# Patient Record
Sex: Male | Born: 1935
Health system: Southern US, Community
[De-identification: ages and names within clinical notes are randomized; demographics above are authoritative.]

## PROBLEM LIST (undated history)

## (undated) DIAGNOSIS — Z974 Presence of external hearing-aid: Secondary | ICD-10-CM

## (undated) DIAGNOSIS — I447 Left bundle-branch block, unspecified: Secondary | ICD-10-CM

## (undated) DIAGNOSIS — K219 Gastro-esophageal reflux disease without esophagitis: Secondary | ICD-10-CM

## (undated) DIAGNOSIS — R339 Retention of urine, unspecified: Secondary | ICD-10-CM

## (undated) DIAGNOSIS — I517 Cardiomegaly: Secondary | ICD-10-CM

## (undated) DIAGNOSIS — N200 Calculus of kidney: Secondary | ICD-10-CM

## (undated) DIAGNOSIS — E785 Hyperlipidemia, unspecified: Secondary | ICD-10-CM

## (undated) DIAGNOSIS — N400 Enlarged prostate without lower urinary tract symptoms: Secondary | ICD-10-CM

## (undated) DIAGNOSIS — F419 Anxiety disorder, unspecified: Secondary | ICD-10-CM

## (undated) DIAGNOSIS — M1990 Unspecified osteoarthritis, unspecified site: Secondary | ICD-10-CM

## (undated) DIAGNOSIS — F329 Major depressive disorder, single episode, unspecified: Secondary | ICD-10-CM

## (undated) DIAGNOSIS — E119 Type 2 diabetes mellitus without complications: Secondary | ICD-10-CM

## (undated) DIAGNOSIS — F988 Other specified behavioral and emotional disorders with onset usually occurring in childhood and adolescence: Secondary | ICD-10-CM

## (undated) DIAGNOSIS — N185 Chronic kidney disease, stage 5: Secondary | ICD-10-CM

## (undated) DIAGNOSIS — I519 Heart disease, unspecified: Secondary | ICD-10-CM

## (undated) DIAGNOSIS — R351 Nocturia: Secondary | ICD-10-CM

## (undated) DIAGNOSIS — F32A Depression, unspecified: Secondary | ICD-10-CM

## (undated) DIAGNOSIS — I10 Essential (primary) hypertension: Secondary | ICD-10-CM

## (undated) DIAGNOSIS — R600 Localized edema: Secondary | ICD-10-CM

## (undated) HISTORY — DX: Other specified behavioral and emotional disorders with onset usually occurring in childhood and adolescence: F98.8

## (undated) HISTORY — DX: Presence of external hearing-aid: Z97.4

## (undated) HISTORY — DX: Nocturia: R35.1

## (undated) HISTORY — PX: JOINT REPLACEMENT: SHX530

## (undated) HISTORY — DX: Benign prostatic hyperplasia without lower urinary tract symptoms: N40.0

## (undated) HISTORY — DX: Calculus of kidney: N20.0

---

## 1992-10-13 HISTORY — PX: OTHER SURGICAL HISTORY: SHX169

## 2016-11-07 DIAGNOSIS — R5383 Other fatigue: Secondary | ICD-10-CM | POA: Diagnosis not present

## 2016-11-07 DIAGNOSIS — I119 Hypertensive heart disease without heart failure: Secondary | ICD-10-CM | POA: Diagnosis not present

## 2016-11-07 DIAGNOSIS — E7800 Pure hypercholesterolemia, unspecified: Secondary | ICD-10-CM | POA: Diagnosis not present

## 2016-11-07 DIAGNOSIS — I6523 Occlusion and stenosis of bilateral carotid arteries: Secondary | ICD-10-CM | POA: Diagnosis not present

## 2016-11-07 DIAGNOSIS — I429 Cardiomyopathy, unspecified: Secondary | ICD-10-CM | POA: Diagnosis not present

## 2016-11-07 DIAGNOSIS — I350 Nonrheumatic aortic (valve) stenosis: Secondary | ICD-10-CM | POA: Diagnosis not present

## 2016-11-07 DIAGNOSIS — I10 Essential (primary) hypertension: Secondary | ICD-10-CM | POA: Diagnosis not present

## 2016-11-24 DIAGNOSIS — B351 Tinea unguium: Secondary | ICD-10-CM | POA: Diagnosis not present

## 2016-11-24 DIAGNOSIS — M79674 Pain in right toe(s): Secondary | ICD-10-CM | POA: Diagnosis not present

## 2016-11-24 DIAGNOSIS — M79675 Pain in left toe(s): Secondary | ICD-10-CM | POA: Diagnosis not present

## 2016-12-17 DIAGNOSIS — I119 Hypertensive heart disease without heart failure: Secondary | ICD-10-CM | POA: Diagnosis not present

## 2016-12-17 DIAGNOSIS — I6523 Occlusion and stenosis of bilateral carotid arteries: Secondary | ICD-10-CM | POA: Diagnosis not present

## 2016-12-17 DIAGNOSIS — R609 Edema, unspecified: Secondary | ICD-10-CM | POA: Diagnosis not present

## 2016-12-17 DIAGNOSIS — E7800 Pure hypercholesterolemia, unspecified: Secondary | ICD-10-CM | POA: Diagnosis not present

## 2016-12-17 DIAGNOSIS — I10 Essential (primary) hypertension: Secondary | ICD-10-CM | POA: Diagnosis not present

## 2016-12-17 DIAGNOSIS — E782 Mixed hyperlipidemia: Secondary | ICD-10-CM | POA: Diagnosis not present

## 2016-12-17 DIAGNOSIS — I350 Nonrheumatic aortic (valve) stenosis: Secondary | ICD-10-CM | POA: Diagnosis not present

## 2016-12-23 DIAGNOSIS — L570 Actinic keratosis: Secondary | ICD-10-CM | POA: Diagnosis not present

## 2016-12-23 DIAGNOSIS — D485 Neoplasm of uncertain behavior of skin: Secondary | ICD-10-CM | POA: Diagnosis not present

## 2017-01-05 DIAGNOSIS — L570 Actinic keratosis: Secondary | ICD-10-CM | POA: Diagnosis not present

## 2017-01-05 DIAGNOSIS — L299 Pruritus, unspecified: Secondary | ICD-10-CM | POA: Diagnosis not present

## 2017-01-05 DIAGNOSIS — L539 Erythematous condition, unspecified: Secondary | ICD-10-CM | POA: Diagnosis not present

## 2017-01-08 DIAGNOSIS — I119 Hypertensive heart disease without heart failure: Secondary | ICD-10-CM | POA: Diagnosis not present

## 2017-01-08 DIAGNOSIS — E119 Type 2 diabetes mellitus without complications: Secondary | ICD-10-CM | POA: Diagnosis not present

## 2017-01-08 DIAGNOSIS — Z6827 Body mass index (BMI) 27.0-27.9, adult: Secondary | ICD-10-CM | POA: Diagnosis not present

## 2017-01-08 DIAGNOSIS — I350 Nonrheumatic aortic (valve) stenosis: Secondary | ICD-10-CM | POA: Diagnosis not present

## 2017-01-08 DIAGNOSIS — I6523 Occlusion and stenosis of bilateral carotid arteries: Secondary | ICD-10-CM | POA: Diagnosis not present

## 2017-01-08 DIAGNOSIS — Z0001 Encounter for general adult medical examination with abnormal findings: Secondary | ICD-10-CM | POA: Diagnosis not present

## 2017-01-08 DIAGNOSIS — F331 Major depressive disorder, recurrent, moderate: Secondary | ICD-10-CM | POA: Diagnosis not present

## 2017-01-08 DIAGNOSIS — I10 Essential (primary) hypertension: Secondary | ICD-10-CM | POA: Diagnosis not present

## 2017-02-02 DIAGNOSIS — B078 Other viral warts: Secondary | ICD-10-CM | POA: Diagnosis not present

## 2017-02-02 DIAGNOSIS — M79671 Pain in right foot: Secondary | ICD-10-CM | POA: Diagnosis not present

## 2017-02-10 DIAGNOSIS — L570 Actinic keratosis: Secondary | ICD-10-CM | POA: Diagnosis not present

## 2017-04-13 DIAGNOSIS — M79675 Pain in left toe(s): Secondary | ICD-10-CM | POA: Diagnosis not present

## 2017-04-13 DIAGNOSIS — M79674 Pain in right toe(s): Secondary | ICD-10-CM | POA: Diagnosis not present

## 2017-04-13 DIAGNOSIS — B351 Tinea unguium: Secondary | ICD-10-CM | POA: Diagnosis not present

## 2017-04-13 DIAGNOSIS — I7389 Other specified peripheral vascular diseases: Secondary | ICD-10-CM | POA: Diagnosis not present

## 2017-04-22 DIAGNOSIS — I6523 Occlusion and stenosis of bilateral carotid arteries: Secondary | ICD-10-CM | POA: Diagnosis not present

## 2017-04-29 DIAGNOSIS — I6523 Occlusion and stenosis of bilateral carotid arteries: Secondary | ICD-10-CM | POA: Diagnosis not present

## 2017-04-29 DIAGNOSIS — I10 Essential (primary) hypertension: Secondary | ICD-10-CM | POA: Diagnosis not present

## 2017-04-29 DIAGNOSIS — E7800 Pure hypercholesterolemia, unspecified: Secondary | ICD-10-CM | POA: Diagnosis not present

## 2017-04-29 DIAGNOSIS — R0602 Shortness of breath: Secondary | ICD-10-CM | POA: Diagnosis not present

## 2017-04-29 DIAGNOSIS — I119 Hypertensive heart disease without heart failure: Secondary | ICD-10-CM | POA: Diagnosis not present

## 2017-04-29 DIAGNOSIS — I350 Nonrheumatic aortic (valve) stenosis: Secondary | ICD-10-CM | POA: Diagnosis not present

## 2017-04-29 DIAGNOSIS — Z6827 Body mass index (BMI) 27.0-27.9, adult: Secondary | ICD-10-CM | POA: Diagnosis not present

## 2017-04-29 DIAGNOSIS — R5383 Other fatigue: Secondary | ICD-10-CM | POA: Diagnosis not present

## 2017-04-30 DIAGNOSIS — I10 Essential (primary) hypertension: Secondary | ICD-10-CM | POA: Diagnosis not present

## 2017-04-30 DIAGNOSIS — R5383 Other fatigue: Secondary | ICD-10-CM | POA: Diagnosis not present

## 2017-04-30 DIAGNOSIS — E7800 Pure hypercholesterolemia, unspecified: Secondary | ICD-10-CM | POA: Diagnosis not present

## 2017-05-13 DIAGNOSIS — I10 Essential (primary) hypertension: Secondary | ICD-10-CM | POA: Diagnosis not present

## 2017-05-13 DIAGNOSIS — I350 Nonrheumatic aortic (valve) stenosis: Secondary | ICD-10-CM | POA: Diagnosis not present

## 2017-05-13 DIAGNOSIS — Z79899 Other long term (current) drug therapy: Secondary | ICD-10-CM | POA: Diagnosis not present

## 2017-05-13 DIAGNOSIS — I7389 Other specified peripheral vascular diseases: Secondary | ICD-10-CM | POA: Diagnosis not present

## 2017-05-13 DIAGNOSIS — I739 Peripheral vascular disease, unspecified: Secondary | ICD-10-CM | POA: Diagnosis not present

## 2017-05-13 DIAGNOSIS — I352 Nonrheumatic aortic (valve) stenosis with insufficiency: Secondary | ICD-10-CM | POA: Diagnosis not present

## 2017-05-13 DIAGNOSIS — R9439 Abnormal result of other cardiovascular function study: Secondary | ICD-10-CM | POA: Diagnosis not present

## 2017-06-03 DIAGNOSIS — I350 Nonrheumatic aortic (valve) stenosis: Secondary | ICD-10-CM | POA: Diagnosis not present

## 2017-06-08 DIAGNOSIS — Z6827 Body mass index (BMI) 27.0-27.9, adult: Secondary | ICD-10-CM | POA: Diagnosis not present

## 2017-06-08 DIAGNOSIS — I119 Hypertensive heart disease without heart failure: Secondary | ICD-10-CM | POA: Diagnosis not present

## 2017-06-08 DIAGNOSIS — R5383 Other fatigue: Secondary | ICD-10-CM | POA: Diagnosis not present

## 2017-06-08 DIAGNOSIS — I350 Nonrheumatic aortic (valve) stenosis: Secondary | ICD-10-CM | POA: Diagnosis not present

## 2017-06-08 DIAGNOSIS — I6523 Occlusion and stenosis of bilateral carotid arteries: Secondary | ICD-10-CM | POA: Diagnosis not present

## 2017-06-08 DIAGNOSIS — I340 Nonrheumatic mitral (valve) insufficiency: Secondary | ICD-10-CM | POA: Diagnosis not present

## 2017-06-17 DIAGNOSIS — Z87891 Personal history of nicotine dependence: Secondary | ICD-10-CM | POA: Diagnosis not present

## 2017-06-17 DIAGNOSIS — I350 Nonrheumatic aortic (valve) stenosis: Secondary | ICD-10-CM | POA: Diagnosis not present

## 2017-06-17 DIAGNOSIS — Z01810 Encounter for preprocedural cardiovascular examination: Secondary | ICD-10-CM | POA: Diagnosis not present

## 2017-06-17 DIAGNOSIS — Z01812 Encounter for preprocedural laboratory examination: Secondary | ICD-10-CM | POA: Diagnosis not present

## 2017-06-22 DIAGNOSIS — M79675 Pain in left toe(s): Secondary | ICD-10-CM | POA: Diagnosis not present

## 2017-06-22 DIAGNOSIS — B351 Tinea unguium: Secondary | ICD-10-CM | POA: Diagnosis not present

## 2017-06-22 DIAGNOSIS — M79674 Pain in right toe(s): Secondary | ICD-10-CM | POA: Diagnosis not present

## 2017-06-22 DIAGNOSIS — I7389 Other specified peripheral vascular diseases: Secondary | ICD-10-CM | POA: Diagnosis not present

## 2017-06-26 DIAGNOSIS — R739 Hyperglycemia, unspecified: Secondary | ICD-10-CM | POA: Diagnosis not present

## 2017-06-26 DIAGNOSIS — F418 Other specified anxiety disorders: Secondary | ICD-10-CM | POA: Diagnosis present

## 2017-06-26 DIAGNOSIS — F329 Major depressive disorder, single episode, unspecified: Secondary | ICD-10-CM | POA: Diagnosis not present

## 2017-06-26 DIAGNOSIS — N400 Enlarged prostate without lower urinary tract symptoms: Secondary | ICD-10-CM | POA: Diagnosis present

## 2017-06-26 DIAGNOSIS — I700 Atherosclerosis of aorta: Secondary | ICD-10-CM | POA: Diagnosis present

## 2017-06-26 DIAGNOSIS — F988 Other specified behavioral and emotional disorders with onset usually occurring in childhood and adolescence: Secondary | ICD-10-CM | POA: Diagnosis present

## 2017-06-26 DIAGNOSIS — Z87891 Personal history of nicotine dependence: Secondary | ICD-10-CM | POA: Diagnosis not present

## 2017-06-26 DIAGNOSIS — Z006 Encounter for examination for normal comparison and control in clinical research program: Secondary | ICD-10-CM | POA: Diagnosis not present

## 2017-06-26 DIAGNOSIS — I350 Nonrheumatic aortic (valve) stenosis: Secondary | ICD-10-CM | POA: Diagnosis present

## 2017-06-26 DIAGNOSIS — R079 Chest pain, unspecified: Secondary | ICD-10-CM | POA: Diagnosis not present

## 2017-06-26 DIAGNOSIS — R001 Bradycardia, unspecified: Secondary | ICD-10-CM | POA: Diagnosis not present

## 2017-06-26 DIAGNOSIS — E785 Hyperlipidemia, unspecified: Secondary | ICD-10-CM | POA: Diagnosis present

## 2017-06-26 DIAGNOSIS — I10 Essential (primary) hypertension: Secondary | ICD-10-CM | POA: Diagnosis present

## 2017-06-26 DIAGNOSIS — Z96641 Presence of right artificial hip joint: Secondary | ICD-10-CM | POA: Diagnosis present

## 2017-06-26 DIAGNOSIS — I509 Heart failure, unspecified: Secondary | ICD-10-CM | POA: Diagnosis not present

## 2017-06-26 DIAGNOSIS — J811 Chronic pulmonary edema: Secondary | ICD-10-CM | POA: Diagnosis not present

## 2017-06-26 DIAGNOSIS — I447 Left bundle-branch block, unspecified: Secondary | ICD-10-CM | POA: Diagnosis not present

## 2017-06-26 DIAGNOSIS — H9193 Unspecified hearing loss, bilateral: Secondary | ICD-10-CM | POA: Diagnosis present

## 2017-06-26 DIAGNOSIS — I110 Hypertensive heart disease with heart failure: Secondary | ICD-10-CM | POA: Diagnosis not present

## 2017-06-26 HISTORY — PX: AORTIC VALVE REPLACEMENT: SHX41

## 2017-07-07 DIAGNOSIS — Z952 Presence of prosthetic heart valve: Secondary | ICD-10-CM | POA: Diagnosis not present

## 2017-07-07 DIAGNOSIS — Z6826 Body mass index (BMI) 26.0-26.9, adult: Secondary | ICD-10-CM | POA: Diagnosis not present

## 2017-07-07 DIAGNOSIS — I119 Hypertensive heart disease without heart failure: Secondary | ICD-10-CM | POA: Diagnosis not present

## 2017-07-07 DIAGNOSIS — E7800 Pure hypercholesterolemia, unspecified: Secondary | ICD-10-CM | POA: Diagnosis not present

## 2017-07-07 DIAGNOSIS — I350 Nonrheumatic aortic (valve) stenosis: Secondary | ICD-10-CM | POA: Diagnosis not present

## 2017-07-10 DIAGNOSIS — I119 Hypertensive heart disease without heart failure: Secondary | ICD-10-CM | POA: Diagnosis not present

## 2017-07-10 DIAGNOSIS — I350 Nonrheumatic aortic (valve) stenosis: Secondary | ICD-10-CM | POA: Diagnosis not present

## 2017-07-10 DIAGNOSIS — Z6826 Body mass index (BMI) 26.0-26.9, adult: Secondary | ICD-10-CM | POA: Diagnosis not present

## 2017-07-10 DIAGNOSIS — R5383 Other fatigue: Secondary | ICD-10-CM | POA: Diagnosis not present

## 2017-07-10 DIAGNOSIS — Z952 Presence of prosthetic heart valve: Secondary | ICD-10-CM | POA: Diagnosis not present

## 2017-07-10 DIAGNOSIS — I340 Nonrheumatic mitral (valve) insufficiency: Secondary | ICD-10-CM | POA: Diagnosis not present

## 2017-07-10 DIAGNOSIS — E7800 Pure hypercholesterolemia, unspecified: Secondary | ICD-10-CM | POA: Diagnosis not present

## 2017-07-10 DIAGNOSIS — N189 Chronic kidney disease, unspecified: Secondary | ICD-10-CM | POA: Diagnosis not present

## 2017-07-27 DIAGNOSIS — Z125 Encounter for screening for malignant neoplasm of prostate: Secondary | ICD-10-CM | POA: Diagnosis not present

## 2017-07-29 DIAGNOSIS — R3912 Poor urinary stream: Secondary | ICD-10-CM | POA: Diagnosis not present

## 2017-07-29 DIAGNOSIS — K5900 Constipation, unspecified: Secondary | ICD-10-CM | POA: Diagnosis not present

## 2017-07-29 DIAGNOSIS — Z125 Encounter for screening for malignant neoplasm of prostate: Secondary | ICD-10-CM | POA: Diagnosis not present

## 2017-07-30 DIAGNOSIS — I340 Nonrheumatic mitral (valve) insufficiency: Secondary | ICD-10-CM | POA: Diagnosis not present

## 2017-07-30 DIAGNOSIS — I350 Nonrheumatic aortic (valve) stenosis: Secondary | ICD-10-CM | POA: Diagnosis not present

## 2017-07-30 DIAGNOSIS — R0602 Shortness of breath: Secondary | ICD-10-CM | POA: Diagnosis not present

## 2017-07-30 DIAGNOSIS — R079 Chest pain, unspecified: Secondary | ICD-10-CM | POA: Diagnosis not present

## 2017-08-12 DIAGNOSIS — R3912 Poor urinary stream: Secondary | ICD-10-CM | POA: Diagnosis not present

## 2017-08-12 DIAGNOSIS — Z125 Encounter for screening for malignant neoplasm of prostate: Secondary | ICD-10-CM | POA: Diagnosis not present

## 2017-08-12 DIAGNOSIS — N401 Enlarged prostate with lower urinary tract symptoms: Secondary | ICD-10-CM | POA: Diagnosis not present

## 2017-08-17 DIAGNOSIS — I350 Nonrheumatic aortic (valve) stenosis: Secondary | ICD-10-CM | POA: Diagnosis not present

## 2017-08-17 DIAGNOSIS — Z6826 Body mass index (BMI) 26.0-26.9, adult: Secondary | ICD-10-CM | POA: Diagnosis not present

## 2017-08-17 DIAGNOSIS — Z952 Presence of prosthetic heart valve: Secondary | ICD-10-CM | POA: Diagnosis not present

## 2017-08-17 DIAGNOSIS — I119 Hypertensive heart disease without heart failure: Secondary | ICD-10-CM | POA: Diagnosis not present

## 2017-08-17 DIAGNOSIS — E7800 Pure hypercholesterolemia, unspecified: Secondary | ICD-10-CM | POA: Diagnosis not present

## 2017-08-17 DIAGNOSIS — I340 Nonrheumatic mitral (valve) insufficiency: Secondary | ICD-10-CM | POA: Diagnosis not present

## 2017-08-17 DIAGNOSIS — R0602 Shortness of breath: Secondary | ICD-10-CM | POA: Diagnosis not present

## 2017-08-17 DIAGNOSIS — I6523 Occlusion and stenosis of bilateral carotid arteries: Secondary | ICD-10-CM | POA: Diagnosis not present

## 2017-08-17 DIAGNOSIS — N189 Chronic kidney disease, unspecified: Secondary | ICD-10-CM | POA: Diagnosis not present

## 2017-09-14 DIAGNOSIS — B351 Tinea unguium: Secondary | ICD-10-CM | POA: Diagnosis not present

## 2017-09-14 DIAGNOSIS — M79674 Pain in right toe(s): Secondary | ICD-10-CM | POA: Diagnosis not present

## 2017-09-14 DIAGNOSIS — M79675 Pain in left toe(s): Secondary | ICD-10-CM | POA: Diagnosis not present

## 2017-10-15 DIAGNOSIS — E782 Mixed hyperlipidemia: Secondary | ICD-10-CM | POA: Diagnosis not present

## 2017-10-15 DIAGNOSIS — Z6826 Body mass index (BMI) 26.0-26.9, adult: Secondary | ICD-10-CM | POA: Diagnosis not present

## 2017-10-15 DIAGNOSIS — I10 Essential (primary) hypertension: Secondary | ICD-10-CM | POA: Diagnosis not present

## 2017-10-15 DIAGNOSIS — R296 Repeated falls: Secondary | ICD-10-CM | POA: Diagnosis not present

## 2017-10-15 DIAGNOSIS — E119 Type 2 diabetes mellitus without complications: Secondary | ICD-10-CM | POA: Diagnosis not present

## 2017-11-13 DIAGNOSIS — E119 Type 2 diabetes mellitus without complications: Secondary | ICD-10-CM | POA: Diagnosis not present

## 2017-11-13 DIAGNOSIS — R2689 Other abnormalities of gait and mobility: Secondary | ICD-10-CM | POA: Diagnosis not present

## 2017-11-16 DIAGNOSIS — E119 Type 2 diabetes mellitus without complications: Secondary | ICD-10-CM | POA: Diagnosis not present

## 2017-11-16 DIAGNOSIS — R2689 Other abnormalities of gait and mobility: Secondary | ICD-10-CM | POA: Diagnosis not present

## 2017-11-18 DIAGNOSIS — R2689 Other abnormalities of gait and mobility: Secondary | ICD-10-CM | POA: Diagnosis not present

## 2017-11-18 DIAGNOSIS — E119 Type 2 diabetes mellitus without complications: Secondary | ICD-10-CM | POA: Diagnosis not present

## 2017-11-20 DIAGNOSIS — I10 Essential (primary) hypertension: Secondary | ICD-10-CM | POA: Diagnosis not present

## 2017-11-20 DIAGNOSIS — Z6826 Body mass index (BMI) 26.0-26.9, adult: Secondary | ICD-10-CM | POA: Diagnosis not present

## 2017-11-20 DIAGNOSIS — E119 Type 2 diabetes mellitus without complications: Secondary | ICD-10-CM | POA: Diagnosis not present

## 2017-11-20 DIAGNOSIS — F3340 Major depressive disorder, recurrent, in remission, unspecified: Secondary | ICD-10-CM | POA: Diagnosis not present

## 2017-11-20 DIAGNOSIS — E782 Mixed hyperlipidemia: Secondary | ICD-10-CM | POA: Diagnosis not present

## 2017-11-20 DIAGNOSIS — Z952 Presence of prosthetic heart valve: Secondary | ICD-10-CM | POA: Diagnosis not present

## 2017-11-23 DIAGNOSIS — M79675 Pain in left toe(s): Secondary | ICD-10-CM | POA: Diagnosis not present

## 2017-11-23 DIAGNOSIS — E119 Type 2 diabetes mellitus without complications: Secondary | ICD-10-CM | POA: Diagnosis not present

## 2017-11-23 DIAGNOSIS — Z713 Dietary counseling and surveillance: Secondary | ICD-10-CM | POA: Diagnosis not present

## 2017-11-23 DIAGNOSIS — B351 Tinea unguium: Secondary | ICD-10-CM | POA: Diagnosis not present

## 2017-11-23 DIAGNOSIS — R2689 Other abnormalities of gait and mobility: Secondary | ICD-10-CM | POA: Diagnosis not present

## 2017-11-23 DIAGNOSIS — M79674 Pain in right toe(s): Secondary | ICD-10-CM | POA: Diagnosis not present

## 2017-11-23 DIAGNOSIS — D485 Neoplasm of uncertain behavior of skin: Secondary | ICD-10-CM | POA: Diagnosis not present

## 2017-11-23 DIAGNOSIS — I7389 Other specified peripheral vascular diseases: Secondary | ICD-10-CM | POA: Diagnosis not present

## 2017-11-25 DIAGNOSIS — R2689 Other abnormalities of gait and mobility: Secondary | ICD-10-CM | POA: Diagnosis not present

## 2017-11-25 DIAGNOSIS — E119 Type 2 diabetes mellitus without complications: Secondary | ICD-10-CM | POA: Diagnosis not present

## 2017-12-02 DIAGNOSIS — R2689 Other abnormalities of gait and mobility: Secondary | ICD-10-CM | POA: Diagnosis not present

## 2017-12-02 DIAGNOSIS — E119 Type 2 diabetes mellitus without complications: Secondary | ICD-10-CM | POA: Diagnosis not present

## 2017-12-02 DIAGNOSIS — L570 Actinic keratosis: Secondary | ICD-10-CM | POA: Diagnosis not present

## 2017-12-21 DIAGNOSIS — Z48817 Encounter for surgical aftercare following surgery on the skin and subcutaneous tissue: Secondary | ICD-10-CM | POA: Diagnosis not present

## 2018-01-15 DIAGNOSIS — N401 Enlarged prostate with lower urinary tract symptoms: Secondary | ICD-10-CM | POA: Diagnosis not present

## 2018-01-15 DIAGNOSIS — Z952 Presence of prosthetic heart valve: Secondary | ICD-10-CM | POA: Diagnosis not present

## 2018-01-15 DIAGNOSIS — Z974 Presence of external hearing-aid: Secondary | ICD-10-CM | POA: Diagnosis not present

## 2018-01-15 DIAGNOSIS — E785 Hyperlipidemia, unspecified: Secondary | ICD-10-CM | POA: Diagnosis not present

## 2018-01-15 DIAGNOSIS — R81 Glycosuria: Secondary | ICD-10-CM | POA: Diagnosis not present

## 2018-01-15 DIAGNOSIS — R296 Repeated falls: Secondary | ICD-10-CM | POA: Diagnosis not present

## 2018-01-15 DIAGNOSIS — H6123 Impacted cerumen, bilateral: Secondary | ICD-10-CM | POA: Diagnosis not present

## 2018-01-15 DIAGNOSIS — F988 Other specified behavioral and emotional disorders with onset usually occurring in childhood and adolescence: Secondary | ICD-10-CM | POA: Diagnosis not present

## 2018-01-21 DIAGNOSIS — Z952 Presence of prosthetic heart valve: Secondary | ICD-10-CM | POA: Diagnosis not present

## 2018-01-21 DIAGNOSIS — N401 Enlarged prostate with lower urinary tract symptoms: Secondary | ICD-10-CM | POA: Diagnosis not present

## 2018-01-21 DIAGNOSIS — R260 Ataxic gait: Secondary | ICD-10-CM | POA: Diagnosis not present

## 2018-01-21 DIAGNOSIS — E119 Type 2 diabetes mellitus without complications: Secondary | ICD-10-CM | POA: Diagnosis not present

## 2018-01-21 DIAGNOSIS — Z7984 Long term (current) use of oral hypoglycemic drugs: Secondary | ICD-10-CM | POA: Diagnosis not present

## 2018-01-21 DIAGNOSIS — R296 Repeated falls: Secondary | ICD-10-CM | POA: Diagnosis not present

## 2018-01-21 DIAGNOSIS — F988 Other specified behavioral and emotional disorders with onset usually occurring in childhood and adolescence: Secondary | ICD-10-CM | POA: Diagnosis not present

## 2018-01-21 DIAGNOSIS — R351 Nocturia: Secondary | ICD-10-CM | POA: Diagnosis not present

## 2018-01-25 DIAGNOSIS — E119 Type 2 diabetes mellitus without complications: Secondary | ICD-10-CM | POA: Diagnosis not present

## 2018-01-25 DIAGNOSIS — N401 Enlarged prostate with lower urinary tract symptoms: Secondary | ICD-10-CM | POA: Diagnosis not present

## 2018-01-25 DIAGNOSIS — F988 Other specified behavioral and emotional disorders with onset usually occurring in childhood and adolescence: Secondary | ICD-10-CM | POA: Diagnosis not present

## 2018-01-25 DIAGNOSIS — R260 Ataxic gait: Secondary | ICD-10-CM | POA: Diagnosis not present

## 2018-01-25 DIAGNOSIS — R296 Repeated falls: Secondary | ICD-10-CM | POA: Diagnosis not present

## 2018-01-25 DIAGNOSIS — R351 Nocturia: Secondary | ICD-10-CM | POA: Diagnosis not present

## 2018-01-27 DIAGNOSIS — E119 Type 2 diabetes mellitus without complications: Secondary | ICD-10-CM | POA: Diagnosis not present

## 2018-01-27 DIAGNOSIS — R296 Repeated falls: Secondary | ICD-10-CM | POA: Diagnosis not present

## 2018-01-27 DIAGNOSIS — F988 Other specified behavioral and emotional disorders with onset usually occurring in childhood and adolescence: Secondary | ICD-10-CM | POA: Diagnosis not present

## 2018-01-27 DIAGNOSIS — R260 Ataxic gait: Secondary | ICD-10-CM | POA: Diagnosis not present

## 2018-01-27 DIAGNOSIS — R351 Nocturia: Secondary | ICD-10-CM | POA: Diagnosis not present

## 2018-01-27 DIAGNOSIS — N401 Enlarged prostate with lower urinary tract symptoms: Secondary | ICD-10-CM | POA: Diagnosis not present

## 2018-01-29 DIAGNOSIS — R319 Hematuria, unspecified: Secondary | ICD-10-CM | POA: Insufficient documentation

## 2018-01-29 DIAGNOSIS — Z952 Presence of prosthetic heart valve: Secondary | ICD-10-CM

## 2018-01-29 DIAGNOSIS — W19XXXA Unspecified fall, initial encounter: Secondary | ICD-10-CM | POA: Insufficient documentation

## 2018-01-29 DIAGNOSIS — Z09 Encounter for follow-up examination after completed treatment for conditions other than malignant neoplasm: Secondary | ICD-10-CM | POA: Insufficient documentation

## 2018-02-01 DIAGNOSIS — R351 Nocturia: Secondary | ICD-10-CM | POA: Diagnosis not present

## 2018-02-01 DIAGNOSIS — F988 Other specified behavioral and emotional disorders with onset usually occurring in childhood and adolescence: Secondary | ICD-10-CM | POA: Diagnosis not present

## 2018-02-01 DIAGNOSIS — E119 Type 2 diabetes mellitus without complications: Secondary | ICD-10-CM | POA: Diagnosis not present

## 2018-02-01 DIAGNOSIS — R260 Ataxic gait: Secondary | ICD-10-CM | POA: Diagnosis not present

## 2018-02-01 DIAGNOSIS — N401 Enlarged prostate with lower urinary tract symptoms: Secondary | ICD-10-CM | POA: Diagnosis not present

## 2018-02-01 DIAGNOSIS — R296 Repeated falls: Secondary | ICD-10-CM | POA: Diagnosis not present

## 2018-02-03 DIAGNOSIS — R351 Nocturia: Secondary | ICD-10-CM | POA: Diagnosis not present

## 2018-02-03 DIAGNOSIS — R296 Repeated falls: Secondary | ICD-10-CM | POA: Diagnosis not present

## 2018-02-03 DIAGNOSIS — R260 Ataxic gait: Secondary | ICD-10-CM | POA: Diagnosis not present

## 2018-02-03 DIAGNOSIS — E119 Type 2 diabetes mellitus without complications: Secondary | ICD-10-CM | POA: Diagnosis not present

## 2018-02-03 DIAGNOSIS — F988 Other specified behavioral and emotional disorders with onset usually occurring in childhood and adolescence: Secondary | ICD-10-CM | POA: Diagnosis not present

## 2018-02-03 DIAGNOSIS — N401 Enlarged prostate with lower urinary tract symptoms: Secondary | ICD-10-CM | POA: Diagnosis not present

## 2018-02-04 ENCOUNTER — Ambulatory Visit (INDEPENDENT_AMBULATORY_CARE_PROVIDER_SITE_OTHER): Payer: Medicare Other | Admitting: Cardiology

## 2018-02-04 ENCOUNTER — Encounter: Payer: Self-pay | Admitting: Cardiology

## 2018-02-04 VITALS — BP 132/70 | HR 61 | Ht 67.0 in | Wt 175.0 lb

## 2018-02-04 DIAGNOSIS — Z953 Presence of xenogenic heart valve: Secondary | ICD-10-CM

## 2018-02-04 DIAGNOSIS — R600 Localized edema: Secondary | ICD-10-CM

## 2018-02-04 NOTE — Progress Notes (Signed)
Cardiology Office Note:    Date:  02/04/2018   ID:  [name], DOB 10/20/1925, MRN [id number]  PCP:  [facility]  Cardiologist:  Jenean Lindau, MD   Referring MD: Starlyn Skeans, PA-C    ASSESSMENT:    1. S/p TAVR (transcatheter aortic valve replacement), bioprosthetic   2. Pedal edema    PLAN:    In order of problems listed above:  1. Overall patient is doing fine post TAVR.  He has moved here from the wants to get established.  He mentions to me that he was doctors wanted a follow-up echocardiogram to assess the aortic management we will obtain this.  He also has bilateral pedal edema left greater than the right and therefore we will do a DVT study though he says this is been going on for the past 2 or 3 years.  His blood pressure is stable and I discussed diabetes mellitus.  Fall precautions were advised.  6 months.   Medication Adjustments/Labs and Tests Ordered: Current medicines are reviewed at length with the patient today.  Concerns regarding medicines are outlined above.  Orders Placed This Encounter  Procedures  . ECHOCARDIOGRAM COMPLETE   No orders of the defined types were placed in this encounter.    History of Present Illness:    Trampus Mcquerry is a 84 y.o. male who is being seen today for the evaluation of post TAVR at the request of [place], [place], [place].  The patient wishes to be established and is brought in by his stepson.  Patient denies any problems at this time and leads a sedentary lifestyle.  He is just moved here from [place].  He has issues with his balance and has had multiple falls.  No chest pain orthopnea or PND.  At the time of my evaluation, the patient is alert awake oriented and in no distress.  Past Medical History:  Diagnosis Date  . ADD (attention deficit disorder)   . BPH (benign prostatic hyperplasia)   . Does use hearing aid   . Kidney stones   . Nocturia     Past Surgical History:  Procedure  Laterality Date  . AORTIC VALVE REPLACEMENT  06/26/2017  . right hip replacement  1994    Current Medications: Current Meds  Medication Sig  . ALPRAZolam (XANAX) 0.25 MG tablet Take 0.25 mg by mouth 2 (two) times daily as needed.  Marland Kitchen aspirin EC 81 MG tablet Take 81 mg by mouth daily.  Marland Kitchen atenolol (TENORMIN) 50 MG tablet Take 50 mg by mouth daily.  . Cyanocobalamin (VITAMIN B-12) 5000 MCG TBDP DISSOLVE ONE TABLET in MOUTH EVERY DAY  . dutasteride (AVODART) 0.5 MG capsule Take 0.5 mg by mouth daily.  . metFORMIN (GLUCOPHAGE) 500 MG tablet Take 1 tablet by mouth 2 (two) times daily with a meal.  . methylphenidate (RITALIN) 20 MG tablet Take 20 mg by mouth 3 (three) times daily.  Marland Kitchen PARoxetine (PAXIL) 40 MG tablet Take 40 mg by mouth every morning.  . simvastatin (ZOCOR) 20 MG tablet Take 20 mg by mouth daily.  Marland Kitchen terazosin (HYTRIN) 10 MG capsule Take 10 mg by mouth at bedtime.     Allergies:   Patient has no known allergies.   Social History   Socioeconomic History  . Marital status: Married    Spouse name: Not on file  . Number of children: Not on file  . Years of education: Not on file  . Highest education level: Not  on file  Occupational History  . Not on file  Social Needs  . Financial resource strain: Not on file  . Food insecurity:    Worry: Not on file    Inability: Not on file  . Transportation needs:    Medical: Not on file    Non-medical: Not on file  Tobacco Use  . Smoking status: Never Smoker  . Smokeless tobacco: Never Used  Substance and Sexual Activity  . Alcohol use: Never    Frequency: Never  . Drug use: Not on file  . Sexual activity: Not on file  Lifestyle  . Physical activity:    Days per week: Not on file    Minutes per session: Not on file  . Stress: Not on file  Relationships  . Social connections:    Talks on phone: Not on file    Gets together: Not on file    Attends religious service: Not on file    Active member of club or organization:  Not on file    Attends meetings of clubs or organizations: Not on file    Relationship status: Not on file  Other Topics Concern  . Not on file  Social History Narrative  . Not on file     Family History: The patient's family history includes Prostate cancer in his father.  ROS:   Please see the history of present illness.    All other systems reviewed and are negative.  EKGs/Labs/Other Studies Reviewed:    The following studies were reviewed today: EKG was unremarkable and he was limp to be in sinus rhythm   Recent Labs: No results found for requested labs within last 8760 hours.  Recent Lipid Panel No results found for: CHOL, TRIG, HDL, CHOLHDL, VLDL, LDLCALC, LDLDIRECT  Physical Exam:    VS:  BP 132/70 (BP Location: Left Arm, Patient Position: Sitting, Cuff Size: Normal)   Pulse 61   Ht 5\' 7"  (1.702 m)   Wt 175 lb (79.4 kg)   SpO2 99%   BMI 27.41 kg/m     Wt Readings from Last 3 Encounters:  02/04/18 175 lb (79.4 kg)     GEN: Patient is in no acute distress HEENT: Normal NECK: No JVD; No carotid bruits LYMPHATICS: No lymphadenopathy CARDIAC: S1 S2 regular, 2/6 systolic murmur at the apex. RESPIRATORY:  Clear to auscultation without rales, wheezing or rhonchi  ABDOMEN: Soft, non-tender, non-distended MUSCULOSKELETAL: Bilateral pedal edema, left greater than the right; No deformity  SKIN: Warm and dry NEUROLOGIC:  Alert and oriented x 3 PSYCHIATRIC:  Normal affect    Signed, Jenean Lindau, MD  02/04/2018 10:56 AM    [place]

## 2018-02-04 NOTE — Patient Instructions (Signed)
Medication Instructions:  Your physician recommends that you continue on your current medications as directed. Please refer to the Current Medication list given to you today.  Labwork: None  Testing/Procedures: Your physician has requested that you have an echocardiogram. Echocardiography is a painless test that uses sound waves to create images of your heart. It provides your doctor with information about the size and shape of your heart and how well your heart's chambers and valves are working. This procedure takes approximately one hour. There are no restrictions for this procedure.  Your physician has requested that you have a lower extremity venous duplex. This test is an ultrasound of the veins in the legs or arms. It looks at venous blood flow that carries blood from the heart to the legs or arms. Allow one hour for a Lower Venous exam. Allow thirty minutes for an Upper Venous exam. There are no restrictions or special instructions.   Follow-Up: Your physician recommends that you schedule a follow-up appointment in: 6 months  Any Other Special Instructions Will Be Listed Below (If Applicable).     If you need a refill on your cardiac medications before your next appointment, please call your pharmacy.   [place], RN, BSN   Vascular Ultrasound An ultrasound, also called sonography or ultrasonography, uses harmless sound waves to take pictures of the inside of your body. The pictures are taken with a device called a transducer that is held up against your body. The continually changing pictures can be recorded on videotape or film. A vascular ultrasound is a painless test to see if you have blood flow problems or clots in your blood vessels. It may be done to look at blood vessels almost anywhere in the body. There are several types of ultrasounds that can be done to look at the blood vessels. They include:  Continuous wave Doppler ultrasound. This type of  ultrasound uses the change in pitch of sound waves to provide information about blood flow through a blood vessel. During the test, a health care provider listens to the sounds produced by the transducer.  Duplex ultrasound. This type of ultrasound uses standard ultrasound methods to produce a picture of a blood vessel and surrounding organs. In addition, a computer provides information about the speed and direction of blood flow through the blood vessel. With this type of ultrasound it is possible to see the structures inside the body and to evaluate blood flow within those structures at the same time.  Color Doppler ultrasound. This type of ultrasound uses standard ultrasound methods to produce a picture of a blood vessel. In addition, a computer converts the Doppler sounds into colors that are overlaid on the picture of the blood vessel. These colors represent the speed and direction of blood flow through the vessel.  Power Doppler ultrasound. This type of ultrasound is up to five times more sensitive than color Doppler ultrasound. Power Doppler ultrasound can also get pictures that are difficult or impossible to get using standard color Doppler ultrasound. Power Doppler ultrasound is most commonly used to evaluate blood flow through vessels within organs, such as the liver or kidneys.  Transcranial Doppler ultrasound. This type of ultrasound looks at blood flow in blood vessels throughout the brain. It can reveal the presence of narrow arteries, clots blocking the vessels, or malformed blood vessels.  What are the risks? There are no known risks or complications of having an ultrasound. What happens before the procedure?  If the  ultrasound scan involves your upper abdomen, you may be directed not to eat, smoke, or chew gum the morning of your exam. Follow your health care provider's instructions.  During the test, a gel will be applied to your skin. Wear clothing that is easily washable in case  the gel gets on your clothes. What happens during the procedure?  A gel will be applied to your skin. It may feel cool.  The transducer will be placed on the area to be examined.  Pictures will be taken. They will be displayed on one or more monitors that look like small television screens. What happens after the procedure?  You can safely drive home and return to regular activities immediately after your exam.  Keep follow-up visits as directed by your health care provider.  Ask when your test results will be ready. It is your responsibility to get your test results. This information is not intended to replace advice given to you by your health care provider. Make sure you discuss any questions you have with your health care provider. Document Released: 10/10/2004 Document Revised: 03/06/2016 Document Reviewed: 12/22/2013 Elsevier Interactive Patient Education  2018 [employer]. Echocardiogram An echocardiogram, or echocardiography, uses sound waves (ultrasound) to produce an image of your heart. The echocardiogram is simple, painless, obtained within a short period of time, and offers valuable information to your health care provider. The images from an echocardiogram can provide information such as:  Evidence of coronary artery disease (CAD).  Heart size.  Heart muscle function.  Heart valve function.  Aneurysm detection.  Evidence of a past heart attack.  Fluid buildup around the heart.  Heart muscle thickening.  Assess heart valve function.  Tell a health care provider about:  Any allergies you have.  All medicines you are taking, including vitamins, herbs, eye drops, creams, and over-the-counter medicines.  Any problems you or family members have had with anesthetic medicines.  Any blood disorders you have.  Any surgeries you have had.  Any medical conditions you have.  Whether you are pregnant or may be pregnant. What happens before the procedure? No  special preparation is needed. Eat and drink normally. What happens during the procedure?  In order to produce an image of your heart, gel will be applied to your chest and a wand-like tool (transducer) will be moved over your chest. The gel will help transmit the sound waves from the transducer. The sound waves will harmlessly bounce off your heart to allow the heart images to be captured in real-time motion. These images will then be recorded.  You may need an IV to receive a medicine that improves the quality of the pictures. What happens after the procedure? You may return to your normal schedule including diet, activities, and medicines, unless your health care provider tells you otherwise. This information is not intended to replace advice given to you by your health care provider. Make sure you discuss any questions you have with your health care provider. Document Released: 09/26/2000 Document Revised: 05/17/2016 Document Reviewed: 06/06/2013 Elsevier Interactive Patient Education  2017 [employer].

## 2018-02-04 NOTE — Addendum Note (Signed)
Addended by: Jerl Santos R on: 02/04/2018 01:27 PM   Modules accepted: Orders

## 2018-02-08 DIAGNOSIS — R260 Ataxic gait: Secondary | ICD-10-CM | POA: Diagnosis not present

## 2018-02-08 DIAGNOSIS — N401 Enlarged prostate with lower urinary tract symptoms: Secondary | ICD-10-CM | POA: Diagnosis not present

## 2018-02-08 DIAGNOSIS — E119 Type 2 diabetes mellitus without complications: Secondary | ICD-10-CM | POA: Diagnosis not present

## 2018-02-08 DIAGNOSIS — F988 Other specified behavioral and emotional disorders with onset usually occurring in childhood and adolescence: Secondary | ICD-10-CM | POA: Diagnosis not present

## 2018-02-08 DIAGNOSIS — R296 Repeated falls: Secondary | ICD-10-CM | POA: Diagnosis not present

## 2018-02-08 DIAGNOSIS — R351 Nocturia: Secondary | ICD-10-CM | POA: Diagnosis not present

## 2018-02-10 ENCOUNTER — Other Ambulatory Visit (HOSPITAL_BASED_OUTPATIENT_CLINIC_OR_DEPARTMENT_OTHER): Payer: Medicare Other

## 2018-02-10 DIAGNOSIS — F988 Other specified behavioral and emotional disorders with onset usually occurring in childhood and adolescence: Secondary | ICD-10-CM | POA: Diagnosis not present

## 2018-02-10 DIAGNOSIS — R600 Localized edema: Secondary | ICD-10-CM

## 2018-02-10 DIAGNOSIS — R296 Repeated falls: Secondary | ICD-10-CM | POA: Diagnosis not present

## 2018-02-10 DIAGNOSIS — E119 Type 2 diabetes mellitus without complications: Secondary | ICD-10-CM | POA: Diagnosis not present

## 2018-02-10 DIAGNOSIS — R260 Ataxic gait: Secondary | ICD-10-CM | POA: Diagnosis not present

## 2018-02-10 DIAGNOSIS — R351 Nocturia: Secondary | ICD-10-CM | POA: Diagnosis not present

## 2018-02-10 DIAGNOSIS — N401 Enlarged prostate with lower urinary tract symptoms: Secondary | ICD-10-CM | POA: Diagnosis not present

## 2018-02-10 HISTORY — DX: Localized edema: R60.0

## 2018-02-15 ENCOUNTER — Ambulatory Visit (HOSPITAL_BASED_OUTPATIENT_CLINIC_OR_DEPARTMENT_OTHER)
Admission: RE | Admit: 2018-02-15 | Discharge: 2018-02-15 | Disposition: A | Discharge status: Home / Self Care | Payer: Medicare Other | Source: Ambulatory Visit | Attending: Cardiology | Admitting: Cardiology

## 2018-02-15 DIAGNOSIS — I517 Cardiomegaly: Secondary | ICD-10-CM

## 2018-02-15 DIAGNOSIS — I059 Rheumatic mitral valve disease, unspecified: Secondary | ICD-10-CM | POA: Diagnosis not present

## 2018-02-15 DIAGNOSIS — R600 Localized edema: Secondary | ICD-10-CM | POA: Insufficient documentation

## 2018-02-15 DIAGNOSIS — I5189 Other ill-defined heart diseases: Secondary | ICD-10-CM

## 2018-02-15 DIAGNOSIS — Z952 Presence of prosthetic heart valve: Secondary | ICD-10-CM | POA: Diagnosis not present

## 2018-02-15 HISTORY — DX: Other ill-defined heart diseases: I51.89

## 2018-02-15 HISTORY — DX: Cardiomegaly: I51.7

## 2018-02-15 NOTE — Progress Notes (Signed)
  Echocardiogram 2D Echocardiogram has been performed.  Joelene Millin 02/15/2018, 1:57 PM

## 2018-02-16 ENCOUNTER — Telehealth: Payer: Self-pay | Admitting: Cardiology

## 2018-02-16 DIAGNOSIS — N401 Enlarged prostate with lower urinary tract symptoms: Secondary | ICD-10-CM | POA: Diagnosis not present

## 2018-02-16 DIAGNOSIS — F988 Other specified behavioral and emotional disorders with onset usually occurring in childhood and adolescence: Secondary | ICD-10-CM | POA: Diagnosis not present

## 2018-02-16 DIAGNOSIS — E119 Type 2 diabetes mellitus without complications: Secondary | ICD-10-CM | POA: Diagnosis not present

## 2018-02-16 DIAGNOSIS — R296 Repeated falls: Secondary | ICD-10-CM | POA: Diagnosis not present

## 2018-02-16 DIAGNOSIS — R351 Nocturia: Secondary | ICD-10-CM | POA: Diagnosis not present

## 2018-02-16 DIAGNOSIS — R260 Ataxic gait: Secondary | ICD-10-CM | POA: Diagnosis not present

## 2018-02-16 NOTE — Telephone Encounter (Signed)
Wants ultrasound results from yesterday

## 2018-02-17 DIAGNOSIS — F988 Other specified behavioral and emotional disorders with onset usually occurring in childhood and adolescence: Secondary | ICD-10-CM | POA: Diagnosis not present

## 2018-02-17 DIAGNOSIS — R351 Nocturia: Secondary | ICD-10-CM | POA: Diagnosis not present

## 2018-02-17 DIAGNOSIS — R260 Ataxic gait: Secondary | ICD-10-CM | POA: Diagnosis not present

## 2018-02-17 DIAGNOSIS — R296 Repeated falls: Secondary | ICD-10-CM | POA: Diagnosis not present

## 2018-02-17 DIAGNOSIS — E119 Type 2 diabetes mellitus without complications: Secondary | ICD-10-CM | POA: Diagnosis not present

## 2018-02-17 DIAGNOSIS — N401 Enlarged prostate with lower urinary tract symptoms: Secondary | ICD-10-CM | POA: Diagnosis not present

## 2018-02-17 NOTE — Telephone Encounter (Signed)
Left voicemail informing patient of the normal echo and lower extremity ultrasound.

## 2018-02-19 ENCOUNTER — Telehealth: Payer: Self-pay

## 2018-02-19 NOTE — Telephone Encounter (Signed)
Wife called regarding recent cardiac testing results. Informed the wife that the patient has good EF and did not show signs of DVT. Wife was encouraged to call the PCP regarding the swelling to the patient's one leg. Results routed to PCP.

## 2018-02-22 DIAGNOSIS — E119 Type 2 diabetes mellitus without complications: Secondary | ICD-10-CM | POA: Diagnosis not present

## 2018-02-22 DIAGNOSIS — R260 Ataxic gait: Secondary | ICD-10-CM | POA: Diagnosis not present

## 2018-02-22 DIAGNOSIS — N401 Enlarged prostate with lower urinary tract symptoms: Secondary | ICD-10-CM | POA: Diagnosis not present

## 2018-02-22 DIAGNOSIS — R351 Nocturia: Secondary | ICD-10-CM | POA: Diagnosis not present

## 2018-02-22 DIAGNOSIS — R296 Repeated falls: Secondary | ICD-10-CM | POA: Diagnosis not present

## 2018-02-22 DIAGNOSIS — F988 Other specified behavioral and emotional disorders with onset usually occurring in childhood and adolescence: Secondary | ICD-10-CM | POA: Diagnosis not present

## 2018-02-24 DIAGNOSIS — R260 Ataxic gait: Secondary | ICD-10-CM | POA: Diagnosis not present

## 2018-02-24 DIAGNOSIS — R351 Nocturia: Secondary | ICD-10-CM | POA: Diagnosis not present

## 2018-02-24 DIAGNOSIS — F988 Other specified behavioral and emotional disorders with onset usually occurring in childhood and adolescence: Secondary | ICD-10-CM | POA: Diagnosis not present

## 2018-02-24 DIAGNOSIS — E119 Type 2 diabetes mellitus without complications: Secondary | ICD-10-CM | POA: Diagnosis not present

## 2018-02-24 DIAGNOSIS — N401 Enlarged prostate with lower urinary tract symptoms: Secondary | ICD-10-CM | POA: Diagnosis not present

## 2018-02-24 DIAGNOSIS — R296 Repeated falls: Secondary | ICD-10-CM | POA: Diagnosis not present

## 2018-03-01 DIAGNOSIS — R296 Repeated falls: Secondary | ICD-10-CM | POA: Diagnosis not present

## 2018-03-01 DIAGNOSIS — E119 Type 2 diabetes mellitus without complications: Secondary | ICD-10-CM | POA: Diagnosis not present

## 2018-03-01 DIAGNOSIS — F988 Other specified behavioral and emotional disorders with onset usually occurring in childhood and adolescence: Secondary | ICD-10-CM | POA: Diagnosis not present

## 2018-03-01 DIAGNOSIS — R260 Ataxic gait: Secondary | ICD-10-CM | POA: Diagnosis not present

## 2018-03-01 DIAGNOSIS — R351 Nocturia: Secondary | ICD-10-CM | POA: Diagnosis not present

## 2018-03-01 DIAGNOSIS — N401 Enlarged prostate with lower urinary tract symptoms: Secondary | ICD-10-CM | POA: Diagnosis not present

## 2018-03-02 ENCOUNTER — Other Ambulatory Visit (HOSPITAL_BASED_OUTPATIENT_CLINIC_OR_DEPARTMENT_OTHER): Payer: Medicare Other

## 2018-03-03 DIAGNOSIS — N401 Enlarged prostate with lower urinary tract symptoms: Secondary | ICD-10-CM | POA: Diagnosis not present

## 2018-03-03 DIAGNOSIS — R351 Nocturia: Secondary | ICD-10-CM | POA: Diagnosis not present

## 2018-03-03 DIAGNOSIS — E119 Type 2 diabetes mellitus without complications: Secondary | ICD-10-CM | POA: Diagnosis not present

## 2018-03-03 DIAGNOSIS — R260 Ataxic gait: Secondary | ICD-10-CM | POA: Diagnosis not present

## 2018-03-03 DIAGNOSIS — F988 Other specified behavioral and emotional disorders with onset usually occurring in childhood and adolescence: Secondary | ICD-10-CM | POA: Diagnosis not present

## 2018-03-03 DIAGNOSIS — R296 Repeated falls: Secondary | ICD-10-CM | POA: Diagnosis not present

## 2018-03-09 DIAGNOSIS — R260 Ataxic gait: Secondary | ICD-10-CM | POA: Diagnosis not present

## 2018-03-09 DIAGNOSIS — R296 Repeated falls: Secondary | ICD-10-CM | POA: Diagnosis not present

## 2018-03-09 DIAGNOSIS — N401 Enlarged prostate with lower urinary tract symptoms: Secondary | ICD-10-CM | POA: Diagnosis not present

## 2018-03-09 DIAGNOSIS — E119 Type 2 diabetes mellitus without complications: Secondary | ICD-10-CM | POA: Diagnosis not present

## 2018-03-09 DIAGNOSIS — F988 Other specified behavioral and emotional disorders with onset usually occurring in childhood and adolescence: Secondary | ICD-10-CM | POA: Diagnosis not present

## 2018-03-09 DIAGNOSIS — R351 Nocturia: Secondary | ICD-10-CM | POA: Diagnosis not present

## 2018-07-04 ENCOUNTER — Encounter (HOSPITAL_BASED_OUTPATIENT_CLINIC_OR_DEPARTMENT_OTHER): Payer: Self-pay | Admitting: *Deleted

## 2018-07-04 ENCOUNTER — Emergency Department (HOSPITAL_BASED_OUTPATIENT_CLINIC_OR_DEPARTMENT_OTHER)
Admission: EM | Admit: 2018-07-04 | Discharge: 2018-07-04 | Disposition: A | Discharge status: Home / Self Care | Payer: Medicare Other | Attending: Emergency Medicine | Admitting: Emergency Medicine

## 2018-07-04 ENCOUNTER — Emergency Department (HOSPITAL_BASED_OUTPATIENT_CLINIC_OR_DEPARTMENT_OTHER): Payer: Medicare Other

## 2018-07-04 ENCOUNTER — Other Ambulatory Visit: Payer: Self-pay

## 2018-07-04 DIAGNOSIS — Z79899 Other long term (current) drug therapy: Secondary | ICD-10-CM | POA: Diagnosis not present

## 2018-07-04 DIAGNOSIS — M79605 Pain in left leg: Secondary | ICD-10-CM | POA: Diagnosis not present

## 2018-07-04 DIAGNOSIS — Z7982 Long term (current) use of aspirin: Secondary | ICD-10-CM | POA: Diagnosis not present

## 2018-07-04 DIAGNOSIS — Z87891 Personal history of nicotine dependence: Secondary | ICD-10-CM | POA: Insufficient documentation

## 2018-07-04 DIAGNOSIS — M25551 Pain in right hip: Secondary | ICD-10-CM | POA: Diagnosis not present

## 2018-07-04 MED ORDER — DICLOFENAC SODIUM 1 % TD GEL: 4.0000 g | Freq: Four times a day (QID) | TRANSDERMAL | 1 refills | Status: DC

## 2018-07-04 NOTE — Discharge Instructions (Signed)
Use the gel 4 times a day as needed for pain.  Use 2 extra strength tylenol every 4-6 hours.  Heating pad may also help.

## 2018-07-04 NOTE — ED Triage Notes (Signed)
Pt c/o shooting pain in left thigh since Wednesday. Denies falls and/or injury. Taking tylenol and ibuprofen without relief

## 2018-07-04 NOTE — ED Notes (Signed)
Patient transported to X-ray 

## 2018-07-04 NOTE — ED Provider Notes (Signed)
[facility] EMERGENCY DEPARTMENT Provider Note   CSN: [id number] Arrival date & time: 07/04/18  1326     History   Chief Complaint Chief Complaint  Patient presents with  . Leg Pain    HPI Angel Barton is a 84 y.o. male.  The history is provided by the patient.  Leg Pain   This is a new problem. Episode onset: 4-5 days ago. The problem occurs constantly. The problem has been gradually worsening. The pain is present in the left upper leg and left hip. The quality of the pain is described as aching. The pain is at a severity of 7/10. The pain is moderate. Pertinent negatives include full range of motion and no stiffness. Associated symptoms comments: No abd pain, back pain, fever, leg swelling, SOB.  No urinary issues.  Pain is mostly only present when bearing weight.. The symptoms are aggravated by standing and activity. He has tried OTC pain medications for the symptoms. The treatment provided no relief. There has been no history of extremity trauma. no recent falls or different activity.      Past Medical History:  Diagnosis Date  . ADD (attention deficit disorder)   . BPH (benign prostatic hyperplasia)   . Does use hearing aid   . Kidney stones   . Nocturia     Patient Active Problem List   Diagnosis Date Noted  . Pedal edema 02/04/2018  . S/p TAVR (transcatheter aortic valve replacement), bioprosthetic 02/04/2018  . Encounter for follow-up for aortic valve replacement 01/29/2018  . Fall 01/29/2018  . Hematuria 01/29/2018    Past Surgical History:  Procedure Laterality Date  . AORTIC VALVE REPLACEMENT  06/26/2017  . right hip replacement  1994        Home Medications    Prior to Admission medications   Medication Sig Start Date End Date Taking? Authorizing Provider  ALPRAZolam (XANAX) 0.25 MG tablet Take 0.25 mg by mouth 2 (two) times daily as needed.   Yes [provider]  aspirin EC 81 MG tablet Take 81 mg by mouth daily.   Yes [provider]  atenolol (TENORMIN) 50 MG tablet Take 50 mg by mouth daily.   Yes [provider]  Cyanocobalamin (VITAMIN B-12) 5000 MCG TBDP DISSOLVE ONE TABLET in MOUTH EVERY DAY 01/19/18  Yes [provider]  dutasteride (AVODART) 0.5 MG capsule Take 0.5 mg by mouth daily.   Yes [provider]  metFORMIN (GLUCOPHAGE) 500 MG tablet Take 1 tablet by mouth 2 (two) times daily with a meal.   Yes [provider]  methylphenidate (RITALIN) 20 MG tablet Take 10 mg by mouth 3 (three) times daily.    Yes [provider]  PARoxetine (PAXIL) 40 MG tablet Take 40 mg by mouth every morning.   Yes [provider]  simvastatin (ZOCOR) 20 MG tablet Take 20 mg by mouth daily.   Yes [provider]  terazosin (HYTRIN) 10 MG capsule Take 10 mg by mouth at bedtime.   Yes [provider]    Family History Family History  Problem Relation Age of Onset  . Prostate cancer Father     Social History Social History   Tobacco Use  . Smoking status: Former Research scientist (life sciences)  . Smokeless tobacco: Never Used  Substance Use Topics  . Alcohol use: Never    Frequency: Never  . Drug use: Never     Allergies   Patient has no known allergies.   Review of Systems  Review of Systems  Musculoskeletal: Negative for stiffness.  All other systems reviewed and are negative.    Physical Exam Updated Vital Signs BP (!) 145/65 (BP Location: Right Arm)   Pulse 65   Temp 98.8 F (37.1 C) (Oral)   Resp 18   Ht 5\' 9"  (1.753 m)   Wt 79.4 kg   SpO2 97%   BMI 25.84 kg/m   Physical Exam  Constitutional: He is oriented to person, place, and time. He appears well-developed and well-nourished. No distress.  HENT:  Head: Normocephalic and atraumatic.  Eyes: Pupils are equal, round, and reactive to light.  Cardiovascular: Normal rate and intact distal pulses.  Pulmonary/Chest: Effort normal.  Musculoskeletal: He exhibits no tenderness.       Left hip:  He exhibits normal range of motion, normal strength, no tenderness, no bony tenderness and no deformity.       Left knee: Normal.       Legs: Left foot good color without skin breakdown.  No rashes present on the leg.  Pain reproduced when pt stands to bear weight.  He then points to the mid lateral thigh as where he has pain.  Neurological: He is alert and oriented to person, place, and time.  5/5 strength in LLE.  Sensation intact.  Nursing note and vitals reviewed.    ED Treatments / Results  Labs (all labs ordered are listed, but only abnormal results are displayed) Labs Reviewed - No data to display  EKG None  Radiology Dg Hip Unilat W Or Wo Pelvis 2-3 Views Left  Result Date: 07/04/2018 CLINICAL DATA:  Left hip pain x3 days, no known injury EXAM: DG HIP (WITH OR WITHOUT PELVIS) 2-3V LEFT COMPARISON:  None. FINDINGS: No fracture or dislocation is seen. Right hip arthroplasty, in satisfactory position. Left hip joint space is preserved. Visualized bony pelvis appears intact. Degenerative changes the lower lumbar spine. IMPRESSION: No acute osseus abnormality is seen. Status post right hip arthroplasty. Electronically Signed   By: Julian Hy M.D.   On: 07/04/2018 15:11    Procedures Procedures (including critical care time)  Medications Ordered in ED Medications - No data to display   Initial Impression / Assessment and Plan / ED Course  I have reviewed the triage vital signs and the nursing notes.  Pertinent labs & imaging results that were available during my care of the patient were reviewed by me and considered in my medical decision making (see chart for details).     Elderly male presenting today with worsening left leg pain that started on Wednesday.  The worst with weight bearing.  No trauma or color change.  Good palpable pulse and sensation.  No evidence of zoster, effusion or cellulitis.  No abd or chest c/o concerning for AAA, dissection and no findings to  suggest DVT.  Suspect possible hip pathology.  Possible buristis or arthritis related or possible radiculopathy.  No neuro findings.  Pt comfortable appearing on exam and no midline back tenderness concerning for compression fx.  Hip image pending.  3:32 PM Xray with arthritis but no other acute process.  Pt is having pain with weight bearing here.  Will d/c home with tylenol and voltaren gel.  Cautioned him about using ibuprofen.  Discussed ortho f/u, face to face home health also ordered.   Final Clinical Impressions(s) / ED Diagnoses   Final diagnoses:  Left leg pain    ED Discharge Orders         Ordered  diclofenac sodium (VOLTAREN) 1 % GEL  4 times daily     07/04/18 [place]     07/04/18 1529    Face-to-face encounter (required for Medicare/Medicaid patients)    Comments:  I Blanchie Dessert certify that this patient is under my care and that I, or a nurse practitioner or physician's assistant working with me, had a face-to-face encounter that meets the physician face-to-face encounter requirements with this patient on 07/04/2018. The encounter with the patient was in whole, or in part for the following medical condition(s) which is the primary reason for home health care (List medical condition): Pt with recent development of left leg pain making it impossible for him to bear weight and walk.  Please contact hs Dr. [facility] physician for further quesitons.   07/04/18 Robin [place], MD 07/04/18 1533

## 2018-07-05 NOTE — Care Management Note (Signed)
Case Management Note  Patient Details  Name: Kyle Luppino MRN: [id number] Date of Birth: 09-13-1926  CM consulted for [facility] needs.  CM contacted pt via phone and spoke with daughter-in-law, pt and family were in the background.  Pt has been with [facility] in the past and would like to use them again.  CM contacted Tiffany with [facility] who accepted pt for services.  Updated Dr. Maryan Rued via messages.  No further CM needs noted at this time.  Expected Discharge Date:   07/04/2018               Expected Discharge Plan:  [place]  Discharge planning Services  CM Consult  Post Acute Care Choice:  Home Health Choice offered to:  Patient, Adult Children, Spouse  HH Arranged:  PT, Nurse's Aide [place] Agency:  [facility]  Status of Service:  Completed, signed off  Rae Mar, RN 07/05/2018, 9:11 AM

## 2018-07-06 DIAGNOSIS — M5136 Other intervertebral disc degeneration, lumbar region: Secondary | ICD-10-CM | POA: Diagnosis not present

## 2018-07-06 DIAGNOSIS — M25552 Pain in left hip: Secondary | ICD-10-CM | POA: Diagnosis not present

## 2018-07-06 DIAGNOSIS — F909 Attention-deficit hyperactivity disorder, unspecified type: Secondary | ICD-10-CM | POA: Diagnosis not present

## 2018-07-06 DIAGNOSIS — N401 Enlarged prostate with lower urinary tract symptoms: Secondary | ICD-10-CM | POA: Diagnosis not present

## 2018-07-06 DIAGNOSIS — M79605 Pain in left leg: Secondary | ICD-10-CM | POA: Diagnosis not present

## 2018-07-06 DIAGNOSIS — E119 Type 2 diabetes mellitus without complications: Secondary | ICD-10-CM | POA: Diagnosis not present

## 2018-07-07 DIAGNOSIS — F909 Attention-deficit hyperactivity disorder, unspecified type: Secondary | ICD-10-CM | POA: Diagnosis not present

## 2018-07-07 DIAGNOSIS — N401 Enlarged prostate with lower urinary tract symptoms: Secondary | ICD-10-CM | POA: Diagnosis not present

## 2018-07-07 DIAGNOSIS — E119 Type 2 diabetes mellitus without complications: Secondary | ICD-10-CM | POA: Diagnosis not present

## 2018-07-07 DIAGNOSIS — M25552 Pain in left hip: Secondary | ICD-10-CM | POA: Diagnosis not present

## 2018-07-07 DIAGNOSIS — M5136 Other intervertebral disc degeneration, lumbar region: Secondary | ICD-10-CM | POA: Diagnosis not present

## 2018-07-07 DIAGNOSIS — M79605 Pain in left leg: Secondary | ICD-10-CM | POA: Diagnosis not present

## 2018-07-12 ENCOUNTER — Ambulatory Visit (INDEPENDENT_AMBULATORY_CARE_PROVIDER_SITE_OTHER): Payer: Medicare Other | Admitting: Family Medicine

## 2018-07-12 ENCOUNTER — Encounter: Payer: Self-pay | Admitting: Family Medicine

## 2018-07-12 VITALS — BP 122/52 | HR 50 | Ht 68.0 in | Wt 170.0 lb

## 2018-07-12 DIAGNOSIS — M1612 Unilateral primary osteoarthritis, left hip: Secondary | ICD-10-CM

## 2018-07-12 NOTE — Patient Instructions (Signed)
Your pain is due to left hip arthritis. These are the different medications you can take for this: Tylenol 500mg  1-2 tabs three times a day for pain. Aspercreme, or biofreeze topically up to four times a day may also help with pain. Some supplements that may help for arthritis: Boswellia extract, curcumin, pycnogenol Voltaren gel up to 4 times a day. Cortisone injections are an option if you're struggling. Add physical therapy for this as well. Try to do the home exercises they show you on days he doesn't come out to do the physical therapy. It's important that you continue to stay active. Shoe inserts with good arch support may be helpful. Heat or ice 15 minutes at a time 3-4 times a day as needed to help with pain. Follow up with me in 5-6 weeks or as needed if you're doing well.

## 2018-07-12 NOTE — Progress Notes (Signed)
PCP: Delorise Shiner, MD  Subjective:   HPI: Patient is a 84 y.o. male here for left hip and leg pain for about 10 days.  Patient reports sharp pain in the left hip and thigh which is worse with weightbearing.  He denies any specific injury.  He was seen in the emergency department on 07/04/2018 for this where pelvis, left hip x-rays were obtained.  He also had home physical therapy order for him.  They have seen him one time so far.  Currently at this time his pain is 0/10 while seated.  He expresses worse pain with weightbearing or extending his hip.  As a result he is walking with a walker and sleeping in a reclining chair.  He denies any back pain or knee pain.  He does note feeling decreased range of motion in that left hip.  He denies any numbness or tingling in the lower extremity.  Denies any bruising or swelling.  Past Medical History:  Diagnosis Date  . ADD (attention deficit disorder)   . BPH (benign prostatic hyperplasia)   . Does use hearing aid   . Kidney stones   . Nocturia     Current Outpatient Medications on File Prior to Visit  Medication Sig Dispense Refill  . ALPRAZolam (XANAX) 0.25 MG tablet Take 0.25 mg by mouth 2 (two) times daily as needed.    Marland Kitchen aspirin EC 81 MG tablet Take 81 mg by mouth daily.    Marland Kitchen atenolol (TENORMIN) 50 MG tablet Take 50 mg by mouth daily.    . Cyanocobalamin (VITAMIN B-12) 5000 MCG TBDP DISSOLVE ONE TABLET in MOUTH EVERY DAY  3  . diclofenac sodium (VOLTAREN) 1 % GEL Apply 4 g topically 4 (four) times daily. 100 g 1  . dutasteride (AVODART) 0.5 MG capsule Take 0.5 mg by mouth daily.    . metFORMIN (GLUCOPHAGE) 500 MG tablet Take 1 tablet by mouth 2 (two) times daily with a meal.    . methylphenidate (RITALIN) 20 MG tablet Take 10 mg by mouth 3 (three) times daily.     Marland Kitchen PARoxetine (PAXIL) 40 MG tablet Take 40 mg by mouth every morning.    . simvastatin (ZOCOR) 20 MG tablet Take 20 mg by mouth daily.    Marland Kitchen terazosin (HYTRIN) 10 MG capsule Take 10  mg by mouth at bedtime.     No current facility-administered medications on file prior to visit.     Past Surgical History:  Procedure Laterality Date  . AORTIC VALVE REPLACEMENT  06/26/2017  . right hip replacement  1994    No Known Allergies  Social History   Socioeconomic History  . Marital status: Married    Spouse name: Not on file  . Number of children: Not on file  . Years of education: Not on file  . Highest education level: Not on file  Occupational History  . Not on file  Social Needs  . Financial resource strain: Not on file  . Food insecurity:    Worry: Not on file    Inability: Not on file  . Transportation needs:    Medical: Not on file    Non-medical: Not on file  Tobacco Use  . Smoking status: Former Research scientist (life sciences)  . Smokeless tobacco: Never Used  Substance and Sexual Activity  . Alcohol use: Never    Frequency: Never  . Drug use: Never  . Sexual activity: Not on file  Lifestyle  . Physical activity:    Days per week:  Not on file    Minutes per session: Not on file  . Stress: Not on file  Relationships  . Social connections:    Talks on phone: Not on file    Gets together: Not on file    Attends religious service: Not on file    Active member of club or organization: Not on file    Attends meetings of clubs or organizations: Not on file    Relationship status: Not on file  . Intimate partner violence:    Fear of current or ex partner: Not on file    Emotionally abused: Not on file    Physically abused: Not on file    Forced sexual activity: Not on file  Other Topics Concern  . Not on file  Social History Narrative  . Not on file    Family History  Problem Relation Age of Onset  . Prostate cancer Father     BP (!) 122/52   Pulse (!) 50   Ht 5\' 8"  (1.727 m)   Wt 170 lb (77.1 kg)   BMI 25.85 kg/m   Review of Systems: See HPI above.     Objective:  Physical Exam:  Gen: awake, alert, NAD, comfortable in exam room Pulm: breathing  unlabored  Left hip:  - Inspection: No gross deformity, no swelling, erythema, or ecchymosis - Palpation: No TTP, specifically none over greater trochanter - ROM: Significant reduction in range of motion in internal and external rotation.  Decreased hip flexion.  No significant pain with passive range of motion - Strength: Normal strength. - Neuro/vasc: NV intact distally -Negative logroll  Lumbar spine: - Palpation: No gross deformity.  No TTP over the spinous processes, paraspinal muscles.  Strength 5/5 lower extremities.  Neuro: No focal neurologic deficits   Assessment & Plan:  1.  Left hip pain-consistent with osteoarthritis given decreased motion of left hip, independently reviewed radiographs showing mild arthritis of hip with a cam deformity noted suggesting FAI as well.  Patient has no physical exam findings concerning for low back issue or radiculopathy.   He has history of right hip arthroplasty.  No evidence of fracture on x-ray - Continue home physical therapy - add specifically for this hip - Continue using a walker for ambulation - Tylenol as needed for pain - Follow-up in 4 to 6 weeks if needed.  If pain still bothersome consider intra-articular steroid injection at that time.

## 2018-07-14 DIAGNOSIS — E119 Type 2 diabetes mellitus without complications: Secondary | ICD-10-CM | POA: Diagnosis not present

## 2018-07-14 DIAGNOSIS — M5136 Other intervertebral disc degeneration, lumbar region: Secondary | ICD-10-CM | POA: Diagnosis not present

## 2018-07-14 DIAGNOSIS — M79605 Pain in left leg: Secondary | ICD-10-CM | POA: Diagnosis not present

## 2018-07-14 DIAGNOSIS — N401 Enlarged prostate with lower urinary tract symptoms: Secondary | ICD-10-CM | POA: Diagnosis not present

## 2018-07-14 DIAGNOSIS — M25552 Pain in left hip: Secondary | ICD-10-CM | POA: Diagnosis not present

## 2018-07-14 DIAGNOSIS — F909 Attention-deficit hyperactivity disorder, unspecified type: Secondary | ICD-10-CM | POA: Diagnosis not present

## 2018-07-20 DIAGNOSIS — F909 Attention-deficit hyperactivity disorder, unspecified type: Secondary | ICD-10-CM | POA: Diagnosis not present

## 2018-07-20 DIAGNOSIS — E119 Type 2 diabetes mellitus without complications: Secondary | ICD-10-CM | POA: Diagnosis not present

## 2018-07-20 DIAGNOSIS — N401 Enlarged prostate with lower urinary tract symptoms: Secondary | ICD-10-CM | POA: Diagnosis not present

## 2018-07-20 DIAGNOSIS — M25552 Pain in left hip: Secondary | ICD-10-CM | POA: Diagnosis not present

## 2018-07-20 DIAGNOSIS — M79605 Pain in left leg: Secondary | ICD-10-CM | POA: Diagnosis not present

## 2018-07-20 DIAGNOSIS — M5136 Other intervertebral disc degeneration, lumbar region: Secondary | ICD-10-CM | POA: Diagnosis not present

## 2018-07-22 DIAGNOSIS — F909 Attention-deficit hyperactivity disorder, unspecified type: Secondary | ICD-10-CM | POA: Diagnosis not present

## 2018-07-22 DIAGNOSIS — M79605 Pain in left leg: Secondary | ICD-10-CM | POA: Diagnosis not present

## 2018-07-22 DIAGNOSIS — M5136 Other intervertebral disc degeneration, lumbar region: Secondary | ICD-10-CM | POA: Diagnosis not present

## 2018-07-22 DIAGNOSIS — E119 Type 2 diabetes mellitus without complications: Secondary | ICD-10-CM | POA: Diagnosis not present

## 2018-07-22 DIAGNOSIS — M25552 Pain in left hip: Secondary | ICD-10-CM | POA: Diagnosis not present

## 2018-07-22 DIAGNOSIS — N401 Enlarged prostate with lower urinary tract symptoms: Secondary | ICD-10-CM | POA: Diagnosis not present

## 2018-07-27 DIAGNOSIS — F909 Attention-deficit hyperactivity disorder, unspecified type: Secondary | ICD-10-CM | POA: Diagnosis not present

## 2018-07-27 DIAGNOSIS — M79605 Pain in left leg: Secondary | ICD-10-CM | POA: Diagnosis not present

## 2018-07-27 DIAGNOSIS — M25552 Pain in left hip: Secondary | ICD-10-CM | POA: Diagnosis not present

## 2018-07-27 DIAGNOSIS — M5136 Other intervertebral disc degeneration, lumbar region: Secondary | ICD-10-CM | POA: Diagnosis not present

## 2018-07-27 DIAGNOSIS — E119 Type 2 diabetes mellitus without complications: Secondary | ICD-10-CM | POA: Diagnosis not present

## 2018-07-27 DIAGNOSIS — N401 Enlarged prostate with lower urinary tract symptoms: Secondary | ICD-10-CM | POA: Diagnosis not present

## 2018-07-29 DIAGNOSIS — M5136 Other intervertebral disc degeneration, lumbar region: Secondary | ICD-10-CM | POA: Diagnosis not present

## 2018-07-29 DIAGNOSIS — N401 Enlarged prostate with lower urinary tract symptoms: Secondary | ICD-10-CM | POA: Diagnosis not present

## 2018-07-29 DIAGNOSIS — F909 Attention-deficit hyperactivity disorder, unspecified type: Secondary | ICD-10-CM | POA: Diagnosis not present

## 2018-07-29 DIAGNOSIS — E119 Type 2 diabetes mellitus without complications: Secondary | ICD-10-CM | POA: Diagnosis not present

## 2018-07-29 DIAGNOSIS — M25552 Pain in left hip: Secondary | ICD-10-CM | POA: Diagnosis not present

## 2018-07-29 DIAGNOSIS — M79605 Pain in left leg: Secondary | ICD-10-CM | POA: Diagnosis not present

## 2018-08-02 DIAGNOSIS — M25552 Pain in left hip: Secondary | ICD-10-CM | POA: Diagnosis not present

## 2018-08-02 DIAGNOSIS — E119 Type 2 diabetes mellitus without complications: Secondary | ICD-10-CM | POA: Diagnosis not present

## 2018-08-02 DIAGNOSIS — M79605 Pain in left leg: Secondary | ICD-10-CM | POA: Diagnosis not present

## 2018-08-02 DIAGNOSIS — F909 Attention-deficit hyperactivity disorder, unspecified type: Secondary | ICD-10-CM | POA: Diagnosis not present

## 2018-08-02 DIAGNOSIS — N401 Enlarged prostate with lower urinary tract symptoms: Secondary | ICD-10-CM | POA: Diagnosis not present

## 2018-08-02 DIAGNOSIS — M5136 Other intervertebral disc degeneration, lumbar region: Secondary | ICD-10-CM | POA: Diagnosis not present

## 2018-08-03 DIAGNOSIS — E119 Type 2 diabetes mellitus without complications: Secondary | ICD-10-CM | POA: Diagnosis not present

## 2018-08-03 DIAGNOSIS — N401 Enlarged prostate with lower urinary tract symptoms: Secondary | ICD-10-CM | POA: Diagnosis not present

## 2018-08-03 DIAGNOSIS — M79605 Pain in left leg: Secondary | ICD-10-CM | POA: Diagnosis not present

## 2018-08-03 DIAGNOSIS — M5136 Other intervertebral disc degeneration, lumbar region: Secondary | ICD-10-CM | POA: Diagnosis not present

## 2018-08-03 DIAGNOSIS — F909 Attention-deficit hyperactivity disorder, unspecified type: Secondary | ICD-10-CM | POA: Diagnosis not present

## 2018-08-03 DIAGNOSIS — M25552 Pain in left hip: Secondary | ICD-10-CM | POA: Diagnosis not present

## 2018-08-05 DIAGNOSIS — E119 Type 2 diabetes mellitus without complications: Secondary | ICD-10-CM | POA: Diagnosis not present

## 2018-08-05 DIAGNOSIS — M79605 Pain in left leg: Secondary | ICD-10-CM | POA: Diagnosis not present

## 2018-08-05 DIAGNOSIS — M5136 Other intervertebral disc degeneration, lumbar region: Secondary | ICD-10-CM | POA: Diagnosis not present

## 2018-08-05 DIAGNOSIS — N401 Enlarged prostate with lower urinary tract symptoms: Secondary | ICD-10-CM | POA: Diagnosis not present

## 2018-08-05 DIAGNOSIS — F909 Attention-deficit hyperactivity disorder, unspecified type: Secondary | ICD-10-CM | POA: Diagnosis not present

## 2018-08-05 DIAGNOSIS — M25552 Pain in left hip: Secondary | ICD-10-CM | POA: Diagnosis not present

## 2018-08-09 DIAGNOSIS — M25552 Pain in left hip: Secondary | ICD-10-CM | POA: Diagnosis not present

## 2018-08-09 DIAGNOSIS — N401 Enlarged prostate with lower urinary tract symptoms: Secondary | ICD-10-CM | POA: Diagnosis not present

## 2018-08-09 DIAGNOSIS — F909 Attention-deficit hyperactivity disorder, unspecified type: Secondary | ICD-10-CM | POA: Diagnosis not present

## 2018-08-09 DIAGNOSIS — M79605 Pain in left leg: Secondary | ICD-10-CM | POA: Diagnosis not present

## 2018-08-09 DIAGNOSIS — M5136 Other intervertebral disc degeneration, lumbar region: Secondary | ICD-10-CM | POA: Diagnosis not present

## 2018-08-09 DIAGNOSIS — E119 Type 2 diabetes mellitus without complications: Secondary | ICD-10-CM | POA: Diagnosis not present

## 2018-08-13 ENCOUNTER — Inpatient Hospital Stay (HOSPITAL_COMMUNITY): Payer: Medicare Other

## 2018-08-13 ENCOUNTER — Encounter (HOSPITAL_BASED_OUTPATIENT_CLINIC_OR_DEPARTMENT_OTHER): Payer: Self-pay | Admitting: Emergency Medicine

## 2018-08-13 ENCOUNTER — Other Ambulatory Visit: Payer: Self-pay

## 2018-08-13 ENCOUNTER — Inpatient Hospital Stay (HOSPITAL_BASED_OUTPATIENT_CLINIC_OR_DEPARTMENT_OTHER)
Admission: EM | Admit: 2018-08-13 | Discharge: 2018-08-15 | DRG: 683 | Disposition: A | Discharge status: Home Health | Payer: Medicare Other | Attending: Internal Medicine | Admitting: Internal Medicine

## 2018-08-13 DIAGNOSIS — R319 Hematuria, unspecified: Secondary | ICD-10-CM | POA: Diagnosis not present

## 2018-08-13 DIAGNOSIS — Z87442 Personal history of urinary calculi: Secondary | ICD-10-CM | POA: Diagnosis not present

## 2018-08-13 DIAGNOSIS — Z79899 Other long term (current) drug therapy: Secondary | ICD-10-CM | POA: Diagnosis not present

## 2018-08-13 DIAGNOSIS — N179 Acute kidney failure, unspecified: Principal | ICD-10-CM | POA: Diagnosis present

## 2018-08-13 DIAGNOSIS — Z8042 Family history of malignant neoplasm of prostate: Secondary | ICD-10-CM | POA: Diagnosis not present

## 2018-08-13 DIAGNOSIS — Z7984 Long term (current) use of oral hypoglycemic drugs: Secondary | ICD-10-CM

## 2018-08-13 DIAGNOSIS — F419 Anxiety disorder, unspecified: Secondary | ICD-10-CM | POA: Diagnosis present

## 2018-08-13 DIAGNOSIS — N185 Chronic kidney disease, stage 5: Secondary | ICD-10-CM | POA: Diagnosis present

## 2018-08-13 DIAGNOSIS — N401 Enlarged prostate with lower urinary tract symptoms: Secondary | ICD-10-CM | POA: Diagnosis present

## 2018-08-13 DIAGNOSIS — E785 Hyperlipidemia, unspecified: Secondary | ICD-10-CM | POA: Diagnosis present

## 2018-08-13 DIAGNOSIS — Z96641 Presence of right artificial hip joint: Secondary | ICD-10-CM | POA: Diagnosis present

## 2018-08-13 DIAGNOSIS — E1122 Type 2 diabetes mellitus with diabetic chronic kidney disease: Secondary | ICD-10-CM | POA: Diagnosis present

## 2018-08-13 DIAGNOSIS — R5383 Other fatigue: Secondary | ICD-10-CM | POA: Diagnosis not present

## 2018-08-13 DIAGNOSIS — N136 Pyonephrosis: Secondary | ICD-10-CM | POA: Diagnosis present

## 2018-08-13 DIAGNOSIS — R269 Unspecified abnormalities of gait and mobility: Secondary | ICD-10-CM | POA: Diagnosis present

## 2018-08-13 DIAGNOSIS — L8990 Pressure ulcer of unspecified site, unspecified stage: Secondary | ICD-10-CM | POA: Diagnosis present

## 2018-08-13 DIAGNOSIS — R338 Other retention of urine: Secondary | ICD-10-CM | POA: Diagnosis present

## 2018-08-13 DIAGNOSIS — N390 Urinary tract infection, site not specified: Secondary | ICD-10-CM | POA: Diagnosis not present

## 2018-08-13 DIAGNOSIS — R339 Retention of urine, unspecified: Secondary | ICD-10-CM | POA: Diagnosis not present

## 2018-08-13 DIAGNOSIS — I120 Hypertensive chronic kidney disease with stage 5 chronic kidney disease or end stage renal disease: Secondary | ICD-10-CM | POA: Diagnosis present

## 2018-08-13 DIAGNOSIS — Z87891 Personal history of nicotine dependence: Secondary | ICD-10-CM

## 2018-08-13 DIAGNOSIS — Z7982 Long term (current) use of aspirin: Secondary | ICD-10-CM

## 2018-08-13 DIAGNOSIS — Z952 Presence of prosthetic heart valve: Secondary | ICD-10-CM | POA: Diagnosis not present

## 2018-08-13 DIAGNOSIS — F329 Major depressive disorder, single episode, unspecified: Secondary | ICD-10-CM | POA: Diagnosis present

## 2018-08-13 DIAGNOSIS — I447 Left bundle-branch block, unspecified: Secondary | ICD-10-CM

## 2018-08-13 HISTORY — DX: Left bundle-branch block, unspecified: I44.7

## 2018-08-13 LAB — GLUCOSE, CAPILLARY
Glucose-Capillary: 190 mg/dL — ABNORMAL HIGH (ref 70–99)
Glucose-Capillary: 215 mg/dL — ABNORMAL HIGH (ref 70–99)
Glucose-Capillary: 173 mg/dL — ABNORMAL HIGH (ref 70–99)

## 2018-08-13 LAB — COMPREHENSIVE METABOLIC PANEL
ALT: 12 U/L (ref 0–44)
AST: 16 U/L (ref 15–41)
Albumin: 3.2 g/dL — ABNORMAL LOW (ref 3.5–5.0)
Alkaline Phosphatase: 61 U/L (ref 38–126)
Anion gap: 14 (ref 5–15)
BUN: 116 mg/dL — ABNORMAL HIGH (ref 8–23)
CO2: 23 mmol/L (ref 22–32)
Calcium: 8.2 mg/dL — ABNORMAL LOW (ref 8.9–10.3)
Chloride: 96 mmol/L — ABNORMAL LOW (ref 98–111)
Creatinine, Ser: 10.29 mg/dL — ABNORMAL HIGH (ref 0.61–1.24)
GFR calc Af Amer: 4 mL/min — ABNORMAL LOW (ref >60)
GFR calc non Af Amer: 4 mL/min — ABNORMAL LOW (ref >60)
Glucose, Bld: 173 mg/dL — ABNORMAL HIGH (ref 70–99)
Potassium: 5.8 mmol/L — ABNORMAL HIGH (ref 3.5–5.1)
Sodium: 133 mmol/L — ABNORMAL LOW (ref 135–145)
Total Bilirubin: 0.6 mg/dL (ref 0.3–1.2)
Total Protein: 6.6 g/dL (ref 6.5–8.1)

## 2018-08-13 LAB — CBC WITH DIFFERENTIAL/PLATELET
Abs Immature Granulocytes: 0.16 10*3/uL — ABNORMAL HIGH (ref 0.00–0.07)
Basophils Absolute: 0 10*3/uL (ref 0.0–0.1)
Basophils Relative: 0 %
Eosinophils Absolute: 0 10*3/uL (ref 0.0–0.5)
Eosinophils Relative: 0 %
HCT: 32.3 % — ABNORMAL LOW (ref 39.0–52.0)
Hemoglobin: 10.3 g/dL — ABNORMAL LOW (ref 13.0–17.0)
Immature Granulocytes: 1 %
Lymphocytes Relative: 4 %
Lymphs Abs: 0.6 10*3/uL — ABNORMAL LOW (ref 0.7–4.0)
MCH: 29.6 pg (ref 26.0–34.0)
MCHC: 31.9 g/dL (ref 30.0–36.0)
MCV: 92.8 fL (ref 80.0–100.0)
Monocytes Absolute: 0.8 10*3/uL (ref 0.1–1.0)
Monocytes Relative: 6 %
Neutro Abs: 11.4 10*3/uL — ABNORMAL HIGH (ref 1.7–7.7)
Neutrophils Relative %: 89 %
Platelets: 255 10*3/uL (ref 150–400)
RBC: 3.48 MIL/uL — ABNORMAL LOW (ref 4.22–5.81)
RDW: 12.5 % (ref 11.5–15.5)
WBC: 13 10*3/uL — ABNORMAL HIGH (ref 4.0–10.5)
nRBC: 0 % (ref 0.0–0.2)

## 2018-08-13 LAB — TROPONIN I: Troponin I: <0.03 ng/mL (ref <0.03)

## 2018-08-13 LAB — URINALYSIS, MICROSCOPIC (REFLEX): WBC, UA: >50 WBC/hpf (ref 0–5)

## 2018-08-13 LAB — URINALYSIS, ROUTINE W REFLEX MICROSCOPIC
Bilirubin Urine: NEGATIVE
Glucose, UA: NEGATIVE mg/dL
Ketones, ur: NEGATIVE mg/dL
Nitrite: NEGATIVE
Protein, ur: 30 mg/dL — AB
Specific Gravity, Urine: 1.01 (ref 1.005–1.030)
pH: 6 (ref 5.0–8.0)

## 2018-08-13 LAB — BASIC METABOLIC PANEL
Anion gap: 14 (ref 5–15)
BUN: 101 mg/dL — ABNORMAL HIGH (ref 8–23)
CO2: 24 mmol/L (ref 22–32)
Calcium: 8.1 mg/dL — ABNORMAL LOW (ref 8.9–10.3)
Chloride: 99 mmol/L (ref 98–111)
Creatinine, Ser: 8.11 mg/dL — ABNORMAL HIGH (ref 0.61–1.24)
GFR calc Af Amer: 6 mL/min — ABNORMAL LOW (ref >60)
GFR calc non Af Amer: 5 mL/min — ABNORMAL LOW (ref >60)
Glucose, Bld: 134 mg/dL — ABNORMAL HIGH (ref 70–99)
Potassium: 4.8 mmol/L (ref 3.5–5.1)
Sodium: 137 mmol/L (ref 135–145)

## 2018-08-13 LAB — HEMOGLOBIN A1C
Hgb A1c MFr Bld: 6.5 % — ABNORMAL HIGH (ref 4.8–5.6)
Mean Plasma Glucose: 139.85 mg/dL

## 2018-08-13 LAB — I-STAT CG4 LACTIC ACID, ED: Lactic Acid, Venous: 1.2 mmol/L (ref 0.5–1.9)

## 2018-08-13 MED ORDER — ALPRAZOLAM 0.25 MG PO TABS: 0.2500 mg | ORAL_TABLET | Freq: Two times a day (BID) | ORAL | Status: DC | PRN

## 2018-08-13 MED ORDER — SODIUM CHLORIDE 0.9 % IV SOLN
1.0000 g | Freq: Once | INTRAVENOUS | Status: AC
  Administered 2018-08-13: 1 g via INTRAVENOUS

## 2018-08-13 MED ORDER — TERAZOSIN HCL 5 MG PO CAPS
10.0000 mg | ORAL_CAPSULE | Freq: Every day | ORAL | Status: DC
  Administered 2018-08-13 – 2018-08-14 (×2): 10 mg via ORAL
  Filled 2018-08-13 (×2): qty 2

## 2018-08-13 MED ORDER — HEPARIN SODIUM (PORCINE) 5000 UNIT/ML IJ SOLN
5000.0000 [IU] | Freq: Three times a day (TID) | INTRAMUSCULAR | Status: DC
  Administered 2018-08-13 – 2018-08-15 (×5): 5000 [IU] via SUBCUTANEOUS
  Filled 2018-08-13 (×5): qty 1

## 2018-08-13 MED ORDER — INSULIN ASPART 100 UNIT/ML ~~LOC~~ SOLN
0.0000 [IU] | Freq: Every day | SUBCUTANEOUS | Status: DC
  Administered 2018-08-13: 0 [IU] via SUBCUTANEOUS

## 2018-08-13 MED ORDER — SODIUM CHLORIDE 0.9 % IV SOLN
1.0000 g | INTRAVENOUS | Status: DC
  Administered 2018-08-13 – 2018-08-14 (×2): 1 g via INTRAVENOUS
  Filled 2018-08-13 (×3): qty 10

## 2018-08-13 MED ORDER — SIMVASTATIN 20 MG PO TABS
20.0000 mg | ORAL_TABLET | Freq: Every day | ORAL | Status: DC
  Administered 2018-08-13 – 2018-08-14 (×2): 20 mg via ORAL
  Filled 2018-08-13 (×2): qty 1

## 2018-08-13 MED ORDER — SODIUM CHLORIDE 0.9 % IV SOLN
INTRAVENOUS | Status: DC | PRN
  Administered 2018-08-13: 500 mL via INTRAVENOUS

## 2018-08-13 MED ORDER — DUTASTERIDE 0.5 MG PO CAPS
0.5000 mg | ORAL_CAPSULE | Freq: Every day | ORAL | Status: DC
  Administered 2018-08-14 – 2018-08-15 (×2): 0.5 mg via ORAL
  Filled 2018-08-13 (×2): qty 1

## 2018-08-13 MED ORDER — ASPIRIN EC 81 MG PO TBEC
81.0000 mg | DELAYED_RELEASE_TABLET | Freq: Every day | ORAL | Status: DC
  Administered 2018-08-14 – 2018-08-15 (×2): 81 mg via ORAL
  Filled 2018-08-13 (×2): qty 1

## 2018-08-13 MED ORDER — ATENOLOL 50 MG PO TABS
50.0000 mg | ORAL_TABLET | Freq: Every day | ORAL | Status: DC
  Administered 2018-08-14 – 2018-08-15 (×2): 50 mg via ORAL
  Filled 2018-08-13 (×2): qty 1

## 2018-08-13 MED ORDER — CEFTRIAXONE SODIUM 1 G IJ SOLR
INTRAMUSCULAR | Status: AC
  Filled 2018-08-13: qty 10

## 2018-08-13 MED ORDER — SODIUM CHLORIDE 0.9 % IV SOLN
INTRAVENOUS | Status: DC
  Administered 2018-08-13 – 2018-08-15 (×4): via INTRAVENOUS

## 2018-08-13 MED ORDER — PAROXETINE HCL 20 MG PO TABS
40.0000 mg | ORAL_TABLET | ORAL | Status: DC
  Administered 2018-08-14 – 2018-08-15 (×2): 40 mg via ORAL
  Filled 2018-08-13 (×2): qty 2

## 2018-08-13 MED ORDER — INSULIN ASPART 100 UNIT/ML ~~LOC~~ SOLN
0.0000 [IU] | Freq: Three times a day (TID) | SUBCUTANEOUS | Status: DC
  Administered 2018-08-14 – 2018-08-15 (×4): 1 [IU] via SUBCUTANEOUS

## 2018-08-13 NOTE — H&P (Signed)
History and Physical  Angel Barton PFX:[id number] DOB: 06/27/1926 DOA: 08/13/2018  Referring physician: Dr Maryan Rued  PCP: Delorise Shiner, MD  Outpatient Specialists: Urology Patient coming from: Home  Chief Complaint: Urinary retention  HPI: Angel Barton is a 84 y.o. male with medical history significant for aortic valve replacement TAVR, nephrolithiasis, BPH, CKD 5 who presented to [facility] ED with complaints of urinary retention.  States he has had trouble with his severely enlarged prostate.  In the last 2 days has been having trouble passing urine.  Also reports dysuria.  Denies fever, chills, flank pain, nausea or abdominal pain.  On presentation to the ED at [facility], bladder scan revealed urinary retention.  Post insertion of Foley cath yielded more than a liter of purulent urine.    ED Course: Lab studies remarkable for creatinine greater than 10, WBC of 13.0 and potassium 5.8.  UA positive for pyuria with WBC greater than 50.  TRH asked to admit.   Review of Systems: Review of systems as noted in the HPI. All other systems reviewed and are negative.   Past Medical History:  Diagnosis Date  . ADD (attention deficit disorder)   . BPH (benign prostatic hyperplasia)   . Does use hearing aid   . Kidney stones   . Nocturia    Past Surgical History:  Procedure Laterality Date  . AORTIC VALVE REPLACEMENT  06/26/2017  . JOINT REPLACEMENT    . right hip replacement  1994    Social History:  reports that he has quit smoking. He has never used smokeless tobacco. He reports that he does not drink alcohol or use drugs.   No Known Allergies  Family History  Problem Relation Age of Onset  . Prostate cancer Father       Prior to Admission medications   Medication Sig Start Date End Date Taking? Authorizing Provider  ALPRAZolam (XANAX) 0.25 MG tablet Take 0.25 mg by mouth 2 (two) times daily as needed.    [provider]  aspirin EC 81 MG  tablet Take 81 mg by mouth daily.    [provider]  atenolol (TENORMIN) 50 MG tablet Take 50 mg by mouth daily.    [provider]  Cyanocobalamin (VITAMIN B-12) 5000 MCG TBDP DISSOLVE ONE TABLET in MOUTH EVERY DAY 01/19/18   [provider]  diclofenac sodium (VOLTAREN) 1 % GEL Apply 4 g topically 4 (four) times daily. 07/04/18   Blanchie Dessert, MD  dutasteride (AVODART) 0.5 MG capsule Take 0.5 mg by mouth daily.    [provider]  metFORMIN (GLUCOPHAGE) 500 MG tablet Take 1 tablet by mouth 2 (two) times daily with a meal.    [provider]  methylphenidate (RITALIN) 20 MG tablet Take 10 mg by mouth 3 (three) times daily.     [provider]  PARoxetine (PAXIL) 40 MG tablet Take 40 mg by mouth every morning.    [provider]  simvastatin (ZOCOR) 20 MG tablet Take 20 mg by mouth daily.    [provider]  terazosin (HYTRIN) 10 MG capsule Take 10 mg by mouth at bedtime.    [provider]    Physical Exam: BP (!) 169/66   Pulse 72   Temp 97.8 F (36.6 C) (Oral)   Resp 14   Ht 5\' 8"  (1.727 m)   Wt 77.1 kg   SpO2 96%   BMI 25.85 kg/m   . General: 84 y.o. year-old male  well developed well nourished in no acute distress.  Alert and interactive. . Cardiovascular: Regular rate and rhythm with no rubs or gallops.  No thyromegaly or JVD noted.  No lower extremity edema. 2/4 pulses in all 4 extremities. Marland Kitchen Respiratory: Clear to auscultation with no wheezes or rales. Good inspiratory effort. . Abdomen: Soft nontender nondistended with normal bowel sounds x4 quadrants. . Muskuloskeletal: No cyanosis or clubbing.  2+ pitting edema noted bilaterally. . Neuro: CN II-XII intact, strength, sensation, reflexes . Skin: No ulcerative lesions noted or rashes . Psychiatry: Judgement and insight appear normal. Mood is appropriate for condition and setting          Labs on Admission:  Basic Metabolic Panel: Recent Labs   Lab 08/13/18 1218 08/13/18 1500  NA 133* 137  K 5.8* 4.8  CL 96* 99  CO2 23 24  GLUCOSE 173* 134*  BUN 116* 101*  CREATININE 10.29* 8.11*  CALCIUM 8.2* 8.1*   Liver Function Tests: Recent Labs  Lab 08/13/18 1218  AST 16  ALT 12  ALKPHOS 61  BILITOT 0.6  PROT 6.6  ALBUMIN 3.2*   No results for input(s): LIPASE, AMYLASE in the last 168 hours. No results for input(s): AMMONIA in the last 168 hours. CBC: Recent Labs  Lab 08/13/18 1218  WBC 13.0*  NEUTROABS 11.4*  HGB 10.3*  HCT 32.3*  MCV 92.8  PLT 255   Cardiac Enzymes: Recent Labs  Lab 08/13/18 1218  TROPONINI <0.03    BNP (last 3 results) No results for input(s): BNP in the last 8760 hours.  ProBNP (last 3 results) No results for input(s): PROBNP in the last 8760 hours.  CBG: No results for input(s): GLUCAP in the last 168 hours.  Radiological Exams on Admission: No results found.  EKG: I independently viewed the EKG done and my findings are as followed: Sinus rhythm with rate of 63 with no specific ST-T changes.  Assessment/Plan Present on Admission: . ARF (acute renal failure) (HCC)  Active Problems:   ARF (acute renal failure) (HCC)  Acute kidney injury with no prior history to compare Suspect combination of prerenal and postobstructive secondary to dehydration and urinary retention Creatinine on presentation to the ED today at [facility] creatinine was 10.29 Repeated creatinine post IV fluid hydration was 8.11 Post Foley cath insertion in the ED Monitor urine output Obtain renal ultrasound Start renal diet  Complicated UTI Urine culture pending Now has Foley catheter in place due to urinary retention Start IV Rocephin empirically Monitor urine output Obtain renal ultrasound  Severe BPH complicated by urinary retention Foley catheter inserted Will need to follow-up with urology outpatient Resume home BPH medications dutasteride and terazosin  Chronic  depression/anxiety Resume Paxil  Type 2 diabetes Hold metformin Obtain A1c and start insulin sliding scale Avoid hypoglycemia  Hypertension Blood pressure is stable Resume atenolol  Hyperlipidemia Resume Zocor  Ambulatory dysfunction At baseline uses a walker Fall precautions PT to assess Out of bed to chair as tolerated  Post aortic valve replacement No acute issues    DVT prophylaxis: Subcu heparin 3 times daily  Code Status: Full code  Family Communication: Daughter-in-law and wife at bedside.  All questions answered to his satisfaction.  Disposition Plan: Admit to telemetry unit  Consults called: None.  Please consult nephrology in the morning.  Admission status: Inpatient    Kayleen Memos MD [facility] Pager [phone]  If 7PM-7AM, please contact night-coverage [email or web address] Password [email or web address]  08/13/2018, 5:17 PM

## 2018-08-13 NOTE — ED Provider Notes (Addendum)
[facility] EMERGENCY DEPARTMENT Provider Note   CSN: [id number] Arrival date & time: 08/13/18  1156     History   Chief Complaint Chief Complaint  Patient presents with  . Fatigue    HPI Angel Barton is a 84 y.o. male.  Patient is a 84 year old male with a history of aortic valve replacement, kidney stones, BPH who is presenting today with his family member for 3 days of worsening fatigue, sleeping all the time, mild confusion especially when he wakes up and decreased appetite.  Patient denies any abdominal pain, nausea, vomiting, chest pain or shortness of breath.  He has not had cough or chills.  Family denies any recent medication changes 2 weeks prior to patient's symptoms starting.  He has been having normal stools but states his urine seems a little abnormal for the last 3 or 4 days.  He also states he does not feel like he is completely emptying his bladder.  No known sick contacts.  Patient was seen at the [facility] yesterday for his psychiatry appointment but no lab work was done.  The history is provided by the patient and a caregiver.    Past Medical History:  Diagnosis Date  . ADD (attention deficit disorder)   . BPH (benign prostatic hyperplasia)   . Does use hearing aid   . Kidney stones   . Nocturia     Patient Active Problem List   Diagnosis Date Noted  . Pedal edema 02/04/2018  . S/p TAVR (transcatheter aortic valve replacement), bioprosthetic 02/04/2018  . Encounter for follow-up for aortic valve replacement 01/29/2018  . Fall 01/29/2018  . Hematuria 01/29/2018    Past Surgical History:  Procedure Laterality Date  . AORTIC VALVE REPLACEMENT  06/26/2017  . right hip replacement  1994        Home Medications    Prior to Admission medications   Medication Sig Start Date End Date Taking? Authorizing Provider  ALPRAZolam (XANAX) 0.25 MG tablet Take 0.25 mg by mouth 2 (two) times daily as needed.    [provider]  aspirin EC 81 MG  tablet Take 81 mg by mouth daily.    [provider]  atenolol (TENORMIN) 50 MG tablet Take 50 mg by mouth daily.    [provider]  Cyanocobalamin (VITAMIN B-12) 5000 MCG TBDP DISSOLVE ONE TABLET in MOUTH EVERY DAY 01/19/18   [provider]  diclofenac sodium (VOLTAREN) 1 % GEL Apply 4 g topically 4 (four) times daily. 07/04/18   Blanchie Dessert, MD  dutasteride (AVODART) 0.5 MG capsule Take 0.5 mg by mouth daily.    [provider]  metFORMIN (GLUCOPHAGE) 500 MG tablet Take 1 tablet by mouth 2 (two) times daily with a meal.    [provider]  methylphenidate (RITALIN) 20 MG tablet Take 10 mg by mouth 3 (three) times daily.     [provider]  PARoxetine (PAXIL) 40 MG tablet Take 40 mg by mouth every morning.    [provider]  simvastatin (ZOCOR) 20 MG tablet Take 20 mg by mouth daily.    [provider]  terazosin (HYTRIN) 10 MG capsule Take 10 mg by mouth at bedtime.    [provider]    Family History Family History  Problem Relation Age of Onset  . Prostate cancer Father     Social History Social History   Tobacco Use  . Smoking status: Former Research scientist (life sciences)  . Smokeless tobacco: Never Used  Substance Use Topics  .  Alcohol use: Never    Frequency: Never  . Drug use: Never     Allergies   Patient has no known allergies.   Review of Systems Review of Systems  Cardiovascular: Positive for leg swelling.       Chronic swelling in bilateral lower extremities that is unchanged  All other systems reviewed and are negative.    Physical Exam Updated Vital Signs BP (!) 150/58 (BP Location: Right Arm)   Pulse (!) 58   Temp 97.8 F (36.6 C) (Oral)   Resp 16   Ht 5\' 8"  (1.727 m)   Wt 77.1 kg   SpO2 100%   BMI 25.85 kg/m   Physical Exam  Constitutional: He is oriented to person, place, and time. He appears well-developed and well-nourished. No distress.  HENT:  Head: Normocephalic and  atraumatic.  Mouth/Throat: Oropharynx is clear and moist.  Dry mucous membranes  Eyes: Pupils are equal, round, and reactive to light. Conjunctivae and EOM are normal.  Neck: Normal range of motion. Neck supple.  Cardiovascular: Normal rate, regular rhythm and intact distal pulses.  No murmur heard. Pulmonary/Chest: Effort normal and breath sounds normal. No respiratory distress. He has no wheezes. He has no rales.  Abdominal: Soft. He exhibits no distension. There is no tenderness. There is no rebound and no guarding.  Firm palpable mass from the pubic symphysis up to the umbilicus.  Minimal tenderness with palpation  Musculoskeletal: Normal range of motion. He exhibits edema. He exhibits no tenderness.  2+ pitting edema in bilateral lower extremities up to the midshin  Neurological: He is alert and oriented to person, place, and time.  Skin: Skin is warm and dry. Capillary refill takes 2 to 3 seconds. No rash noted. No erythema.  Psychiatric: He has a normal mood and affect. His behavior is normal.  Nursing note and vitals reviewed.    ED Treatments / Results  Labs (all labs ordered are listed, but only abnormal results are displayed) Labs Reviewed  CBC WITH DIFFERENTIAL/PLATELET - Abnormal; Notable for the following components:      Result Value   WBC 13.0 (*)    RBC 3.48 (*)    Hemoglobin 10.3 (*)    HCT 32.3 (*)    Neutro Abs 11.4 (*)    Lymphs Abs 0.6 (*)    Abs Immature Granulocytes 0.16 (*)    All other components within normal limits  COMPREHENSIVE METABOLIC PANEL - Abnormal; Notable for the following components:   Sodium 133 (*)    Potassium 5.8 (*)    Chloride 96 (*)    Glucose, Bld 173 (*)    BUN 116 (*)    Creatinine, Ser 10.29 (*)    Calcium 8.2 (*)    Albumin 3.2 (*)    GFR calc non Af Amer 4 (*)    GFR calc Af Amer 4 (*)    All other components within normal limits  URINALYSIS, ROUTINE W REFLEX MICROSCOPIC - Abnormal; Notable for the following components:     APPearance CLOUDY (*)    Hgb urine dipstick LARGE (*)    Protein, ur 30 (*)    Leukocytes, UA LARGE (*)    All other components within normal limits  URINALYSIS, MICROSCOPIC (REFLEX) - Abnormal; Notable for the following components:   Bacteria, UA FEW (*)    All other components within normal limits  TROPONIN I  I-STAT CG4 LACTIC ACID, ED    EKG EKG Interpretation  Date/Time:  Friday August 13 2018 12:29:03 EDT Ventricular Rate:  61 PR Interval:    QRS Duration: 150 QT Interval:  479 QTC Calculation: 483 R Axis:   -14 Text Interpretation:  Sinus rhythm Atrial premature complex Short PR interval Left bundle branch block Baseline wander in lead(s) V3 No significant change since last tracing 02/04/18 Reconfirmed by Blanchie Dessert [phone]) on 08/13/2018 12:46:45 PM   Radiology No results found.  Procedures Procedures (including critical care time)  Medications Ordered in ED Medications - No data to display   Initial Impression / Assessment and Plan / ED Course  I have reviewed the triage vital signs and the nursing notes.  Pertinent labs & imaging results that were available during my care of the patient were reviewed by me and considered in my medical decision making (see chart for details).    Patient is a 84 year old male presenting today with general fatigue, mild confusion when waking up and decreased appetite for the last 3 days.  No other complaints except for some nonspecific vague urinary complaints for the last 3 to 4 days.  On exam patient is in no acute distress with reassuring vital signs.  He does have a palpable mass that goes to his umbilicus on exam concerning for possible bladder distention from incomplete emptying.  Also concern for possible infection versus electrolyte abnormality versus renal issue causing his symptoms.  CBC, CMP, lactate, UA, troponin, EKG pending.  Bladder scan pending.  1:50 PM Patient had urinary retention and had greater than  1500 mL's of urine out after Foley was placed.  Also real at urine with evidence of infection on UA today.  Urine was cultured and patient was started on Rocephin.  Patient has a leukocytosis of 13,000 hemoglobin of 10, normal lactic acid and CMP with evidence of acute renal failure with a creatinine of 10.  Mild upper kalemia of 5.8 and elevated BUN.  Feel that the acute renal failure will most likely resolve now that the bladder is draining.  Will admit for further care.  Patient's vital signs remained stable.  2:08 PM Spoke with nephrology who recommended just repeating a chemistry to ensure numbers improve and that they would not need to consult unless they do not.  CRITICAL CARE Performed by: Wilma Wuthrich Total critical care time: 30 minutes Critical care time was exclusive of separately billable procedures and treating other patients. Critical care was necessary to treat or prevent imminent or life-threatening deterioration. Critical care was time spent personally by me on the following activities: development of treatment plan with patient and/or surrogate as well as nursing, discussions with consultants, evaluation of patient's response to treatment, examination of patient, obtaining history from patient or surrogate, ordering and performing treatments and interventions, ordering and review of laboratory studies, ordering and review of radiographic studies, pulse oximetry and re-evaluation of patient's condition.   Final Clinical Impressions(s) / ED Diagnoses   Final diagnoses:  Urinary tract infection with hematuria, site unspecified  Urinary retention  Acute renal failure, unspecified acute renal failure type [facility])    ED Discharge Orders    None       Blanchie Dessert, MD 08/13/18 1352    Blanchie Dessert, MD 08/13/18 1409    Blanchie Dessert, MD 08/13/18 1540

## 2018-08-13 NOTE — ED Notes (Signed)
Transferred to [facility] via [facility].  Patient's stepson, Gwyndolyn Saxon, is with patient during this transfer.

## 2018-08-13 NOTE — ED Notes (Signed)
Urine draining from the f/c started to be cloudy yellow and continued to drain with very cloudy (pus-like) urine output and now its cloudy reddish urine.

## 2018-08-13 NOTE — ED Notes (Signed)
ED Provider at bedside. 

## 2018-08-13 NOTE — ED Notes (Signed)
A big lump noted midway of patient's umbilicus area.  Bladder scan done by EMT >999.  Patient denies pain.

## 2018-08-13 NOTE — Progress Notes (Signed)
Explained Renal Fx to son, wife and patient.

## 2018-08-13 NOTE — ED Triage Notes (Addendum)
Reports fatigue since Wednesday.  Per stepson patient has been sleeping more than normal and is very disoriented upon waking up.  Reports decreased appetite as well.

## 2018-08-14 DIAGNOSIS — L8990 Pressure ulcer of unspecified site, unspecified stage: Secondary | ICD-10-CM

## 2018-08-14 DIAGNOSIS — N200 Calculus of kidney: Secondary | ICD-10-CM

## 2018-08-14 DIAGNOSIS — N185 Chronic kidney disease, stage 5: Secondary | ICD-10-CM

## 2018-08-14 DIAGNOSIS — R339 Retention of urine, unspecified: Secondary | ICD-10-CM

## 2018-08-14 HISTORY — DX: Chronic kidney disease, stage 5: N18.5

## 2018-08-14 HISTORY — DX: Retention of urine, unspecified: R33.9

## 2018-08-14 HISTORY — DX: Calculus of kidney: N20.0

## 2018-08-14 LAB — CBC
HCT: 28.1 % — ABNORMAL LOW (ref 39.0–52.0)
Hemoglobin: 8.9 g/dL — ABNORMAL LOW (ref 13.0–17.0)
MCH: 29 pg (ref 26.0–34.0)
MCHC: 31.7 g/dL (ref 30.0–36.0)
MCV: 91.5 fL (ref 80.0–100.0)
Platelets: 231 10*3/uL (ref 150–400)
RBC: 3.07 MIL/uL — ABNORMAL LOW (ref 4.22–5.81)
RDW: 12.3 % (ref 11.5–15.5)
WBC: 8.3 10*3/uL (ref 4.0–10.5)
nRBC: 0 % (ref 0.0–0.2)

## 2018-08-14 LAB — BASIC METABOLIC PANEL WITH GFR
Anion gap: 5 (ref 5–15)
BUN: 45 mg/dL — ABNORMAL HIGH (ref 8–23)
CO2: 24 mmol/L (ref 22–32)
Calcium: 7.8 mg/dL — ABNORMAL LOW (ref 8.9–10.3)
Chloride: 112 mmol/L — ABNORMAL HIGH (ref 98–111)
Creatinine, Ser: 2.51 mg/dL — ABNORMAL HIGH (ref 0.61–1.24)
GFR calc Af Amer: 24 mL/min — ABNORMAL LOW (ref >60)
GFR calc non Af Amer: 21 mL/min — ABNORMAL LOW (ref >60)
Glucose, Bld: 163 mg/dL — ABNORMAL HIGH (ref 70–99)
Potassium: 3.8 mmol/L (ref 3.5–5.1)
Sodium: 141 mmol/L (ref 135–145)

## 2018-08-14 LAB — GLUCOSE, CAPILLARY
Glucose-Capillary: 150 mg/dL — ABNORMAL HIGH (ref 70–99)
Glucose-Capillary: 140 mg/dL — ABNORMAL HIGH (ref 70–99)
Glucose-Capillary: 149 mg/dL — ABNORMAL HIGH (ref 70–99)

## 2018-08-14 MED ORDER — INFLUENZA VAC SPLIT HIGH-DOSE 0.5 ML IM SUSY: 0.5000 mL | PREFILLED_SYRINGE | INTRAMUSCULAR | Status: DC | PRN

## 2018-08-14 NOTE — Progress Notes (Signed)
PROGRESS NOTE  Angel Barton WUX:[id number] DOB: March 15, 1926 DOA: 08/13/2018 PCP: Delorise Shiner, MD  HPI/Recap of past 24 hours:  Angel Barton is a 84 y.o. male with medical history significant for aortic valve replacement TAVR, nephrolithiasis, BPH, CKD 5 who presented to [facility] ED with complaints of urinary retention.  States he has had trouble with his severely enlarged prostate.  In the last 2 days has been having trouble passing urine.  Also reports dysuria.  Denies fever, chills, flank pain, nausea or abdominal pain.  On presentation to the ED at [facility], bladder scan revealed urinary retention.  Post insertion of Foley cath yielded more than a liter of purulent urine.    08/14/2018: Patient seen and examined at his bedside.  No acute events overnight.  He has no new complaints.  Denies nausea or abdominal or flank pain.  Denies any cardiopulmonary symptoms.  Assessment/Plan: Active Problems:   ARF (acute renal failure) (HCC)   Pressure injury of skin  AKI with no prior history to compare Suspect combination of prerenal and post renal Renal function significantly improved post Foley cath insertion and gentle IV fluid hydration Creatinine 2.51 today from 10.29 on presentation on 08/13/2018 just 1 day ago Continue to avoid nephrotoxic agents Keep Foley catheter in Will need to follow-up with his urologist outpatient Renal ultrasound done on 08/13/2018 revealed mild right hydronephrosis and enlarged prostate gland  Complicated UTI Urine culture pending Blood cultures in process x2 Continue IV Rocephin empirically Afebrile with no leukocytosis Continue to monitor urine output  Severe BPH with urinary retention post Foley cath insertion Follow-up with urology outpatient Continue Terazosin and dutasteride  Chronic depression/anxiety Continue Paxil and Xanax as needed  Hyperlipidemia Continue Zocor  Hypertension Blood pressures well  controlled Continue atenolol   Code Status: Full code  Family Communication: None at bedside  Disposition Plan: Home possibly tomorrow 08/15/2018 if creatinine continues to improve and continues to have good urine output.   Consultants:  None  Procedures:  Renal ultrasound  Antimicrobials:  Rocephin  DVT prophylaxis: Subcu heparin 3 times daily   Objective: Vitals:   08/13/18 2254 08/14/18 0044 08/14/18 0500 08/14/18 1020  BP: (!) 132/58 (!) 116/38 138/60 (!) 133/55  Pulse: 65 64 66 65  Resp:  18 18 20   Temp:  98.9 F (37.2 C) 98.9 F (37.2 C) (!) 97.4 F (36.3 C)  TempSrc:  Oral Oral Oral  SpO2: 96% 96% 98% 94%  Weight:   71.8 kg   Height:        Intake/Output Summary (Last 24 hours) at 08/14/2018 1315 Last data filed at 08/14/2018 0934 Gross per 24 hour  Intake 776.04 ml  Output 4000 ml  Net -3223.96 ml   Filed Weights   08/13/18 1201 08/13/18 1900 08/14/18 0500  Weight: 77.1 kg 71.1 kg 71.8 kg    Exam:  . General: 84 y.o. year-old male well developed well nourished in no acute distress.  Alert and interactive.  Very hard of hearing . Cardiovascular: Regular rate and rhythm with no rubs or gallops.  No thyromegaly or JVD noted.   Marland Kitchen Respiratory: Clear to auscultation with no wheezes or rales. Good inspiratory effort. . Abdomen: Soft nontender nondistended with normal bowel sounds x4 quadrants. . Musculoskeletal: No lower extremity edema. 2/4 pulses in all 4 extremities. . Skin: No ulcerative lesions noted or rashes, . Psychiatry: Mood is appropriate for condition and setting   Data Reviewed: CBC: Recent Labs  Lab  08/13/18 1218 08/14/18 0544  WBC 13.0* 8.3  NEUTROABS 11.4*  --   HGB 10.3* 8.9*  HCT 32.3* 28.1*  MCV 92.8 91.5  PLT 255 416   Basic Metabolic Panel: Recent Labs  Lab 08/13/18 1218 08/13/18 1500 08/14/18 0544  NA 133* 137 141  K 5.8* 4.8 3.8  CL 96* 99 112*  CO2 23 24 24   GLUCOSE 173* 134* 163*  BUN 116* 101* 45*   CREATININE 10.29* 8.11* 2.51*  CALCIUM 8.2* 8.1* 7.8*   GFR: Estimated Creatinine Clearance: 18.3 mL/min (A) (by C-G formula based on SCr of 2.51 mg/dL (H)). Liver Function Tests: Recent Labs  Lab 08/13/18 1218  AST 16  ALT 12  ALKPHOS 61  BILITOT 0.6  PROT 6.6  ALBUMIN 3.2*   No results for input(s): LIPASE, AMYLASE in the last 168 hours. No results for input(s): AMMONIA in the last 168 hours. Coagulation Profile: No results for input(s): INR, PROTIME in the last 168 hours. Cardiac Enzymes: Recent Labs  Lab 08/13/18 1218  TROPONINI <0.03   BNP (last 3 results) No results for input(s): PROBNP in the last 8760 hours. HbA1C: Recent Labs    08/13/18 1749  HGBA1C 6.5*   CBG: Recent Labs  Lab 08/13/18 1819 08/13/18 2123 08/13/18 2254 08/14/18 0732 08/14/18 1135  GLUCAP 190* 215* 173* 150* 140*   Lipid Profile: No results for input(s): CHOL, HDL, LDLCALC, TRIG, CHOLHDL, LDLDIRECT in the last 72 hours. Thyroid Function Tests: No results for input(s): TSH, T4TOTAL, FREET4, T3FREE, THYROIDAB in the last 72 hours. Anemia Panel: No results for input(s): VITAMINB12, FOLATE, FERRITIN, TIBC, IRON, RETICCTPCT in the last 72 hours. Urine analysis:    Component Value Date/Time   COLORURINE YELLOW 08/13/2018 1242   APPEARANCEUR CLOUDY (A) 08/13/2018 1242   LABSPEC 1.010 08/13/2018 1242   PHURINE 6.0 08/13/2018 1242   GLUCOSEU NEGATIVE 08/13/2018 1242   HGBUR LARGE (A) 08/13/2018 1242   BILIRUBINUR NEGATIVE 08/13/2018 1242   KETONESUR NEGATIVE 08/13/2018 1242   PROTEINUR 30 (A) 08/13/2018 1242   NITRITE NEGATIVE 08/13/2018 1242   LEUKOCYTESUR LARGE (A) 08/13/2018 1242   Sepsis Labs: @LABRCNTIP (procalcitonin:4,lacticidven:4)  )No results found for this or any previous visit (from the past 240 hour(s)).    Studies: US Renal  Result Date: 08/13/2018 CLINICAL DATA:  Acute renal insufficiency. EXAM: RENAL / URINARY TRACT ULTRASOUND COMPLETE COMPARISON:  None.  FINDINGS: Right Kidney: Renal measurements: 10.9 x 5.4 x 5.3 cm = volume: 161 mL . Echogenicity within normal limits. No mass visualized. Mild pelviectasis. Left Kidney: Renal measurements: 11.4 x 6.4 x 7.1 cm = volume: 271 mL. Echogenicity within normal limits. No mass or hydronephrosis visualized. Bladder: Bladder is decompressed around urinary Foley. The prostate gland is enlarged measuring 7.1 x 4.7 x 5.9 cm. IMPRESSION: Mild right renal pelviectasis. Normal appearance of the left kidney. Decompressed urinary bladder, which is not well characterized. Enlarged prostate gland.  Please correlate clinically. Electronically Signed   By: Fidela Salisbury M.D.   On: 08/13/2018 20:43    Scheduled Meds: . aspirin EC  81 mg Oral Daily  . atenolol  50 mg Oral Daily  . dutasteride  0.5 mg Oral Daily  . heparin injection (subcutaneous)  5,000 Units Subcutaneous Q8H  . insulin aspart  0-5 Units Subcutaneous QHS  . insulin aspart  0-9 Units Subcutaneous TID WC  . PARoxetine  40 mg Oral BH-q7a  . simvastatin  20 mg Oral q1800  . terazosin  10 mg Oral QHS  Continuous Infusions: . sodium chloride Stopped (08/13/18 1456)  . sodium chloride 75 mL/hr at 08/14/18 0651  . cefTRIAXone (ROCEPHIN)  IV 1 g (08/13/18 1833)     LOS: 1 day     Kayleen Memos, MD [facility] Pager [phone]  If 7PM-7AM, please contact night-coverage [email or web address] Password [email or web address] 08/14/2018, 1:15 PM

## 2018-08-14 NOTE — Plan of Care (Signed)
  Problem: Elimination: Goal: Will not experience complications related to urinary retention Outcome: Progressing   Problem: Clinical Measurements: Goal: Ability to maintain clinical measurements within normal limits will improve Outcome: Progressing   Problem: Health Behavior/Discharge Planning: Goal: Ability to manage health-related needs will improve Outcome: Progressing

## 2018-08-14 NOTE — Evaluation (Signed)
Physical Therapy Evaluation Patient Details Name: Angel Barton MRN: [id number] DOB: 01-Nov-1925 Today's Date: 08/14/2018   History of Present Illness  Angel Barton is a 84 y.o. male with medical history significant for aortic valve replacement TAVR, nephrolithiasis, BPH, CKD 5 who presented to [facility] ED with complaints of urinary retention    Clinical Impression  Pt admitted with above diagnosis. Pt currently with functional limitations due to the deficits listed below (see PT Problem List). PTA, pt mod I with mobility reports 2-3 falls over last year. Pt lives with wife who is incapable of properly guarding him at this point, and has been receiving HHPT recently. Children live nearby. Upon eval pt weak and deconditioned from 5+days of bed rest. Initially  requiring mod A to stand but towards end of session able to do so with close supervision. Walked short distance but needs more mobilization before safely returning home. Family hoping to avoid SNF at all costs, and I feel if [facility] services can be ramped up to provide more pockets of CRNA supervision in between therapy sessions during day time, that being home and working with [facility] PT/OT would be more beneficial than SNF.  Pt will benefit from skilled PT to increase their independence and safety with mobility to allow discharge to the venue listed below.       Follow Up Recommendations Home health PT;Other (comment);Supervision for mobility/OOB(Maximize HH services. )    Equipment Recommendations  None recommended by PT    Recommendations for Other Services OT consult     Precautions / Restrictions Precautions Precautions: Fall Restrictions Weight Bearing Restrictions: No      Mobility  Bed Mobility Overal bed mobility: Modified Independent                Transfers Overall transfer level: Needs assistance Equipment used: Rolling walker (2 wheeled) Transfers: Sit to/from Stand Sit to Stand: Min assist          General transfer comment: Min A x2, supervision x1  Ambulation/Gait Ambulation/Gait assistance: Min guard Gait Distance (Feet): 25 Feet Assistive device: Rolling walker (2 wheeled) Gait Pattern/deviations: Step-to pattern Gait velocity: decreased   General Gait Details: pt ambulating short distances slow with poor stability. RW and min guard for safety  Stairs            Wheelchair Mobility    Modified Rankin (Stroke Patients Only)       Balance Overall balance assessment: Mild deficits observed, not formally tested                                           Pertinent Vitals/Pain Pain Assessment: No/denies pain    Home Living Family/patient expects to be discharged to:: Private residence Living Arrangements: Spouse/significant other Available Help at Discharge: Family;Available 24 hours/day(wife ) Type of Home: House Home Access: Level entry     Home Layout: One level Home Equipment: Walker - 2 wheels;Walker - 4 wheels;Walker - standard;Shower seat - built in;Tub bench      Prior Function Level of Independence: Independent with assistive device(s)         Comments: limited community ambulation with RW     Hand Dominance        Extremity/Trunk Assessment   Upper Extremity Assessment Upper Extremity Assessment: Overall WFL for tasks assessed    Lower Extremity Assessment Lower Extremity Assessment: Overall WFL for  tasks assessed    Cervical / Trunk Assessment Cervical / Trunk Assessment: Kyphotic  Communication   Communication: HOH  Cognition Arousal/Alertness: Awake/alert Behavior During Therapy: WFL for tasks assessed/performed Overall Cognitive Status: Within Functional Limits for tasks assessed                                        General Comments General comments (skin integrity, edema, etc.): extensive discussion with family over safety considerations for home    Exercises     Assessment/Plan     PT Assessment Patient needs continued PT services  PT Problem List Decreased strength;Decreased range of motion       PT Treatment Interventions DME instruction;Gait training;Functional mobility training    PT Goals (Current goals can be found in the Care Plan section)  Acute Rehab PT Goals Patient Stated Goal: go home  PT Goal Formulation: With patient/family Potential to Achieve Goals: Good    Frequency Min 3X/week   Barriers to discharge Decreased caregiver support wife not able to properally guard.     Co-evaluation               AM-PAC PT "6 Clicks" Daily Activity  Outcome Measure Difficulty turning over in bed (including adjusting bedclothes, sheets and blankets)?: A Little Difficulty moving from lying on back to sitting on the side of the bed? : A Little Difficulty sitting down on and standing up from a chair with arms (e.g., wheelchair, bedside commode, etc,.)?: A Little Help needed moving to and from a bed to chair (including a wheelchair)?: A Little Help needed walking in hospital room?: A Little Help needed climbing 3-5 steps with a railing? : A Lot 6 Click Score: 17    End of Session Equipment Utilized During Treatment: Gait belt Activity Tolerance: Patient tolerated treatment well Patient left: in bed;with bed alarm set;with family/visitor present Nurse Communication: Mobility status PT Visit Diagnosis: Unsteadiness on feet (R26.81)    Time: 0045-9977 PT Time Calculation (min) (ACUTE ONLY): 36 min   Charges:   PT Evaluation $PT Eval Low Complexity: 1 Low PT Treatments $Gait Training: 8-22 mins        Reinaldo Berber, PT, DPT Acute Rehabilitation Services Pager: [phone] Office: [phone]    Reinaldo Berber 08/14/2018, 7:27 PM

## 2018-08-15 LAB — URINE CULTURE
Culture: NO GROWTH
Special Requests: NORMAL

## 2018-08-15 LAB — CREATININE, SERUM
Creatinine, Ser: 1 mg/dL (ref 0.61–1.24)
GFR calc Af Amer: >60 mL/min (ref >60)
GFR calc non Af Amer: >60 mL/min (ref >60)

## 2018-08-15 LAB — GLUCOSE, CAPILLARY
Glucose-Capillary: 150 mg/dL — ABNORMAL HIGH (ref 70–99)
Glucose-Capillary: 157 mg/dL — ABNORMAL HIGH (ref 70–99)

## 2018-08-15 MED ORDER — CEPHALEXIN 500 MG PO CAPS: 500.0000 mg | ORAL_CAPSULE | Freq: Three times a day (TID) | ORAL | 0 refills | Status: AC

## 2018-08-15 MED ORDER — CEPHALEXIN 250 MG PO CAPS
500.0000 mg | ORAL_CAPSULE | Freq: Three times a day (TID) | ORAL | Status: DC
  Administered 2018-08-15: 500 mg via ORAL
  Filled 2018-08-15: qty 2

## 2018-08-15 NOTE — Discharge Summary (Addendum)
Discharge Summary  Angel Barton [id number]:[id number] DOB: 1926-01-10  PCP: Delorise Shiner, MD  Admit date: 08/13/2018 Discharge date: 08/15/2018  Time spent: 35 minutes  Recommendations for Outpatient Follow-up:  1. Follow-up with [facility] Dr Junious Silk. Call on Monday 08/16/18 to schedule a follow up appointment. 2. Follow-up with PCP 3. Take your medications as prescribed 4. Continue physical therapy 5. Fall precautions  Discharge Diagnoses:  Active Hospital Problems   Diagnosis Date Noted  . Pressure injury of skin 08/14/2018  . ARF (acute renal failure) ([place]) 08/13/2018    Resolved Hospital Problems  No resolved problems to display.    Discharge Condition: Stable  Diet recommendation: Resume previous diet  Vitals:   08/14/18 1943 08/15/18 0542  BP: (!) 149/49 (!) 161/58  Pulse: 62 67  Resp: 18 18  Temp: 97.7 F (36.5 C) 98.3 F (36.8 C)  SpO2: 95% 96%    History of present illness:  Angel Barton a 84 y.o.malewith medical history significant foraortic valve replacementTAVR, nephrolithiasis, BPH, CKD 5 who presented to [facility] ED with complaints of urinary retention. States he hashad trouble withhisseverely enlarged prostate. In the last 2 days has been having trouble passing urine. Also reports dysuria. Denies fever,chills,flank pain, nausea or abdominal pain. On presentation to the ED at [place] scan revealed urinary retention. Post insertion of Foley cath yielded more than a liter of purulent urine.  08/14/2018: Patient seen and examined at his bedside.  No acute events overnight.  He has no new complaints.  Denies nausea or abdominal or flank pain.  Denies any cardiopulmonary symptoms.  Renal function improving drastically post Foley catheter insertion on 08/13/2018.  08/15/2018: Patient seen and examined at his bedside.  No acute events overnight.  His renal function has normalized.  No new complaints.  On  the day of discharge, the patient was hemodynamically stable.  He will need to follow-up with urology and his PCP posthospitalization.  Hospital Course:  Active Problems:   ARF (acute renal failure) (HCC)   Pressure injury of skin  Resolved AKI with no prior history to compare Suspect combination of prerenal and post renal Renal function significantly improved post Foley cath insertion and gentle IV fluid hydration starting on 08/13/2018. Creatinine 1.0 today 08/15/2018 from 2.51 from 10.29 on presentation on 08/13/2018  Continue to avoid nephrotoxic agents Keep Foley catheter in until you see an urologist Renal ultrasound done on 08/13/2018 revealed mild right hydronephrosis and enlarged prostate gland  Complicated UTI Urine culture pending; responded well to IV ceftriaxone Completed 3 days of IV/Rocephin Start Keflex p.o. 500 mg 3 times daily x7 days Blood cultures in process x2 Afebrile with no leukocytosis  Severe BPH with urinary retention post Foley cath insertion Follow-up with urology outpatient Continue Terazosin and dutasteride  Chronic depression/anxiety Continue Paxil and Xanax as needed  Hyperlipidemia Continue Zocor  Hypertension Blood pressures well controlled Continue atenolol  Ambulatory dysfunction PT assessed and recommended home health PT Continue physical therapy Fall precautions   Code Status: Full code   Consultants:  None  Procedures:  Renal ultrasound  Antimicrobials:  Keflex  DVT prophylaxis: Subcu heparin 3 times daily   Discharge Exam: BP (!) 161/58 (BP Location: Left Arm)   Pulse 67   Temp 98.3 F (36.8 C) (Oral)   Resp 18   Ht 5\' 7"  (1.702 m)   Wt 72.1 kg   SpO2 96%   BMI 24.90 kg/m  . General: 84 y.o. year-old male well developed  well nourished in no acute distress.  Alert and oriented x3.  Very hard of hearing. . Cardiovascular: Regular rate and rhythm with no rubs or gallops.  No thyromegaly or JVD  noted.   Marland Kitchen Respiratory: Clear to auscultation with no wheezes or rales. Good inspiratory effort. . Abdomen: Soft nontender nondistended with normal bowel sounds x4 quadrants. . Musculoskeletal: No lower extremity edema. 2/4 pulses in all 4 extremities. . Skin: No ulcerative lesions noted or rashes, . Psychiatry: Mood is appropriate for condition and setting  Discharge Instructions You were cared for by a hospitalist during your hospital stay. If you have any questions about your discharge medications or the care you received while you were in the hospital after you are discharged, you can call the unit and asked to speak with the hospitalist on call if the hospitalist that took care of you is not available. Once you are discharged, your primary care physician will handle any further medical issues. Please note that NO REFILLS for any discharge medications will be authorized once you are discharged, as it is imperative that you return to your primary care physician (or establish a relationship with a primary care physician if you do not have one) for your aftercare needs so that they can reassess your need for medications and monitor your lab values.   Allergies as of 08/15/2018   No Known Allergies     Medication List    TAKE these medications   ALPRAZolam 0.25 MG tablet Commonly known as:  XANAX Take 0.25 mg by mouth 2 (two) times daily as needed for anxiety.   aspirin EC 81 MG tablet Take 81 mg by mouth daily.   atenolol 50 MG tablet Commonly known as:  TENORMIN Take 50 mg by mouth daily.   cephALEXin 500 MG capsule Commonly known as:  KEFLEX Take 1 capsule (500 mg total) by mouth 3 (three) times daily for 7 days.   diclofenac sodium 1 % Gel Commonly known as:  VOLTAREN Apply 4 g topically 4 (four) times daily.   dutasteride 0.5 MG capsule Commonly known as:  AVODART Take 0.5 mg by mouth daily.   metFORMIN 500 MG 24 hr tablet Commonly known as:  GLUCOPHAGE-XR Take 500 mg  by mouth daily with breakfast.   methylphenidate 20 MG tablet Commonly known as:  RITALIN Take 20 mg by mouth 2 (two) times daily with breakfast and lunch.   PARoxetine 40 MG tablet Commonly known as:  PAXIL Take 40 mg by mouth every morning.   simvastatin 20 MG tablet Commonly known as:  ZOCOR Take 20 mg by mouth every morning.   terazosin 10 MG capsule Commonly known as:  HYTRIN Take 10 mg by mouth at bedtime.      No Known Allergies Follow-up Information    Delorise Shiner, MD. Call in 1 day(s).   Specialty:  Family Medicine Why:  Please call for a post hospital follow-up appointment on Monday, 08/16/2018. Contact information: [address] [place] [address] [phone]        Festus Aloe, MD. Call in 1 day(s).   Specialty:  Urology Why:  Please call on Monday 08/16/18 to schedule a follow up appointment. Contact information: [place] [address] [phone]            The results of significant diagnostics from this hospitalization (including imaging, microbiology, ancillary and laboratory) are listed below for reference.    Significant Diagnostic Studies: US Renal  Result Date: 08/13/2018 CLINICAL  DATA:  Acute renal insufficiency. EXAM: RENAL / URINARY TRACT ULTRASOUND COMPLETE COMPARISON:  None. FINDINGS: Right Kidney: Renal measurements: 10.9 x 5.4 x 5.3 cm = volume: 161 mL . Echogenicity within normal limits. No mass visualized. Mild pelviectasis. Left Kidney: Renal measurements: 11.4 x 6.4 x 7.1 cm = volume: 271 mL. Echogenicity within normal limits. No mass or hydronephrosis visualized. Bladder: Bladder is decompressed around urinary Foley. The prostate gland is enlarged measuring 7.1 x 4.7 x 5.9 cm. IMPRESSION: Mild right renal pelviectasis. Normal appearance of the left kidney. Decompressed urinary bladder, which is not well characterized. Enlarged prostate gland.  Please correlate clinically. Electronically Signed   By: Fidela Salisbury M.D.   On: 08/13/2018 20:43    Microbiology: No results found for this or any previous visit (from the past 240 hour(s)).   Labs: Basic Metabolic Panel: Recent Labs  Lab 08/13/18 1218 08/13/18 1500 08/14/18 0544 08/15/18 0445  NA 133* 137 141  --   K 5.8* 4.8 3.8  --   CL 96* 99 112*  --   CO2 23 24 24   --   GLUCOSE 173* 134* 163*  --   BUN 116* 101* 45*  --   CREATININE 10.29* 8.11* 2.51* 1.00  CALCIUM 8.2* 8.1* 7.8*  --    Liver Function Tests: Recent Labs  Lab 08/13/18 1218  AST 16  ALT 12  ALKPHOS 61  BILITOT 0.6  PROT 6.6  ALBUMIN 3.2*   No results for input(s): LIPASE, AMYLASE in the last 168 hours. No results for input(s): AMMONIA in the last 168 hours. CBC: Recent Labs  Lab 08/13/18 1218 08/14/18 0544  WBC 13.0* 8.3  NEUTROABS 11.4*  --   HGB 10.3* 8.9*  HCT 32.3* 28.1*  MCV 92.8 91.5  PLT 255 231   Cardiac Enzymes: Recent Labs  Lab 08/13/18 1218  TROPONINI <0.03   BNP: BNP (last 3 results) No results for input(s): BNP in the last 8760 hours.  ProBNP (last 3 results) No results for input(s): PROBNP in the last 8760 hours.  CBG: Recent Labs  Lab 08/13/18 2254 08/14/18 0732 08/14/18 1135 08/14/18 1626 08/15/18 0738  GLUCAP 173* 150* 140* 149* 150*       Signed:  Kayleen Memos, MD [facility] 08/15/2018, 11:32 AM

## 2018-08-15 NOTE — Discharge Instructions (Signed)
Acute Urinary Retention, Male Acute urinary retention is when you are unable to pee (urinate). Acute urinary retention is common in older men. Prostates can get bigger, which blocks the flow of pee. Follow these instructions at home:  Drink enough fluids to keep your pee clear or pale yellow.  If you are sent home with a tube that drains the bladder (catheter), there will be a drainage bag attached to it. There are two types of bags. One is big that you can wear at night without having to empty it. One is smaller and needs to be emptied more often. ? Keep the drainage bag empty. ? Keep the drainage bag lower than your catheter.  Only take medicine as told by your doctor. Contact a doctor if:  You have a low-grade fever.  You have spasms or you are leaking pee when you have spasms. Get help right away if:  You have chills or a fever.  Your catheter stops draining pee.  Your catheter falls out.  You have increased bleeding that does not stop after you have rested and increased the amount of fluids you had been drinking. This information is not intended to replace advice given to you by your health care provider. Make sure you discuss any questions you have with your health care provider. Document Released: 03/17/2008 Document Revised: 03/06/2016 Document Reviewed: 03/10/2013 Elsevier Interactive Patient Education  2017 [employer]   Urinary Tract Infection, Adult A urinary tract infection (UTI) is an infection of any part of the urinary tract. The urinary tract includes the:  Kidneys.  Ureters.  Bladder.  Urethra.  These organs make, store, and get rid of pee (urine) in the body. Follow these instructions at home:  Take over-the-counter and prescription medicines only as told by your doctor.  If you were prescribed an antibiotic medicine, take it as told by your doctor. Do not stop taking the antibiotic even if you start to feel better.  Avoid the following  drinks: ? Alcohol. ? Caffeine. ? Tea. ? Carbonated drinks.  Drink enough fluid to keep your pee clear or pale yellow.  Keep all follow-up visits as told by your doctor. This is important.  Make sure to: ? Empty your bladder often and completely. Do not to hold pee for long periods of time. ? Empty your bladder before and after sex. ? Wipe from front to back after a bowel movement if you are male. Use each tissue one time when you wipe. Contact a doctor if:  You have back pain.  You have a fever.  You feel sick to your stomach (nauseous).  You throw up (vomit).  Your symptoms do not get better after 3 days.  Your symptoms go away and then come back. Get help right away if:  You have very bad back pain.  You have very bad lower belly (abdominal) pain.  You are throwing up and cannot keep down any medicines or water. This information is not intended to replace advice given to you by your health care provider. Make sure you discuss any questions you have with your health care provider. Document Released: 03/17/2008 Document Revised: 03/06/2016 Document Reviewed: 08/20/2015 Elsevier Interactive Patient Education  2018 [employer].   Indwelling Urinary Catheter Insertion  For people with certain conditions, urine is not able to move normally through the part of the body that drains urine from the bladder (urethra). An indwelling urinary catheter is a thin, sterile tube (catheter) that is placed (inserted) into the  bladder through the urethra to help drain urine out of the body. After the catheter is inserted, it is held in place by a small balloon on the catheter that is filled with sterile water. Urine drains from the catheter into a drainage bag outside of the body. An indwelling urinary catheter may be needed if you have urinary retention problems or bladder obstruction. It may also be needed during and after surgical procedures and for other medical conditions. Tell a  health care provider about:  Any allergies you have.  Any surgeries you have had.  Any medical conditions you have.  Any blood disorders you have. What are the risks? Generally, this is a safe procedure. However, problems may occur, including:  Infection.  Bleeding.  Damage to other structures or organs.  What happens before the procedure?  Your health care provider will inspect your urethra before inserting the catheter. What happens during the procedure?  To lower your risk of infection: ? Your health care team will wash or sanitize their hands. ? Your skin will be washed with soap.  A lubricant will be placed on the catheter to ease the insertion of it into the urethra.  The catheter will be inserted into the urethra until you can see urine in the drainage bag. After the urine starts to flow, the catheter may be inserted another couple of inches (about 5 cm).  Sterile water will be used to inflate the balloon to hold the catheter in place.  After the catheter balloon is inflated, it will be pulled back so it is against the narrow opening at the end of the bladder.  Your health care provider will check for urine flow into the drainage bag. The procedure may vary among health care providers and hospitals. What happens after the procedure?  Urine in the drainage bag will be emptied and measured by your health care provider while you are in the hospital.  Your health care provider will remove the catheter for you. This will be done when the catheter is no longer necessary, which is likely to be before you leave the hospital. Summary  An indwelling catheter is a sterile tube that is placed into the bladder through the urethra to help drain urine out of the body.  The catheter will be removed as soon as it is no longer necessary. This information is not intended to replace advice given to you by your health care provider. Make sure you discuss any questions you have with  your health care provider. Document Released: 11/08/2016 Document Revised: 11/08/2016 Document Reviewed: 11/08/2016 Elsevier Interactive Patient Education  2018 [place].   Indwelling Urinary Catheter Insertion, Care After This sheet gives you information about how to care for yourself after your procedure. Your health care provider may also give you more specific instructions. If you have problems or questions, contact your health care provider. What can I expect after the procedure? After the procedure, it is common to have:  Slight discomfort around your urethra where the catheter enters your body.  Follow these instructions at home:  Keep the drainage bag at or below the level of your bladder. Doing this ensures that urine can only drain out, not back into your body.  Secure the catheter tubing and drainage bag to your leg or thigh to keep it from moving.  Check the catheter tubing regularly to make sure there are no kinks or blockages.  Take showers daily to keep the catheter clean. Do not take a  bath.  Do not pull on your catheter or try to remove it.  Disconnect the tubing and drainage bag as little as possible.  Empty the drainage bag every 2-4 hours, or more often if needed. Do not let the bag get completely full.  Wash your hands with soap and water before and after touching the catheter, tubing, or drainage bag.  Do not let the drainage bag or catheter tubing touch the floor.  Drink enough fluids to keep your urine clear or pale yellow, or as told by your health care provider. Contact a health care provider if:  Urine stops flowing into the drainage bag.  You feel pain or pressure in the bladder area.  You have back pain.  Your catheter gets clogged.  Your catheter starts to leak.  Your urine looks cloudy.  Your drainage bag or tubing looks dirty.  You notice a bad smell when emptying your drainage bag. Get help right away if:  You have a fever or  chills.  You have severe pain in your back or your lower abdomen.  You have warmth, redness, swelling, or pain in the urethra area.  You notice blood in your urine.  Your catheter gets pulled out. Summary  Do not pull on your catheter or try to remove it.  Keep the drainage bag at or below the level of your bladder, but do not let the drainage bag or catheter tubing touch the floor.  Wash your hands with soap and water before and after touching the catheter, tubing, or drainage bag.  Contact your health care provider if you have a fever, chills, or any other signs of infection. This information is not intended to replace advice given to you by your health care provider. Make sure you discuss any questions you have with your health care provider. Document Released: 11/08/2016 Document Revised: 11/08/2016 Document Reviewed: 11/08/2016 Elsevier Interactive Patient Education  [employer].

## 2018-08-15 NOTE — Care Management Note (Signed)
Case Management Note  Patient Details  Name: Angel Barton MRN: [id number] Date of Birth: [birth year]  Subjective/Objective:                    Action/Plan:  Spoke with patient and family at bedside. They would like to use [facility]. Referral made. No other CM needs.   Expected Discharge Date:  08/15/18               Expected Discharge Plan:  [place]  In-House Referral:     Discharge planning Services  CM Consult  Post Acute Care Choice:  Home Health Choice offered to:  Spouse  DME Arranged:    DME Agency:     HH Arranged:  PT HH Agency:  [facility]  Status of Service:  Completed, signed off  If discussed at [place] of Stay Meetings, dates discussed:    Additional Comments:  Carles Collet, RN 08/15/2018, 1:20 PM

## 2018-08-15 NOTE — Progress Notes (Signed)
Physical Therapy Treatment Patient Details Name: Angel Barton MRN: [id number] DOB: 09/17/1926 Today's Date: 08/15/2018    History of Present Illness Angel Barton is a 84 y.o. male with medical history significant for aortic valve replacement TAVR, nephrolithiasis, BPH, CKD 5 who presented to [facility] ED with complaints of urinary retention    PT Comments    Pt progressing well with mobility. Pt continues to demonstrate good potential for d/c home with [facility] with further mobilization while in acute care. He ambulated 125 feet min guard/min assist with RW demonstrating mildly unsteady gait.  Pt positioned in recliner with feet elevated at end of session. Pt motivated to participate in therapy and pleased with his progress today.   Follow Up Recommendations  Home health PT;Other (comment);Supervision for mobility/OOB(maximize [facility] services)     Equipment Recommendations  None recommended by PT    Recommendations for Other Services       Precautions / Restrictions Precautions Precautions: Fall    Mobility  Bed Mobility Overal bed mobility: Modified Independent             General bed mobility comments: increased time and effort  Transfers Overall transfer level: Needs assistance Equipment used: Rolling walker (2 wheeled) Transfers: Sit to/from Stand Sit to Stand: Min guard         General transfer comment: cues for hand placement, increased time and effort  Ambulation/Gait Ambulation/Gait assistance: Min guard;Min assist Gait Distance (Feet): 125 Feet Assistive device: Rolling walker (2 wheeled) Gait Pattern/deviations: Step-through pattern;Decreased stride length Gait velocity: decreased Gait velocity interpretation: <1.8 ft/sec, indicate of risk for recurrent falls General Gait Details: min guard for ambulation straight, open path. Min assist to manage RW during turns and in tight spaces. Parkinson-like gait/difficulty initiating steps during turns and  around obstacles.    Stairs             Wheelchair Mobility    Modified Rankin (Stroke Patients Only)       Balance Overall balance assessment: Mild deficits observed, not formally tested                                          Cognition Arousal/Alertness: Awake/alert Behavior During Therapy: WFL for tasks assessed/performed Overall Cognitive Status: Within Functional Limits for tasks assessed                                 General Comments: very HOH      Exercises      General Comments        Pertinent Vitals/Pain Pain Assessment: No/denies pain    Home Living                      Prior Function            PT Goals (current goals can now be found in the care plan section) Acute Rehab PT Goals Patient Stated Goal: go home  PT Goal Formulation: With patient/family Potential to Achieve Goals: Good Progress towards PT goals: Progressing toward goals    Frequency    Min 3X/week      PT Plan Current plan remains appropriate    Co-evaluation              AM-PAC PT "6 Clicks" Daily Activity  Outcome Measure  Difficulty  turning over in bed (including adjusting bedclothes, sheets and blankets)?: A Little Difficulty moving from lying on back to sitting on the side of the bed? : A Little Difficulty sitting down on and standing up from a chair with arms (e.g., wheelchair, bedside commode, etc,.)?: A Little Help needed moving to and from a bed to chair (including a wheelchair)?: A Little Help needed walking in hospital room?: A Little Help needed climbing 3-5 steps with a railing? : A Lot 6 Click Score: 17    End of Session Equipment Utilized During Treatment: Gait belt Activity Tolerance: Patient tolerated treatment well Patient left: in chair;with chair alarm set;with call bell/phone within reach Nurse Communication: Mobility status PT Visit Diagnosis: Unsteadiness on feet (R26.81)     Time:  6063-0160 PT Time Calculation (min) (ACUTE ONLY): 24 min  Charges:  $Gait Training: 23-37 mins                     Lorrin Goodell, PT  Office # [phone] Pager [phone]    Lorriane Shire 08/15/2018, 9:29 AM

## 2018-08-16 DIAGNOSIS — F909 Attention-deficit hyperactivity disorder, unspecified type: Secondary | ICD-10-CM | POA: Diagnosis not present

## 2018-08-16 DIAGNOSIS — M25552 Pain in left hip: Secondary | ICD-10-CM | POA: Diagnosis not present

## 2018-08-16 DIAGNOSIS — N401 Enlarged prostate with lower urinary tract symptoms: Secondary | ICD-10-CM | POA: Diagnosis not present

## 2018-08-16 DIAGNOSIS — M79605 Pain in left leg: Secondary | ICD-10-CM | POA: Diagnosis not present

## 2018-08-16 DIAGNOSIS — M5136 Other intervertebral disc degeneration, lumbar region: Secondary | ICD-10-CM | POA: Diagnosis not present

## 2018-08-16 DIAGNOSIS — E119 Type 2 diabetes mellitus without complications: Secondary | ICD-10-CM | POA: Diagnosis not present

## 2018-08-17 DIAGNOSIS — R338 Other retention of urine: Secondary | ICD-10-CM | POA: Diagnosis not present

## 2018-08-17 DIAGNOSIS — N401 Enlarged prostate with lower urinary tract symptoms: Secondary | ICD-10-CM | POA: Diagnosis not present

## 2018-08-18 DIAGNOSIS — N390 Urinary tract infection, site not specified: Secondary | ICD-10-CM | POA: Diagnosis not present

## 2018-08-18 DIAGNOSIS — R269 Unspecified abnormalities of gait and mobility: Secondary | ICD-10-CM | POA: Diagnosis not present

## 2018-08-18 DIAGNOSIS — Z960 Presence of urogenital implants: Secondary | ICD-10-CM | POA: Diagnosis not present

## 2018-08-18 DIAGNOSIS — Z09 Encounter for follow-up examination after completed treatment for conditions other than malignant neoplasm: Secondary | ICD-10-CM | POA: Diagnosis not present

## 2018-08-18 DIAGNOSIS — N401 Enlarged prostate with lower urinary tract symptoms: Secondary | ICD-10-CM | POA: Diagnosis not present

## 2018-08-19 DIAGNOSIS — M25552 Pain in left hip: Secondary | ICD-10-CM | POA: Diagnosis not present

## 2018-08-19 DIAGNOSIS — M5136 Other intervertebral disc degeneration, lumbar region: Secondary | ICD-10-CM | POA: Diagnosis not present

## 2018-08-19 DIAGNOSIS — N401 Enlarged prostate with lower urinary tract symptoms: Secondary | ICD-10-CM | POA: Diagnosis not present

## 2018-08-19 DIAGNOSIS — M79605 Pain in left leg: Secondary | ICD-10-CM | POA: Diagnosis not present

## 2018-08-19 DIAGNOSIS — E119 Type 2 diabetes mellitus without complications: Secondary | ICD-10-CM | POA: Diagnosis not present

## 2018-08-19 DIAGNOSIS — F909 Attention-deficit hyperactivity disorder, unspecified type: Secondary | ICD-10-CM | POA: Diagnosis not present

## 2018-08-19 LAB — CULTURE, BLOOD (ROUTINE X 2)
Culture: NO GROWTH
Culture: NO GROWTH
Special Requests: ADEQUATE

## 2018-08-23 ENCOUNTER — Ambulatory Visit: Payer: Medicare Other | Admitting: Family Medicine

## 2018-08-23 DIAGNOSIS — M25552 Pain in left hip: Secondary | ICD-10-CM | POA: Diagnosis not present

## 2018-08-23 DIAGNOSIS — E119 Type 2 diabetes mellitus without complications: Secondary | ICD-10-CM | POA: Diagnosis not present

## 2018-08-23 DIAGNOSIS — N401 Enlarged prostate with lower urinary tract symptoms: Secondary | ICD-10-CM | POA: Diagnosis not present

## 2018-08-23 DIAGNOSIS — M79605 Pain in left leg: Secondary | ICD-10-CM | POA: Diagnosis not present

## 2018-08-23 DIAGNOSIS — F909 Attention-deficit hyperactivity disorder, unspecified type: Secondary | ICD-10-CM | POA: Diagnosis not present

## 2018-08-23 DIAGNOSIS — M5136 Other intervertebral disc degeneration, lumbar region: Secondary | ICD-10-CM | POA: Diagnosis not present

## 2018-08-25 DIAGNOSIS — E119 Type 2 diabetes mellitus without complications: Secondary | ICD-10-CM | POA: Diagnosis not present

## 2018-08-25 DIAGNOSIS — M25552 Pain in left hip: Secondary | ICD-10-CM | POA: Diagnosis not present

## 2018-08-25 DIAGNOSIS — M79605 Pain in left leg: Secondary | ICD-10-CM | POA: Diagnosis not present

## 2018-08-25 DIAGNOSIS — F909 Attention-deficit hyperactivity disorder, unspecified type: Secondary | ICD-10-CM | POA: Diagnosis not present

## 2018-08-25 DIAGNOSIS — N401 Enlarged prostate with lower urinary tract symptoms: Secondary | ICD-10-CM | POA: Diagnosis not present

## 2018-08-25 DIAGNOSIS — M5136 Other intervertebral disc degeneration, lumbar region: Secondary | ICD-10-CM | POA: Diagnosis not present

## 2018-08-26 DIAGNOSIS — R338 Other retention of urine: Secondary | ICD-10-CM | POA: Diagnosis not present

## 2018-08-31 DIAGNOSIS — N401 Enlarged prostate with lower urinary tract symptoms: Secondary | ICD-10-CM | POA: Diagnosis not present

## 2018-08-31 DIAGNOSIS — M25552 Pain in left hip: Secondary | ICD-10-CM | POA: Diagnosis not present

## 2018-08-31 DIAGNOSIS — M79605 Pain in left leg: Secondary | ICD-10-CM | POA: Diagnosis not present

## 2018-08-31 DIAGNOSIS — E119 Type 2 diabetes mellitus without complications: Secondary | ICD-10-CM | POA: Diagnosis not present

## 2018-08-31 DIAGNOSIS — R338 Other retention of urine: Secondary | ICD-10-CM | POA: Diagnosis not present

## 2018-08-31 DIAGNOSIS — N312 Flaccid neuropathic bladder, not elsewhere classified: Secondary | ICD-10-CM | POA: Diagnosis not present

## 2018-08-31 DIAGNOSIS — M5136 Other intervertebral disc degeneration, lumbar region: Secondary | ICD-10-CM | POA: Diagnosis not present

## 2018-08-31 DIAGNOSIS — F909 Attention-deficit hyperactivity disorder, unspecified type: Secondary | ICD-10-CM | POA: Diagnosis not present

## 2018-09-01 DIAGNOSIS — Z23 Encounter for immunization: Secondary | ICD-10-CM | POA: Diagnosis not present

## 2018-09-01 DIAGNOSIS — H6123 Impacted cerumen, bilateral: Secondary | ICD-10-CM | POA: Diagnosis not present

## 2018-09-02 DIAGNOSIS — N401 Enlarged prostate with lower urinary tract symptoms: Secondary | ICD-10-CM | POA: Diagnosis not present

## 2018-09-02 DIAGNOSIS — M5136 Other intervertebral disc degeneration, lumbar region: Secondary | ICD-10-CM | POA: Diagnosis not present

## 2018-09-02 DIAGNOSIS — M79605 Pain in left leg: Secondary | ICD-10-CM | POA: Diagnosis not present

## 2018-09-02 DIAGNOSIS — F909 Attention-deficit hyperactivity disorder, unspecified type: Secondary | ICD-10-CM | POA: Diagnosis not present

## 2018-09-02 DIAGNOSIS — E119 Type 2 diabetes mellitus without complications: Secondary | ICD-10-CM | POA: Diagnosis not present

## 2018-09-02 DIAGNOSIS — M25552 Pain in left hip: Secondary | ICD-10-CM | POA: Diagnosis not present

## 2018-09-04 DIAGNOSIS — N390 Urinary tract infection, site not specified: Secondary | ICD-10-CM | POA: Diagnosis not present

## 2018-09-04 DIAGNOSIS — M5136 Other intervertebral disc degeneration, lumbar region: Secondary | ICD-10-CM | POA: Diagnosis not present

## 2018-09-04 DIAGNOSIS — R339 Retention of urine, unspecified: Secondary | ICD-10-CM | POA: Diagnosis not present

## 2018-09-04 DIAGNOSIS — N401 Enlarged prostate with lower urinary tract symptoms: Secondary | ICD-10-CM | POA: Diagnosis not present

## 2018-09-04 DIAGNOSIS — Z7984 Long term (current) use of oral hypoglycemic drugs: Secondary | ICD-10-CM | POA: Diagnosis not present

## 2018-09-04 DIAGNOSIS — E119 Type 2 diabetes mellitus without complications: Secondary | ICD-10-CM | POA: Diagnosis not present

## 2018-09-06 ENCOUNTER — Other Ambulatory Visit: Payer: Self-pay | Admitting: Urology

## 2018-09-06 DIAGNOSIS — R339 Retention of urine, unspecified: Secondary | ICD-10-CM | POA: Diagnosis not present

## 2018-09-06 DIAGNOSIS — N390 Urinary tract infection, site not specified: Secondary | ICD-10-CM | POA: Diagnosis not present

## 2018-09-06 DIAGNOSIS — E119 Type 2 diabetes mellitus without complications: Secondary | ICD-10-CM | POA: Diagnosis not present

## 2018-09-06 DIAGNOSIS — M5136 Other intervertebral disc degeneration, lumbar region: Secondary | ICD-10-CM | POA: Diagnosis not present

## 2018-09-06 DIAGNOSIS — Z7984 Long term (current) use of oral hypoglycemic drugs: Secondary | ICD-10-CM | POA: Diagnosis not present

## 2018-09-06 DIAGNOSIS — N401 Enlarged prostate with lower urinary tract symptoms: Secondary | ICD-10-CM | POA: Diagnosis not present

## 2018-09-08 NOTE — Addendum Note (Signed)
Addended by: Link Snuffer D III on: 09/08/2018 08:34 AM   Modules accepted: Orders

## 2018-09-13 DIAGNOSIS — N401 Enlarged prostate with lower urinary tract symptoms: Secondary | ICD-10-CM | POA: Diagnosis not present

## 2018-09-13 DIAGNOSIS — Z7984 Long term (current) use of oral hypoglycemic drugs: Secondary | ICD-10-CM | POA: Diagnosis not present

## 2018-09-13 DIAGNOSIS — E119 Type 2 diabetes mellitus without complications: Secondary | ICD-10-CM | POA: Diagnosis not present

## 2018-09-13 DIAGNOSIS — M5136 Other intervertebral disc degeneration, lumbar region: Secondary | ICD-10-CM | POA: Diagnosis not present

## 2018-09-13 DIAGNOSIS — N390 Urinary tract infection, site not specified: Secondary | ICD-10-CM | POA: Diagnosis not present

## 2018-09-13 DIAGNOSIS — R339 Retention of urine, unspecified: Secondary | ICD-10-CM | POA: Diagnosis not present

## 2018-09-14 ENCOUNTER — Encounter (HOSPITAL_COMMUNITY): Payer: Self-pay

## 2018-09-14 DIAGNOSIS — N390 Urinary tract infection, site not specified: Secondary | ICD-10-CM | POA: Diagnosis not present

## 2018-09-14 DIAGNOSIS — Z7984 Long term (current) use of oral hypoglycemic drugs: Secondary | ICD-10-CM | POA: Diagnosis not present

## 2018-09-14 DIAGNOSIS — N401 Enlarged prostate with lower urinary tract symptoms: Secondary | ICD-10-CM | POA: Diagnosis not present

## 2018-09-14 DIAGNOSIS — E119 Type 2 diabetes mellitus without complications: Secondary | ICD-10-CM | POA: Diagnosis not present

## 2018-09-14 DIAGNOSIS — R339 Retention of urine, unspecified: Secondary | ICD-10-CM | POA: Diagnosis not present

## 2018-09-14 DIAGNOSIS — M5136 Other intervertebral disc degeneration, lumbar region: Secondary | ICD-10-CM | POA: Diagnosis not present

## 2018-09-14 NOTE — Pre-Procedure Instructions (Signed)
The following are in epic: Last office visit note Dr. Geraldo Pitter 01/15/2018 EKG 08/13/2018 Hgb A1C 6.5 08/13/2018 ECHO 02/15/2018

## 2018-09-14 NOTE — Patient Instructions (Signed)
Your procedure is scheduled on: Monday, Dec. 9, 2019   Surgery Time:  12:59PM-2:14 PM   Report to [facility]  Entrance    Report to admitting at 11:00 AM   Call this number if you have problems the morning of surgery [phone]   Do not eat food:After Midnight.   May have liquids until 7:00AM day of surgery  CLEAR LIQUID DIET  Foods Allowed                                                                     Foods Excluded  Coffee and tea, regular and decaf                             liquids that you cannot  Plain Jell-O in any flavor                                             see through such as: Fruit ices (not with fruit pulp)                                     milk, soups, orange juice  Iced Popsicles                                    All solid food Carbonated beverages, regular and diet                                    Cranberry, grape and apple juices Sports drinks like Gatorade Lightly seasoned clear broth or consume(fat free) Sugar, honey syrup  Sample Menu Breakfast                                Lunch                                     Supper Cranberry juice                    Beef broth                            Chicken broth Jell-O                                     Grape juice                           Apple juice Coffee or tea  Jell-O                                      Popsicle                                                Coffee or tea                        Coffee or tea    Brush your teeth the morning of surgery.   Do NOT smoke after Midnight   Take these medicines the morning of surgery with A SIP OF WATER: Atenolol, Paroxetine, Xanax if needed  DO NOT TAKE ANY DIABETIC MEDICATIONS DAY OF YOUR SURGERY                               You may not have any metal on your body including jewelry, and body piercings             Do not wear lotions, powders, perfumes/cologne, or deodorant                     Men may shave face and neck.   Do not bring valuables to the hospital. [place].   Contacts, dentures or bridgework may not be worn into surgery.   Leave suitcase in the car. After surgery it may be brought to your room.    Special Instructions: Bring a copy of your healthcare power of attorney and living will documents         the day of surgery if you haven't scanned them in before.              Please read over the following fact sheets you were given:  [facility] - Preparing for Surgery Before surgery, you can play an important role.  Because skin is not sterile, your skin needs to be as free of germs as possible.  You can reduce the number of germs on your skin by washing with CHG (chlorahexidine gluconate) soap before surgery.  CHG is an antiseptic cleaner which kills germs and bonds with the skin to continue killing germs even after washing. Please DO NOT use if you have an allergy to CHG or antibacterial soaps.  If your skin becomes reddened/irritated stop using the CHG and inform your nurse when you arrive at Short Stay. Do not shave (including legs and underarms) for at least 48 hours prior to the first CHG shower.  You may shave your face/neck.  Please follow these instructions carefully:  1.  Shower with CHG Soap the night before surgery and the  morning of surgery.  2.  If you choose to wash your hair, wash your hair first as usual with your normal  shampoo.  3.  After you shampoo, rinse your hair and body thoroughly to remove the shampoo.                             4.  Use CHG as you would any other liquid soap.  You can apply chg directly to the skin and wash.  Gently with a scrungie or clean washcloth.  5.  Apply the CHG Soap to your body ONLY FROM THE NECK DOWN.   Do   not use on face/ open                           Wound or open sores. Avoid contact with eyes, ears mouth and   genitals (private parts).                        Wash face,  Genitals (private parts) with your normal soap.             6.  Wash thoroughly, paying special attention to the area where your    surgery  will be performed.  7.  Thoroughly rinse your body with warm water from the neck down.  8.  DO NOT shower/wash with your normal soap after using and rinsing off the CHG Soap.                9.  Pat yourself dry with a clean towel.            10.  Wear clean pajamas.            11.  Place clean sheets on your bed the night of your first shower and do not  sleep with pets. Day of Surgery : Do not apply any lotions/deodorants the morning of surgery.  Please wear clean clothes to the hospital/surgery center.  FAILURE TO FOLLOW THESE INSTRUCTIONS MAY RESULT IN THE CANCELLATION OF YOUR SURGERY  PATIENT SIGNATURE_________________________________  NURSE SIGNATURE__________________________________  ________________________________________________________________________  WHAT IS A BLOOD TRANSFUSION? Blood Transfusion Information  A transfusion is the replacement of blood or some of its parts. Blood is made up of multiple cells which provide different functions.  Red blood cells carry oxygen and are used for blood loss replacement.  White blood cells fight against infection.  Platelets control bleeding.  Plasma helps clot blood.  Other blood products are available for specialized needs, such as hemophilia or other clotting disorders. BEFORE THE TRANSFUSION  Who gives blood for transfusions?   Healthy volunteers who are fully evaluated to make sure their blood is safe. This is blood bank blood. Transfusion therapy is the safest it has ever been in the practice of medicine. Before blood is taken from a donor, a complete history is taken to make sure that person has no history of diseases nor engages in risky social behavior (examples are intravenous drug use or sexual activity with multiple partners). The donor's travel history is  screened to minimize risk of transmitting infections, such as malaria. The donated blood is tested for signs of infectious diseases, such as HIV and hepatitis. The blood is then tested to be sure it is compatible with you in order to minimize the chance of a transfusion reaction. If you or a relative donates blood, this is often done in anticipation of surgery and is not appropriate for emergency situations. It takes many days to process the donated blood. RISKS AND COMPLICATIONS Although transfusion therapy is very safe and saves many lives, the main dangers of transfusion include:   Getting an infectious disease.  Developing a transfusion reaction. This is an allergic reaction to something in the blood you were given. Every precaution is taken to prevent this. The decision to have a blood transfusion  has been considered carefully by your caregiver before blood is given. Blood is not given unless the benefits outweigh the risks. AFTER THE TRANSFUSION  Right after receiving a blood transfusion, you will usually feel much better and more energetic. This is especially true if your red blood cells have gotten low (anemic). The transfusion raises the level of the red blood cells which carry oxygen, and this usually causes an energy increase.  The nurse administering the transfusion will monitor you carefully for complications. HOME CARE INSTRUCTIONS  No special instructions are needed after a transfusion. You may find your energy is better. Speak with your caregiver about any limitations on activity for underlying diseases you may have. SEEK MEDICAL CARE IF:   Your condition is not improving after your transfusion.  You develop redness or irritation at the intravenous (IV) site. SEEK IMMEDIATE MEDICAL CARE IF:  Any of the following symptoms occur over the next 12 hours:  Shaking chills.  You have a temperature by mouth above 102 F (38.9 C), not controlled by medicine.  Chest, back, or muscle  pain.  People around you feel you are not acting correctly or are confused.  Shortness of breath or difficulty breathing.  Dizziness and fainting.  You get a rash or develop hives.  You have a decrease in urine output.  Your urine turns a dark color or changes to pink, red, or brown. Any of the following symptoms occur over the next 10 days:  You have a temperature by mouth above 102 F (38.9 C), not controlled by medicine.  Shortness of breath.  Weakness after normal activity.  The white part of the eye turns yellow (jaundice).  You have a decrease in the amount of urine or are urinating less often.  Your urine turns a dark color or changes to pink, red, or brown. Document Released: 09/26/2000 Document Revised: 12/22/2011 Document Reviewed: 05/15/2008 [facility] Patient Information 2014 [place], [place].  _______________________________________________________________________

## 2018-09-14 NOTE — Pre-Procedure Instructions (Signed)
Spoke with Pam about getting cardiac clearance for surgery on 09/20/2018.

## 2018-09-15 ENCOUNTER — Other Ambulatory Visit: Payer: Self-pay

## 2018-09-15 ENCOUNTER — Encounter (HOSPITAL_COMMUNITY): Payer: Self-pay

## 2018-09-15 ENCOUNTER — Telehealth: Payer: Self-pay

## 2018-09-15 ENCOUNTER — Encounter (HOSPITAL_COMMUNITY)
Admission: RE | Admit: 2018-09-15 | Discharge: 2018-09-15 | Disposition: A | Discharge status: Home / Self Care | Payer: Medicare Other | Source: Ambulatory Visit | Attending: Urology | Admitting: Urology

## 2018-09-15 DIAGNOSIS — E119 Type 2 diabetes mellitus without complications: Secondary | ICD-10-CM | POA: Insufficient documentation

## 2018-09-15 DIAGNOSIS — I447 Left bundle-branch block, unspecified: Secondary | ICD-10-CM | POA: Insufficient documentation

## 2018-09-15 DIAGNOSIS — Z01812 Encounter for preprocedural laboratory examination: Secondary | ICD-10-CM | POA: Diagnosis not present

## 2018-09-15 DIAGNOSIS — Z953 Presence of xenogenic heart valve: Secondary | ICD-10-CM | POA: Insufficient documentation

## 2018-09-15 DIAGNOSIS — R339 Retention of urine, unspecified: Secondary | ICD-10-CM | POA: Insufficient documentation

## 2018-09-15 HISTORY — DX: Type 2 diabetes mellitus without complications: E11.9

## 2018-09-15 HISTORY — DX: Essential (primary) hypertension: I10

## 2018-09-15 HISTORY — DX: Anxiety disorder, unspecified: F41.9

## 2018-09-15 HISTORY — DX: Hyperlipidemia, unspecified: E78.5

## 2018-09-15 HISTORY — DX: Calculus of kidney: N20.0

## 2018-09-15 HISTORY — DX: Left bundle-branch block, unspecified: I44.7

## 2018-09-15 HISTORY — DX: Depression, unspecified: F32.A

## 2018-09-15 HISTORY — DX: Major depressive disorder, single episode, unspecified: F32.9

## 2018-09-15 HISTORY — DX: Heart disease, unspecified: I51.9

## 2018-09-15 HISTORY — DX: Chronic kidney disease, stage 5: N18.5

## 2018-09-15 HISTORY — DX: Retention of urine, unspecified: R33.9

## 2018-09-15 HISTORY — DX: Gastro-esophageal reflux disease without esophagitis: K21.9

## 2018-09-15 HISTORY — DX: Localized edema: R60.0

## 2018-09-15 HISTORY — DX: Cardiomegaly: I51.7

## 2018-09-15 HISTORY — DX: Unspecified osteoarthritis, unspecified site: M19.90

## 2018-09-15 LAB — BASIC METABOLIC PANEL
Anion gap: 8 (ref 5–15)
BUN: 23 mg/dL (ref 8–23)
CO2: 27 mmol/L (ref 22–32)
Calcium: 8.6 mg/dL — ABNORMAL LOW (ref 8.9–10.3)
Chloride: 104 mmol/L (ref 98–111)
Creatinine, Ser: 1.05 mg/dL (ref 0.61–1.24)
GFR calc Af Amer: >60 mL/min (ref >60)
GFR calc non Af Amer: >60 mL/min (ref >60)
Glucose, Bld: 122 mg/dL — ABNORMAL HIGH (ref 70–99)
Potassium: 4.6 mmol/L (ref 3.5–5.1)
Sodium: 139 mmol/L (ref 135–145)

## 2018-09-15 LAB — CBC
HCT: 36.6 % — ABNORMAL LOW (ref 39.0–52.0)
Hemoglobin: 11.8 g/dL — ABNORMAL LOW (ref 13.0–17.0)
MCH: 30.7 pg (ref 26.0–34.0)
MCHC: 32.2 g/dL (ref 30.0–36.0)
MCV: 95.3 fL (ref 80.0–100.0)
Platelets: 226 10*3/uL (ref 150–400)
RBC: 3.84 MIL/uL — ABNORMAL LOW (ref 4.22–5.81)
RDW: 12.6 % (ref 11.5–15.5)
WBC: 8.3 10*3/uL (ref 4.0–10.5)
nRBC: 0 % (ref 0.0–0.2)

## 2018-09-15 LAB — GLUCOSE, CAPILLARY: Glucose-Capillary: 130 mg/dL — ABNORMAL HIGH (ref 70–99)

## 2018-09-15 LAB — PROTIME-INR
INR: 1
Prothrombin Time: 13.1 seconds (ref 11.4–15.2)

## 2018-09-15 NOTE — Pre-Procedure Instructions (Signed)
CBC and BMP results 09/15/2018 sent to Dr. Gloriann Loan via epic.

## 2018-09-15 NOTE — Telephone Encounter (Signed)
Patient is to have TURP on Monday 09/20/18. Per Pam patient needs to have cardiac clearance which she was just informed of by Mosetta Putt is needed. An appointment was set for tomorrow per Pam for tomorrow at 11:00 am. She will confirm with this patient if the time is okay and call if we need to reschedule.

## 2018-09-16 ENCOUNTER — Encounter: Payer: Self-pay | Admitting: Cardiology

## 2018-09-16 ENCOUNTER — Ambulatory Visit (INDEPENDENT_AMBULATORY_CARE_PROVIDER_SITE_OTHER): Payer: Medicare Other | Admitting: Cardiology

## 2018-09-16 ENCOUNTER — Telehealth (HOSPITAL_COMMUNITY): Payer: Self-pay | Admitting: *Deleted

## 2018-09-16 VITALS — BP 112/58 | HR 56 | Ht 67.0 in | Wt 157.0 lb

## 2018-09-16 DIAGNOSIS — Z01810 Encounter for preprocedural cardiovascular examination: Secondary | ICD-10-CM | POA: Diagnosis not present

## 2018-09-16 DIAGNOSIS — N390 Urinary tract infection, site not specified: Secondary | ICD-10-CM | POA: Diagnosis not present

## 2018-09-16 DIAGNOSIS — Z09 Encounter for follow-up examination after completed treatment for conditions other than malignant neoplasm: Secondary | ICD-10-CM

## 2018-09-16 DIAGNOSIS — E119 Type 2 diabetes mellitus without complications: Secondary | ICD-10-CM | POA: Diagnosis not present

## 2018-09-16 DIAGNOSIS — Z7984 Long term (current) use of oral hypoglycemic drugs: Secondary | ICD-10-CM | POA: Diagnosis not present

## 2018-09-16 DIAGNOSIS — M5136 Other intervertebral disc degeneration, lumbar region: Secondary | ICD-10-CM | POA: Diagnosis not present

## 2018-09-16 DIAGNOSIS — N401 Enlarged prostate with lower urinary tract symptoms: Secondary | ICD-10-CM | POA: Diagnosis not present

## 2018-09-16 DIAGNOSIS — Z953 Presence of xenogenic heart valve: Secondary | ICD-10-CM

## 2018-09-16 DIAGNOSIS — Z952 Presence of prosthetic heart valve: Secondary | ICD-10-CM | POA: Diagnosis not present

## 2018-09-16 DIAGNOSIS — R339 Retention of urine, unspecified: Secondary | ICD-10-CM | POA: Diagnosis not present

## 2018-09-16 LAB — ABO/RH: ABO/RH(D): A NEG

## 2018-09-16 NOTE — Telephone Encounter (Signed)
Patient given detailed instructions per Myocardial Perfusion Study Information Sheet for the test on 09/17/18 at 7:30. Patient notified to arrive 15 minutes early and that it is imperative to arrive on time for appointment to keep from having the test rescheduled.  If you need to cancel or reschedule your appointment, please call the office within 24 hours of your appointment. . Patient verbalized understanding.Angel Barton

## 2018-09-16 NOTE — Progress Notes (Signed)
Cardiology Office Note:    Date:  09/16/2018   ID:  Angel Barton, DOB 03/20/1926, MRN [id number]  PCP:  Delorise Shiner, MD  Cardiologist:  Jenean Lindau, MD   Referring MD: Delorise Shiner, MD    ASSESSMENT:    1. Preoperative cardiovascular examination   2. S/p TAVR (transcatheter aortic valve replacement), bioprosthetic   3. Encounter for follow-up for aortic valve replacement    PLAN:    In order of problems listed above:  1. Preoperative stratification process was discussed with the patient at length.  It is of concern to me that the patient has multiple risk factors for coronary artery disease.  He is 84 years old and frail he undergoes rehab without much of symptoms at home.  He is recently recovering from a urinary tract infection.  I am concerned about his coronary artery status and the risk of coronary events during the aforementioned surgery.  His coronary anatomy and status is unclear to me at this time and especially because he has history of diabetes mellitus I discussed with him about preop risk stratification and Lexiscan sestamibi testing.  He is agreeable.  If this is negative then his heart at very high risk for coronary events during the aforementioned surgery.  Meticulous hemodynamic monitoring and continued perioperative beta-blockade will further reduce the risk of coronary events.  His blood pressure is stable at this time.   Medication Adjustments/Labs and Tests Ordered: Current medicines are reviewed at length with the patient today.  Concerns regarding medicines are outlined above.  Orders Placed This Encounter  Procedures  . MYOCARDIAL PERFUSION IMAGING  . EKG 12-Lead   No orders of the defined types were placed in this encounter.    No chief complaint on file.    History of Present Illness:    Angel Barton is a 84 y.o. male.  The patient has history of transcatheter aortic valve replacement surgery.  He has history of essential hypertension and  diabetes mellitus.  He leads a sedentary lifestyle at home.  He is planning to undergo TURP surgery and here for preop risk assessment.  He denies any chest pain orthopnea or PND.  He ambulates little and has a catheter because of urinary tract obstruction.  It is to me that he had urosepsis and was treated subsequently is done fine.  Past Medical History:  Diagnosis Date  . ADD (attention deficit disorder)   . Anxiety   . Arthritis    left leg  . BPH (benign prostatic hyperplasia)   . CKD (chronic kidney disease), stage V ([place]) 08/14/2018   pt unaware  . Depression   . Diabetes mellitus without complication ([place])   . Does use hearing aid   . GERD (gastroesophageal reflux disease)   . Grade I diastolic dysfunction 60/63/0160   Noted on ECHO  . Hyperlipidemia   . Hypertension   . Kidney stones   . LAE (left atrial enlargement) 02/15/2018   Moderate, Noted on ECHO  . Left bundle branch block 08/13/2018   Noted on EKG   . LVH (left ventricular hypertrophy) 02/15/2018   Mild, Noted on ECHO  . Nephrolithiasis 08/14/2018  . Nocturia   . Pedal edema 02/2018  . Urinary retention 08/14/2018    Past Surgical History:  Procedure Laterality Date  . AORTIC VALVE REPLACEMENT  06/26/2017  . JOINT REPLACEMENT    . right hip replacement  1994    Current Medications: Current Meds  Medication Sig  . ALPRAZolam Duanne Moron)  0.25 MG tablet Take 0.25 mg by mouth 2 (two) times daily as needed for anxiety.   Marland Kitchen aspirin EC 81 MG tablet Take 81 mg by mouth daily.  Marland Kitchen atenolol (TENORMIN) 50 MG tablet Take 50 mg by mouth daily.  . Cyanocobalamin (VITAMIN B 12 PO) Take by mouth daily.  . metFORMIN (GLUCOPHAGE-XR) 500 MG 24 hr tablet Take 500 mg by mouth daily with breakfast.  . methylphenidate (RITALIN) 20 MG tablet Take 20 mg by mouth daily.   Marland Kitchen PARoxetine (PAXIL) 40 MG tablet Take 40 mg by mouth every morning.  . simvastatin (ZOCOR) 20 MG tablet Take 20 mg by mouth every morning.   . terazosin  (HYTRIN) 10 MG capsule Take 10 mg by mouth at bedtime.     Allergies:   Patient has no known allergies.   Social History   Socioeconomic History  . Marital status: Widowed    Spouse name: Not on file  . Number of children: Not on file  . Years of education: Not on file  . Highest education level: Not on file  Occupational History  . Not on file  Social Needs  . Financial resource strain: Not on file  . Food insecurity:    Worry: Not on file    Inability: Not on file  . Transportation needs:    Medical: Not on file    Non-medical: Not on file  Tobacco Use  . Smoking status: Former Research scientist (life sciences)  . Smokeless tobacco: Never Used  Substance and Sexual Activity  . Alcohol use: Never    Frequency: Never  . Drug use: Never  . Sexual activity: Not on file  Lifestyle  . Physical activity:    Days per week: Not on file    Minutes per session: Not on file  . Stress: Not on file  Relationships  . Social connections:    Talks on phone: Not on file    Gets together: Not on file    Attends religious service: Not on file    Active member of club or organization: Not on file    Attends meetings of clubs or organizations: Not on file    Relationship status: Not on file  Other Topics Concern  . Not on file  Social History Narrative  . Not on file     Family History: The patient's family history includes Prostate cancer in his father.  ROS:   Please see the history of present illness.    All other systems reviewed and are negative.  EKGs/Labs/Other Studies Reviewed:    The following studies were reviewed today: EKG reveals sinus rhythm and nonspecific ST-T changes and intraventricular conduction delay.   Recent Labs: 08/13/2018: ALT 12 09/15/2018: BUN 23; Creatinine, Ser 1.05; Hemoglobin 11.8; Platelets 226; Potassium 4.6; Sodium 139  Recent Lipid Panel No results found for: CHOL, TRIG, HDL, CHOLHDL, VLDL, LDLCALC, LDLDIRECT  Physical Exam:    VS:  BP (!) 112/58 (BP  Location: Right Arm, Patient Position: Sitting, Cuff Size: Normal)   Pulse (!) 56   Ht 5\' 7"  (1.702 m)   Wt 157 lb (71.2 kg)   SpO2 98%   BMI 24.59 kg/m     Wt Readings from Last 3 Encounters:  09/16/18 157 lb (71.2 kg)  09/15/18 155 lb 2 oz (70.4 kg)  08/15/18 158 lb 15.2 oz (72.1 kg)     GEN: Patient is in no acute distress HEENT: Normal NECK: No JVD; No carotid bruits LYMPHATICS: No lymphadenopathy CARDIAC: Hear sounds  regular, 2/6 systolic murmur at the apex. RESPIRATORY:  Clear to auscultation without rales, wheezing or rhonchi  ABDOMEN: Soft, non-tender, non-distended MUSCULOSKELETAL:  No edema; No deformity  SKIN: Warm and dry NEUROLOGIC:  Alert and oriented x 3 PSYCHIATRIC:  Normal affect   Signed, Jenean Lindau, MD  09/16/2018 12:06 PM    Rafael Capo

## 2018-09-16 NOTE — Patient Instructions (Signed)
Medication Instructions:  Your physician recommends that you continue on your current medications as directed. Please refer to the Current Medication list given to you today.  If you need a refill on your cardiac medications before your next appointment, please call your pharmacy.   Lab work: None  If you have labs (blood work) drawn today and your tests are completely normal, you will receive your results only by: Marland Kitchen MyChart Message (if you have MyChart) OR . A paper copy in the mail If you have any lab test that is abnormal or we need to change your treatment, we will call you to review the results.  Testing/Procedures: Your physician has requested that you have a lexiscan myoview. For further information please visit [email or web address]. Please follow instruction sheet, as given.  Follow-Up: At [facility], you and your health needs are our priority.  As part of our continuing mission to provide you with exceptional heart care, we have created designated Provider Care Teams.  These Care Teams include your primary Cardiologist (physician) and Advanced Practice Providers (APPs -  Physician Assistants and Nurse Practitioners) who all work together to provide you with the care you need, when you need it.  You will need a follow up appointment in 6 months.  Please call our office 2 months in advance to schedule this appointment.  You may see another member of our [employer] Provider Team in [place]: Jenne Campus, MD . Shirlee More, MD  Any Other Special Instructions Will Be Listed Below (If Applicable).

## 2018-09-17 ENCOUNTER — Ambulatory Visit (HOSPITAL_BASED_OUTPATIENT_CLINIC_OR_DEPARTMENT_OTHER): Payer: Medicare Other

## 2018-09-17 ENCOUNTER — Other Ambulatory Visit (HOSPITAL_COMMUNITY): Payer: Medicare Other

## 2018-09-17 ENCOUNTER — Telehealth: Payer: Self-pay | Admitting: Cardiology

## 2018-09-17 DIAGNOSIS — Z953 Presence of xenogenic heart valve: Secondary | ICD-10-CM | POA: Diagnosis not present

## 2018-09-17 DIAGNOSIS — I447 Left bundle-branch block, unspecified: Secondary | ICD-10-CM | POA: Diagnosis not present

## 2018-09-17 DIAGNOSIS — R339 Retention of urine, unspecified: Secondary | ICD-10-CM | POA: Diagnosis not present

## 2018-09-17 DIAGNOSIS — Z01818 Encounter for other preprocedural examination: Secondary | ICD-10-CM

## 2018-09-17 DIAGNOSIS — Z01810 Encounter for preprocedural cardiovascular examination: Secondary | ICD-10-CM

## 2018-09-17 DIAGNOSIS — E119 Type 2 diabetes mellitus without complications: Secondary | ICD-10-CM | POA: Diagnosis not present

## 2018-09-17 DIAGNOSIS — Z01812 Encounter for preprocedural laboratory examination: Secondary | ICD-10-CM | POA: Diagnosis not present

## 2018-09-17 LAB — MYOCARDIAL PERFUSION IMAGING
LV dias vol: 103 mL (ref 62–150)
LV sys vol: 36 mL
Peak HR: 71 {beats}/min
Rest HR: 49 {beats}/min
SDS: 3
SRS: 5
SSS: 8
TID: 0.91

## 2018-09-17 MED ORDER — TECHNETIUM TC 99M TETROFOSMIN IV KIT
31.4000 | PACK | Freq: Once | INTRAVENOUS | Status: AC | PRN
  Administered 2018-09-17: 31.4 via INTRAVENOUS
  Filled 2018-09-17: qty 32

## 2018-09-17 MED ORDER — REGADENOSON 0.4 MG/5ML IV SOLN
0.4000 mg | Freq: Once | INTRAVENOUS | Status: AC
  Administered 2018-09-17: 0.4 mg via INTRAVENOUS

## 2018-09-17 MED ORDER — TECHNETIUM TC 99M TETROFOSMIN IV KIT
10.8000 | PACK | Freq: Once | INTRAVENOUS | Status: AC | PRN
  Administered 2018-09-17: 10.8 via INTRAVENOUS
  Filled 2018-09-17: qty 11

## 2018-09-17 NOTE — Pre-Procedure Instructions (Signed)
Spoke with Pam about status of cardiac clearance.  She will contact Dr. Geraldo Pitter.

## 2018-09-17 NOTE — Pre-Procedure Instructions (Signed)
Spoke with Mr. Pile son informed him Dr. Kalman Shan has given the OK to proceed with procedure Dec. 9, 2019.

## 2018-09-17 NOTE — Pre-Procedure Instructions (Signed)
Unable to get in contact with Dr. Julien Nordmann office, voicemail states they are in a meeting.  Dr. Kalman Shan reviewed Dr. Julien Nordmann office note 15/02/2018 and Mr. Gorter Stress test result 09/17/2018. Per Dr. Kalman Shan Mr. Pettie is cleared for surgery 09/20/2018.

## 2018-09-17 NOTE — Telephone Encounter (Signed)
New Message   Patient son is calling in reference to the results of the stress test today. Patient is scheduled to have surgery Monday so his is concerned that his father will not be cleared.

## 2018-09-19 NOTE — H&P (Signed)
CC/HPI: Cc: Urinary retention  HPI:  08/17/2018  84 year old male presents for history of urinary retention. He recently was hospitalized with a urinary tract infection as well as urinary retention. Foley catheter was placed with return of 1.6 L. Renal bladder ultrasound at that time showed mild right hydronephrosis and a 100 g prostate. He had acute renal insufficiency that soon improved to baseline after catheter placement. He has BPH at baseline and currently takes dutasteride and terazosin. He has no other complaints today.   08/31/2018:  The last visit, the patient underwent a cystoscopy which revealed evidence of prostatic obstruction as expected. He also had a large capacity bladder with low sensation. He recently underwent a urodynamics which revealed a high capacity bladder, decreased sensation, and a hypotonic bladder. He was able in great small unsustained contractions up to a pressure of 26. He has no complaints today.     ALLERGIES: No Allergies    MEDICATIONS: Cipro 500 mg tablet 1 tablet PO BID  Metformin Hcl  Simvastatin  Aspir 81  Atenolol  Cephalexin  Dutasteride  Paroxetine Hcl  Ritalin  Vitamin B12     GU PSH: Complex cystometrogram, w/ void pressure and urethral pressure profile studies, any technique - 08/26/2018 Complex Uroflow - 08/26/2018 Cystoscopy - 08/17/2018 Emg surf Electrd - 08/26/2018 Inject For cystogram - 08/26/2018 Intrabd voidng Press - 08/26/2018    NON-GU PSH: Heart Surgery (Unspecified) Hip Arthroscopy/surgery, Right        GU PMH: BPH w/LUTS - 08/17/2018 Urinary Retention - 08/17/2018 Renal calculus    NON-GU PMH: Anxiety Arthritis Diabetes Type 2    FAMILY HISTORY: 1 Daughter - Other 2 sons - Other Dementia - Mother Prostate Cancer - Father   SOCIAL HISTORY: Marital Status: Married Preferred Language: English; Race: White Current Smoking Status: Patient does not smoke anymore.  <DIV'  Tobacco Use Assessment Completed:  Used  Tobacco in last 30 days?   Drinks 1 caffeinated drink per day.    REVIEW OF SYSTEMS:     GU Review Male:  Patient denies frequent urination, hard to postpone urination, burning/ pain with urination, get up at night to urinate, leakage of urine, stream starts and stops, trouble starting your stream, have to strain to urinate , erection problems, and penile pain.    Gastrointestinal (Upper):  Patient denies nausea, vomiting, and indigestion/ heartburn.    Gastrointestinal (Lower):  Patient denies diarrhea and constipation.    Constitutional:  Patient denies fever, night sweats, weight loss, and fatigue.    Skin:  Patient denies skin rash/ lesion and itching.    Eyes:  Patient denies blurred vision and double vision.    Ears/ Nose/ Throat:  Patient denies sore throat and sinus problems.    Hematologic/Lymphatic:  Patient denies easy bruising and swollen glands.    Cardiovascular:  Patient denies leg swelling and chest pains.    Respiratory:  Patient denies cough and shortness of breath.    Endocrine:  Patient denies excessive thirst.    Musculoskeletal:  Patient denies back pain and joint pain.    Neurological:  Patient denies headaches and dizziness.    Psychologic:  Patient denies depression and anxiety.    VITAL SIGNS: None     MULTI-SYSTEM PHYSICAL EXAMINATION:      Constitutional: Elderly male in no acute distress     Respiratory: No labored breathing, no use of accessory muscles.      Cardiovascular: Normal temperature, adequate perfusion of extremities     Skin: No  paleness, no jaundice     Neurologic / Psychiatric: Oriented to time, oriented to place, oriented to person. No depression, no anxiety, no agitation.     Gastrointestinal: No mass, no tenderness, no rigidity, non obese abdomen.     Eyes: Normal conjunctivae. Normal eyelids.     Musculoskeletal: Normal gait and station of head and neck.            PAST DATA REVIEWED:   Source Of History:  Patient  Records Review:   Previous Patient Records   PROCEDURES: None   ASSESSMENT:     ICD-10 Details  1 GU:  BPH w/LUTS - N40.1   2  Urinary Retention - R33.8   3  Areflexic bladder - N31.2    PLAN:   Schedule  Return Visit/Planned Activity: 3 Weeks - Nurse Visit  Note: Catheter change  Document  Letter(s):  Created for Patient: Clinical Summary   Notes:  Patient generates a small unsustained contractions. I gave him the option of transurethral resection of prostate in an effort to try to get him without a catheter. I did inform him that there is a good chance that this would not work for him. Also gave him the options of continuing a Foley catheter versus suprapubic tube. They would like to discuss this with the family.   Cc: Rikki Spearing, DO    Signed by Link Snuffer, III, M.D. on 08/31/18 at 9:55 AM (EST

## 2018-09-20 ENCOUNTER — Ambulatory Visit (HOSPITAL_COMMUNITY): Payer: Medicare Other | Admitting: Anesthesiology

## 2018-09-20 ENCOUNTER — Encounter (HOSPITAL_COMMUNITY)
Admission: RE | Disposition: A | Discharge status: Home / Self Care | Payer: Self-pay | Source: Ambulatory Visit | Attending: Urology

## 2018-09-20 ENCOUNTER — Encounter (HOSPITAL_COMMUNITY): Payer: Self-pay | Admitting: *Deleted

## 2018-09-20 ENCOUNTER — Observation Stay (HOSPITAL_COMMUNITY)
Admission: RE | Admit: 2018-09-20 | Discharge: 2018-09-21 | Disposition: A | Discharge status: Home / Self Care | Payer: Medicare Other | Source: Ambulatory Visit | Attending: Urology | Admitting: Urology

## 2018-09-20 ENCOUNTER — Other Ambulatory Visit: Payer: Self-pay

## 2018-09-20 DIAGNOSIS — N312 Flaccid neuropathic bladder, not elsewhere classified: Secondary | ICD-10-CM | POA: Diagnosis not present

## 2018-09-20 DIAGNOSIS — Z79899 Other long term (current) drug therapy: Secondary | ICD-10-CM | POA: Insufficient documentation

## 2018-09-20 DIAGNOSIS — Z7984 Long term (current) use of oral hypoglycemic drugs: Secondary | ICD-10-CM | POA: Insufficient documentation

## 2018-09-20 DIAGNOSIS — Z87891 Personal history of nicotine dependence: Secondary | ICD-10-CM | POA: Diagnosis not present

## 2018-09-20 DIAGNOSIS — E119 Type 2 diabetes mellitus without complications: Secondary | ICD-10-CM | POA: Diagnosis not present

## 2018-09-20 DIAGNOSIS — I10 Essential (primary) hypertension: Secondary | ICD-10-CM | POA: Diagnosis not present

## 2018-09-20 DIAGNOSIS — N138 Other obstructive and reflux uropathy: Secondary | ICD-10-CM | POA: Insufficient documentation

## 2018-09-20 DIAGNOSIS — F909 Attention-deficit hyperactivity disorder, unspecified type: Secondary | ICD-10-CM | POA: Diagnosis not present

## 2018-09-20 DIAGNOSIS — R339 Retention of urine, unspecified: Secondary | ICD-10-CM | POA: Diagnosis present

## 2018-09-20 DIAGNOSIS — N320 Bladder-neck obstruction: Secondary | ICD-10-CM | POA: Diagnosis not present

## 2018-09-20 DIAGNOSIS — R338 Other retention of urine: Secondary | ICD-10-CM | POA: Diagnosis not present

## 2018-09-20 DIAGNOSIS — N401 Enlarged prostate with lower urinary tract symptoms: Principal | ICD-10-CM | POA: Insufficient documentation

## 2018-09-20 DIAGNOSIS — F329 Major depressive disorder, single episode, unspecified: Secondary | ICD-10-CM | POA: Insufficient documentation

## 2018-09-20 DIAGNOSIS — K219 Gastro-esophageal reflux disease without esophagitis: Secondary | ICD-10-CM | POA: Insufficient documentation

## 2018-09-20 HISTORY — PX: TRANSURETHRAL RESECTION OF PROSTATE: SHX73

## 2018-09-20 LAB — GLUCOSE, CAPILLARY
Glucose-Capillary: 103 mg/dL — ABNORMAL HIGH (ref 70–99)
Glucose-Capillary: 176 mg/dL — ABNORMAL HIGH (ref 70–99)

## 2018-09-20 LAB — TYPE AND SCREEN
ABO/RH(D): A NEG
Antibody Screen: NEGATIVE

## 2018-09-20 LAB — HEMOGLOBIN AND HEMATOCRIT, BLOOD
HCT: 31.7 % — ABNORMAL LOW (ref 39.0–52.0)
Hemoglobin: 10.1 g/dL — ABNORMAL LOW (ref 13.0–17.0)

## 2018-09-20 SURGERY — TURP (TRANSURETHRAL RESECTION OF PROSTATE)
Anesthesia: General

## 2018-09-20 MED ORDER — SIMVASTATIN 20 MG PO TABS
20.0000 mg | ORAL_TABLET | Freq: Every day | ORAL | Status: DC
  Administered 2018-09-21: 20 mg via ORAL
  Filled 2018-09-20: qty 1

## 2018-09-20 MED ORDER — CEFAZOLIN SODIUM-DEXTROSE 2-4 GM/100ML-% IV SOLN
2.0000 g | INTRAVENOUS | Status: AC
  Administered 2018-09-20: 2 g via INTRAVENOUS
  Filled 2018-09-20: qty 100

## 2018-09-20 MED ORDER — SODIUM CHLORIDE 0.9 % IV SOLN
INTRAVENOUS | Status: DC
  Administered 2018-09-20 – 2018-09-21 (×2): via INTRAVENOUS

## 2018-09-20 MED ORDER — PROPOFOL 10 MG/ML IV BOLUS
INTRAVENOUS | Status: DC | PRN
  Administered 2018-09-20: 80 mg via INTRAVENOUS

## 2018-09-20 MED ORDER — PROPOFOL 10 MG/ML IV BOLUS
INTRAVENOUS | Status: AC
  Filled 2018-09-20: qty 20

## 2018-09-20 MED ORDER — BELLADONNA ALKALOIDS-OPIUM 16.2-60 MG RE SUPP: 1.0000 | Freq: Four times a day (QID) | RECTAL | Status: DC | PRN

## 2018-09-20 MED ORDER — OXYCODONE HCL 5 MG PO TABS: 5.0000 mg | ORAL_TABLET | ORAL | Status: DC | PRN

## 2018-09-20 MED ORDER — MORPHINE SULFATE (PF) 2 MG/ML IV SOLN: 2.0000 mg | INTRAVENOUS | Status: DC | PRN

## 2018-09-20 MED ORDER — ACETAMINOPHEN 325 MG PO TABS: 650.0000 mg | ORAL_TABLET | ORAL | Status: DC | PRN

## 2018-09-20 MED ORDER — EPHEDRINE 5 MG/ML INJ
INTRAVENOUS | Status: AC
  Filled 2018-09-20: qty 10

## 2018-09-20 MED ORDER — FENTANYL CITRATE (PF) 100 MCG/2ML IJ SOLN
INTRAMUSCULAR | Status: AC
  Filled 2018-09-20: qty 2

## 2018-09-20 MED ORDER — SENNA 8.6 MG PO TABS
1.0000 | ORAL_TABLET | Freq: Two times a day (BID) | ORAL | Status: DC
  Administered 2018-09-20 – 2018-09-21 (×2): 8.6 mg via ORAL
  Filled 2018-09-20 (×2): qty 1

## 2018-09-20 MED ORDER — DOCUSATE SODIUM 100 MG PO CAPS
100.0000 mg | ORAL_CAPSULE | Freq: Two times a day (BID) | ORAL | Status: DC
  Administered 2018-09-20 – 2018-09-21 (×2): 100 mg via ORAL
  Filled 2018-09-20 (×2): qty 1

## 2018-09-20 MED ORDER — FENTANYL CITRATE (PF) 100 MCG/2ML IJ SOLN: 25.0000 ug | INTRAMUSCULAR | Status: DC | PRN

## 2018-09-20 MED ORDER — EPHEDRINE SULFATE-NACL 50-0.9 MG/10ML-% IV SOSY
PREFILLED_SYRINGE | INTRAVENOUS | Status: DC | PRN
  Administered 2018-09-20 (×2): 10 mg via INTRAVENOUS
  Administered 2018-09-20: 5 mg via INTRAVENOUS
  Administered 2018-09-20: 10 mg via INTRAVENOUS

## 2018-09-20 MED ORDER — INSULIN ASPART 100 UNIT/ML ~~LOC~~ SOLN
0.0000 [IU] | Freq: Three times a day (TID) | SUBCUTANEOUS | Status: DC
  Administered 2018-09-21: 3 [IU] via SUBCUTANEOUS

## 2018-09-20 MED ORDER — SODIUM CHLORIDE 0.9 % IR SOLN
3000.0000 mL | Status: DC
  Administered 2018-09-20: 3000 mL

## 2018-09-20 MED ORDER — SODIUM CHLORIDE 0.9 % IR SOLN
Status: DC | PRN
  Administered 2018-09-20: 36000 mL

## 2018-09-20 MED ORDER — OXYBUTYNIN CHLORIDE 5 MG PO TABS: 5.0000 mg | ORAL_TABLET | Freq: Three times a day (TID) | ORAL | Status: DC | PRN

## 2018-09-20 MED ORDER — SODIUM CHLORIDE 0.9 % IR SOLN
3000.0000 mL | Status: DC
  Administered 2018-09-20 (×2): 3000 mL

## 2018-09-20 MED ORDER — PAROXETINE HCL 20 MG PO TABS
40.0000 mg | ORAL_TABLET | ORAL | Status: DC
  Administered 2018-09-21: 40 mg via ORAL
  Filled 2018-09-20: qty 2

## 2018-09-20 MED ORDER — FENTANYL CITRATE (PF) 100 MCG/2ML IJ SOLN
INTRAMUSCULAR | Status: DC | PRN
  Administered 2018-09-20 (×3): 25 ug via INTRAVENOUS

## 2018-09-20 MED ORDER — BACITRACIN-NEOMYCIN-POLYMYXIN 400-5-5000 EX OINT: 1.0000 "application " | TOPICAL_OINTMENT | Freq: Three times a day (TID) | CUTANEOUS | Status: DC | PRN

## 2018-09-20 MED ORDER — ONDANSETRON HCL 4 MG/2ML IJ SOLN
INTRAMUSCULAR | Status: AC
  Filled 2018-09-20: qty 2

## 2018-09-20 MED ORDER — LACTATED RINGERS IV SOLN
INTRAVENOUS | Status: DC
  Administered 2018-09-20: 12:00:00 via INTRAVENOUS

## 2018-09-20 MED ORDER — LIDOCAINE 2% (20 MG/ML) 5 ML SYRINGE
INTRAMUSCULAR | Status: AC
  Filled 2018-09-20: qty 5

## 2018-09-20 MED ORDER — ALPRAZOLAM 0.25 MG PO TABS: 0.2500 mg | ORAL_TABLET | Freq: Two times a day (BID) | ORAL | Status: DC | PRN

## 2018-09-20 MED ORDER — LIDOCAINE 2% (20 MG/ML) 5 ML SYRINGE
INTRAMUSCULAR | Status: DC | PRN
  Administered 2018-09-20: 75 mg via INTRAVENOUS

## 2018-09-20 MED ORDER — DIPHENHYDRAMINE HCL 12.5 MG/5ML PO ELIX: 12.5000 mg | ORAL_SOLUTION | Freq: Four times a day (QID) | ORAL | Status: DC | PRN

## 2018-09-20 MED ORDER — TERAZOSIN HCL 5 MG PO CAPS
10.0000 mg | ORAL_CAPSULE | Freq: Every day | ORAL | Status: DC
  Administered 2018-09-20: 10 mg via ORAL
  Filled 2018-09-20: qty 2

## 2018-09-20 MED ORDER — ONDANSETRON HCL 4 MG/2ML IJ SOLN: 4.0000 mg | INTRAMUSCULAR | Status: DC | PRN

## 2018-09-20 MED ORDER — METHYLPHENIDATE HCL 5 MG PO TABS
20.0000 mg | ORAL_TABLET | Freq: Every day | ORAL | Status: DC
  Administered 2018-09-21: 20 mg via ORAL
  Filled 2018-09-20: qty 4

## 2018-09-20 MED ORDER — ATENOLOL 50 MG PO TABS
50.0000 mg | ORAL_TABLET | Freq: Every day | ORAL | Status: DC
  Administered 2018-09-21: 50 mg via ORAL
  Filled 2018-09-20: qty 1

## 2018-09-20 MED ORDER — DIPHENHYDRAMINE HCL 50 MG/ML IJ SOLN: 12.5000 mg | Freq: Four times a day (QID) | INTRAMUSCULAR | Status: DC | PRN

## 2018-09-20 SURGICAL SUPPLY — 21 items
BAG URINE DRAINAGE (UROLOGICAL SUPPLIES) ×2 IMPLANT
BAG URO CATCHER STRL LF (MISCELLANEOUS) ×2 IMPLANT
CATH FOLEY 3WAY 30CC 24FR (CATHETERS) ×1
CATH HEMA 3WAY 30CC 24FR COUDE (CATHETERS) ×1 IMPLANT
CATH URTH STD 24FR FL 3W 2 (CATHETERS) ×1 IMPLANT
CLOTH BEACON ORANGE TIMEOUT ST (SAFETY) ×2 IMPLANT
COVER WAND RF STERILE (DRAPES) IMPLANT
ELECT REM PT RETURN 15FT ADLT (MISCELLANEOUS) ×2 IMPLANT
GLOVE BIO SURGEON STRL SZ7.5 (GLOVE) ×2 IMPLANT
GOWN STRL REUS W/TWL LRG LVL3 (GOWN DISPOSABLE) ×2 IMPLANT
GOWN STRL REUS W/TWL XL LVL3 (GOWN DISPOSABLE) ×2 IMPLANT
HOLDER FOLEY CATH W/STRAP (MISCELLANEOUS) ×2 IMPLANT
LOOP CUT BIPOLAR 24F LRG (ELECTROSURGICAL) ×2 IMPLANT
MANIFOLD NEPTUNE II (INSTRUMENTS) ×2 IMPLANT
PACK CYSTO (CUSTOM PROCEDURE TRAY) ×2 IMPLANT
SET ASPIRATION TUBING (TUBING) ×2 IMPLANT
SYR 30ML LL (SYRINGE) ×1 IMPLANT
SYRINGE IRR TOOMEY STRL 70CC (SYRINGE) ×2 IMPLANT
TUBING CONNECTING 10 (TUBING) ×2 IMPLANT
TUBING UROLOGY SET (TUBING) ×2 IMPLANT
WATER STERILE IRR 250ML POUR (IV SOLUTION) ×1 IMPLANT

## 2018-09-20 NOTE — H&P (Signed)
/HPI: Cc: Urinary retention  HPI:  08/17/2018  84 year old male presents for history of urinary retention. He recently was hospitalized with a urinary tract infection as well as urinary retention. Foley catheter was placed with return of 1.6 L. Renal bladder ultrasound at that time showed mild right hydronephrosis and a 100 g prostate. He had acute renal insufficiency that soon improved to baseline after catheter placement. He has BPH at baseline and currently takes dutasteride and terazosin. He has no other complaints today.   08/31/2018:  The last visit, the patient underwent a cystoscopy which revealed evidence of prostatic obstruction as expected. He also had a large capacity bladder with low sensation. He recently underwent a urodynamics which revealed a high capacity bladder, decreased sensation, and a hypotonic bladder. He was able in great small unsustained contractions up to a pressure of 26. He has no complaints today.     ALLERGIES: No Allergies    MEDICATIONS: Cipro 500 mg tablet 1 tablet PO BID  Metformin Hcl  Simvastatin  Aspir 81  Atenolol  Cephalexin  Dutasteride  Paroxetine Hcl  Ritalin  Vitamin B12     GU PSH: Complex cystometrogram, w/ void pressure and urethral pressure profile studies, any technique - 08/26/2018 Complex Uroflow - 08/26/2018 Cystoscopy - 08/17/2018 Emg surf Electrd - 08/26/2018 Inject For cystogram - 08/26/2018 Intrabd voidng Press - 08/26/2018    NON-GU PSH: Heart Surgery (Unspecified) Hip Arthroscopy/surgery, Right        GU PMH: BPH w/LUTS - 08/17/2018 Urinary Retention - 08/17/2018 Renal calculus    NON-GU PMH: Anxiety Arthritis Diabetes Type 2    FAMILY HISTORY: 1 Daughter - Other 2 sons - Other Dementia - Mother Prostate Cancer - Father   SOCIAL HISTORY: Marital Status: Married Preferred Language: English; Race: White Current Smoking Status: Patient does not smoke anymore.  <DIV'  Tobacco Use Assessment Completed:  Used Tobacco  in last 30 days?   Drinks 1 caffeinated drink per day.    REVIEW OF SYSTEMS:     GU Review Male:  Patient denies frequent urination, hard to postpone urination, burning/ pain with urination, get up at night to urinate, leakage of urine, stream starts and stops, trouble starting your stream, have to strain to urinate , erection problems, and penile pain.    Gastrointestinal (Upper):  Patient denies nausea, vomiting, and indigestion/ heartburn.    Gastrointestinal (Lower):  Patient denies diarrhea and constipation.    Constitutional:  Patient denies fever, night sweats, weight loss, and fatigue.    Skin:  Patient denies skin rash/ lesion and itching.    Eyes:  Patient denies blurred vision and double vision.    Ears/ Nose/ Throat:  Patient denies sore throat and sinus problems.    Hematologic/Lymphatic:  Patient denies easy bruising and swollen glands.    Cardiovascular:  Patient denies leg swelling and chest pains.    Respiratory:  Patient denies cough and shortness of breath.    Endocrine:  Patient denies excessive thirst.    Musculoskeletal:  Patient denies back pain and joint pain.    Neurological:  Patient denies headaches and dizziness.    Psychologic:  Patient denies depression and anxiety.    VITAL SIGNS: None     MULTI-SYSTEM PHYSICAL EXAMINATION:      Constitutional: Elderly male in no acute distress     Respiratory: No labored breathing, no use of accessory muscles.      Cardiovascular: Normal temperature, adequate perfusion of extremities     Skin: No  paleness, no jaundice     Neurologic / Psychiatric: Oriented to time, oriented to place, oriented to person. No depression, no anxiety, no agitation.     Gastrointestinal: No mass, no tenderness, no rigidity, non obese abdomen.     Eyes: Normal conjunctivae. Normal eyelids.     Musculoskeletal: Normal gait and station of head and neck.            PAST DATA REVIEWED:   Source Of History:  Patient  Records Review:  Previous  Patient Records   PROCEDURES: None   ASSESSMENT:     ICD-10 Details  1 GU:  BPH w/LUTS - N40.1   2  Urinary Retention - R33.8   3  Areflexic bladder - N31.2    PLAN:   Schedule  Return Visit/Planned Activity: 3 Weeks - Nurse Visit  Note: Catheter change  Document  Letter(s):  Created for Patient: Clinical Summary   Notes:  Patient generates a small unsustained contractions. I gave him the option of transurethral resection of prostate in an effort to try to get him without a catheter. I did inform him that there is a good chance that this would not work for him. Also gave him the options of continuing a Foley catheter versus suprapubic tube. They would like to discuss this with the family.   Cc: Rikki Spearing, DO   Signed by Link Snuffer, III, M.D. on 08/31/18 at 9:55 AM (EST

## 2018-09-20 NOTE — Discharge Instructions (Signed)

## 2018-09-20 NOTE — Anesthesia Postprocedure Evaluation (Signed)
Anesthesia Post Note  Patient: Angel Barton  Procedure(s) Performed: TRANSURETHRAL RESECTION OF THE PROSTATE (TURP) (N/A )     Patient location during evaluation: PACU Anesthesia Type: General Level of consciousness: awake and alert Pain management: pain level controlled Vital Signs Assessment: post-procedure vital signs reviewed and stable Respiratory status: spontaneous breathing, nonlabored ventilation and respiratory function stable Cardiovascular status: blood pressure returned to baseline and stable Postop Assessment: no apparent nausea or vomiting Anesthetic complications: no    Last Vitals:  Vitals:   09/20/18 1645 09/20/18 1722  BP: (!) 160/75 (!) 156/65  Pulse: 70 (!) 57  Resp: 16 16  Temp: 36.6 C 36.6 C  SpO2: 100% 100%    Last Pain:  Vitals:   09/20/18 1645  TempSrc:   PainSc: 0-No pain                 Audry Pili

## 2018-09-20 NOTE — Telephone Encounter (Signed)
RRR patient :)

## 2018-09-20 NOTE — Telephone Encounter (Signed)
Confirmed with the son that the patient is in fact having surgery today.

## 2018-09-20 NOTE — Telephone Encounter (Signed)
Clearance has been faxed to Dr. Purvis Sheffield office.

## 2018-09-20 NOTE — Transfer of Care (Signed)
Immediate Anesthesia Transfer of Care Note  Patient: Angel Barton  Procedure(s) Performed: TRANSURETHRAL RESECTION OF THE PROSTATE (TURP) (N/A )  Patient Location: PACU  Anesthesia Type:General  Level of Consciousness: drowsy  Airway & Oxygen Therapy: Patient Spontanous Breathing and Patient connected to face mask oxygen  Post-op Assessment: Report given to RN and Post -op Vital signs reviewed and stable  Post vital signs: Reviewed and stable  Last Vitals:  Vitals Value Taken Time  BP    Temp    Pulse    Resp    SpO2      Last Pain:  Vitals:   09/20/18 1141  TempSrc:   PainSc: 0-No pain         Complications: No apparent anesthesia complications

## 2018-09-20 NOTE — Op Note (Signed)
Preoperative diagnosis: 1. Bladder outlet obstruction secondary to BPH  Postoperative diagnosis:  1. Bladder outlet obstruction secondary to BPH  Procedure:  1. Cystoscopy 2. Transurethral resection of the prostate  Surgeon: Marton Redwood, III. M.D.  Anesthesia: General  Complications: None  EBL: Minimal  Specimens: 1. Prostate chips  Indication: Angel Barton is a patient with bladder outlet obstruction secondary to benign prostatic hyperplasia. After reviewing the management options for treatment, he elected to proceed with the above surgical procedure(s). We have discussed the potential benefits and risks of the procedure, side effects of the proposed treatment, the likelihood of the patient achieving the goals of the procedure, and any potential problems that might occur during the procedure or recuperation. Informed consent has been obtained.  Description of procedure:  The patient was taken to the operating room and general anesthesia was induced.  The patient was placed in the dorsal lithotomy position, prepped and draped in the usual sterile fashion, and preoperative antibiotics were administered. A preoperative time-out was performed.   Cystourethroscopy was performed.  The patient's urethra was examined and was normal/ demonstrated trilobar prostatic hypertrophy with a median lobe.   The bladder was then systematically examined in its entirety. There was no evidence of any bladder tumors, stones, or other mucosal pathology.  The ureteral orifices were identified and marked so as to be avoided during the procedure.  The prostate adenoma was then resected utilizing loop cautery resection with the bipolar cutting loop.  The prostate adenoma from the bladder neck back to the verumontanum was resected beginning at the six o'clock position and then extended to include the right and left lobes of the prostate and anterior prostate. Care was taken not to resect distal to the  verumontanum.   Hemostasis was then achieved with the cautery and the bladder was emptied and reinspected with no significant bleeding noted at the end of the procedure.    A 16F 3 way catheter was then placed into the bladder.  The patient appeared to tolerate the procedure well and without complications.  The patient was able to be awakened and transferred to the recovery unit in satisfactory condition.

## 2018-09-20 NOTE — Anesthesia Procedure Notes (Signed)
Procedure Name: LMA Insertion Date/Time: 09/20/2018 2:07 PM Performed by: Anne Fu, CRNA Pre-anesthesia Checklist: Patient identified, Emergency Drugs available, Suction available, Patient being monitored and Timeout performed Patient Re-evaluated:Patient Re-evaluated prior to induction Oxygen Delivery Method: Circle system utilized Preoxygenation: Pre-oxygenation with 100% oxygen Induction Type: IV induction Ventilation: Mask ventilation without difficulty LMA: LMA inserted LMA Size: 4.0 Number of attempts: 1 Placement Confirmation: positive ETCO2 and breath sounds checked- equal and bilateral Tube secured with: Tape

## 2018-09-20 NOTE — Anesthesia Preprocedure Evaluation (Addendum)
Anesthesia Evaluation  Patient identified by MRN, date of birth, ID band Patient awake    Reviewed: Allergy & Precautions, NPO status , Patient's Chart, lab work & pertinent test results, reviewed documented beta blocker date and time   Airway Mallampati: I  TM Distance: >3 FB Neck ROM: Full    Dental  (+) Missing, Dental Advisory Given,    Pulmonary neg pulmonary ROS, former smoker,    breath sounds clear to auscultation       Cardiovascular hypertension, Pt. on medications and Pt. on home beta blockers + Valvular Problems/Murmurs (s/p TAVR) AS  Rhythm:Regular Rate:Normal  Stress Test 09/2018 Nuclear stress EF: 65%. Defect 1: There is a small defect of severe severity present in the apical inferior location. Findings consistent with prior myocardial infarction. This is a low risk study. The left ventricular ejection fraction is normal (55-65%).  TTE 02/2018 - Left ventricle: The cavity size was normal. Wall thickness was increased in a pattern of mild LVH. Systolic function was normal. The estimated ejection fraction was in the range of 55% to 60%. Wall motion was normal; there were no regional wall motion abnormalities. Doppler parameters are consistent with abnormal left ventricular relaxation (grade 1 diastolic dysfunction). - Mitral valve: Calcified annulus. - Left atrium: The atrium was mildly to moderately dilated. Impressions: - Normal LVEF.   MIld LVH.   MOderate LAE.   S/P TAVR with normal valve function (Peak/Mean 16/8,,Hg).   Neuro/Psych PSYCHIATRIC DISORDERS Anxiety Depression negative neurological ROS     GI/Hepatic Neg liver ROS, GERD  ,  Endo/Other  negative endocrine ROSdiabetes, Type 2, Oral Hypoglycemic Agents  Renal/GU CRFRenal disease  negative genitourinary   Musculoskeletal negative musculoskeletal ROS (+)   Abdominal   Peds  (+) ADHD Hematology negative hematology ROS (+)   Anesthesia  Other Findings Urinary retention  Reproductive/Obstetrics                            Anesthesia Physical Anesthesia Plan  ASA: III  Anesthesia Plan: General   Post-op Pain Management:    Induction: Intravenous  PONV Risk Score and Plan: 2 and Treatment may vary due to age or medical condition and Ondansetron  Airway Management Planned: LMA  Additional Equipment:   Intra-op Plan:   Post-operative Plan: Extubation in OR  Informed Consent: I have reviewed the patients History and Physical, chart, labs and discussed the procedure including the risks, benefits and alternatives for the proposed anesthesia with the patient or authorized representative who has indicated his/her understanding and acceptance.   Dental advisory given  Plan Discussed with: CRNA  Anesthesia Plan Comments:         Anesthesia Quick Evaluation

## 2018-09-21 ENCOUNTER — Encounter (HOSPITAL_COMMUNITY): Payer: Self-pay | Admitting: Urology

## 2018-09-21 DIAGNOSIS — I10 Essential (primary) hypertension: Secondary | ICD-10-CM | POA: Diagnosis not present

## 2018-09-21 DIAGNOSIS — N138 Other obstructive and reflux uropathy: Secondary | ICD-10-CM | POA: Diagnosis not present

## 2018-09-21 DIAGNOSIS — N401 Enlarged prostate with lower urinary tract symptoms: Secondary | ICD-10-CM | POA: Diagnosis not present

## 2018-09-21 DIAGNOSIS — N312 Flaccid neuropathic bladder, not elsewhere classified: Secondary | ICD-10-CM | POA: Diagnosis not present

## 2018-09-21 DIAGNOSIS — N320 Bladder-neck obstruction: Secondary | ICD-10-CM | POA: Diagnosis not present

## 2018-09-21 DIAGNOSIS — R338 Other retention of urine: Secondary | ICD-10-CM | POA: Diagnosis not present

## 2018-09-21 LAB — GLUCOSE, CAPILLARY
Glucose-Capillary: 153 mg/dL — ABNORMAL HIGH (ref 70–99)
Glucose-Capillary: 120 mg/dL — ABNORMAL HIGH (ref 70–99)

## 2018-09-21 LAB — HEMOGLOBIN AND HEMATOCRIT, BLOOD
HCT: 29.3 % — ABNORMAL LOW (ref 39.0–52.0)
Hemoglobin: 9.3 g/dL — ABNORMAL LOW (ref 13.0–17.0)

## 2018-09-21 NOTE — Progress Notes (Signed)
Discharge instructions reviewed with patient and patients wife. All questions answered. IV removed. Pt safely transported to car via wheelchair with all belongings.

## 2018-09-21 NOTE — Discharge Summary (Signed)
Physician Discharge Summary  Patient ID: Angel Barton MRN: [id number] DOB/AGE: 82/08/1926 82 y.o.  Admit date: 09/20/2018 Discharge date: 09/21/2018  Admission Diagnoses:  Discharge Diagnoses:  Active Problems:   Urinary retention   Discharged Condition: good  Hospital Course: Patient underwent a TURP on 09/20/2018.  He tolerated the procedure well and was weaned off CBI the following day and discard discharged in stable condition  Consults: None  Significant Diagnostic Studies: None  Treatments: surgery: TURP  Discharge Exam: Blood pressure (!) 135/52, pulse 61, temperature 97.8 F (36.6 C), temperature source Oral, resp. rate 16, height 5\' 7"  (1.702 m), weight 73.5 kg, SpO2 96 %. General appearance: alert no acute distress Three-way Foley catheter in place draining light red urine  Disposition:    Allergies as of 09/21/2018   No Known Allergies     Medication List    TAKE these medications   ALPRAZolam 0.25 MG tablet Commonly known as:  XANAX Take 0.25 mg by mouth 2 (two) times daily as needed for anxiety.   aspirin EC 81 MG tablet Take 81 mg by mouth daily.   atenolol 50 MG tablet Commonly known as:  TENORMIN Take 50 mg by mouth daily.   metFORMIN 500 MG 24 hr tablet Commonly known as:  GLUCOPHAGE-XR Take 500 mg by mouth daily with breakfast.   methylphenidate 20 MG tablet Commonly known as:  RITALIN Take 20 mg by mouth daily.   PARoxetine 40 MG tablet Commonly known as:  PAXIL Take 40 mg by mouth every morning.   simvastatin 20 MG tablet Commonly known as:  ZOCOR Take 20 mg by mouth every morning.   terazosin 10 MG capsule Commonly known as:  HYTRIN Take 10 mg by mouth at bedtime.   VITAMIN B 12 PO Take by mouth daily.      Follow-up Information    Lucas Mallow, MD Follow up.   Specialty:  Urology Why:  Follow up appointment on December 16th at [facility], [place] information: [place]  [id number] [phone]           Signed: Marton Redwood, III 09/21/2018, 6:38 PM

## 2018-09-23 DIAGNOSIS — Z7984 Long term (current) use of oral hypoglycemic drugs: Secondary | ICD-10-CM | POA: Diagnosis not present

## 2018-09-23 DIAGNOSIS — R339 Retention of urine, unspecified: Secondary | ICD-10-CM | POA: Diagnosis not present

## 2018-09-23 DIAGNOSIS — M5136 Other intervertebral disc degeneration, lumbar region: Secondary | ICD-10-CM | POA: Diagnosis not present

## 2018-09-23 DIAGNOSIS — N390 Urinary tract infection, site not specified: Secondary | ICD-10-CM | POA: Diagnosis not present

## 2018-09-23 DIAGNOSIS — E119 Type 2 diabetes mellitus without complications: Secondary | ICD-10-CM | POA: Diagnosis not present

## 2018-09-23 DIAGNOSIS — N401 Enlarged prostate with lower urinary tract symptoms: Secondary | ICD-10-CM | POA: Diagnosis not present

## 2018-09-29 DIAGNOSIS — N390 Urinary tract infection, site not specified: Secondary | ICD-10-CM | POA: Diagnosis not present

## 2018-09-29 DIAGNOSIS — Z7984 Long term (current) use of oral hypoglycemic drugs: Secondary | ICD-10-CM | POA: Diagnosis not present

## 2018-09-29 DIAGNOSIS — R339 Retention of urine, unspecified: Secondary | ICD-10-CM | POA: Diagnosis not present

## 2018-09-29 DIAGNOSIS — N401 Enlarged prostate with lower urinary tract symptoms: Secondary | ICD-10-CM | POA: Diagnosis not present

## 2018-09-29 DIAGNOSIS — M5136 Other intervertebral disc degeneration, lumbar region: Secondary | ICD-10-CM | POA: Diagnosis not present

## 2018-09-29 DIAGNOSIS — E119 Type 2 diabetes mellitus without complications: Secondary | ICD-10-CM | POA: Diagnosis not present

## 2018-10-01 DIAGNOSIS — M5136 Other intervertebral disc degeneration, lumbar region: Secondary | ICD-10-CM | POA: Diagnosis not present

## 2018-10-01 DIAGNOSIS — R339 Retention of urine, unspecified: Secondary | ICD-10-CM | POA: Diagnosis not present

## 2018-10-01 DIAGNOSIS — N401 Enlarged prostate with lower urinary tract symptoms: Secondary | ICD-10-CM | POA: Diagnosis not present

## 2018-10-01 DIAGNOSIS — N390 Urinary tract infection, site not specified: Secondary | ICD-10-CM | POA: Diagnosis not present

## 2018-10-01 DIAGNOSIS — E119 Type 2 diabetes mellitus without complications: Secondary | ICD-10-CM | POA: Diagnosis not present

## 2018-10-01 DIAGNOSIS — Z7984 Long term (current) use of oral hypoglycemic drugs: Secondary | ICD-10-CM | POA: Diagnosis not present

## 2018-10-04 DIAGNOSIS — Z7984 Long term (current) use of oral hypoglycemic drugs: Secondary | ICD-10-CM | POA: Diagnosis not present

## 2018-10-04 DIAGNOSIS — N312 Flaccid neuropathic bladder, not elsewhere classified: Secondary | ICD-10-CM | POA: Diagnosis not present

## 2018-10-04 DIAGNOSIS — N401 Enlarged prostate with lower urinary tract symptoms: Secondary | ICD-10-CM | POA: Diagnosis not present

## 2018-10-04 DIAGNOSIS — R338 Other retention of urine: Secondary | ICD-10-CM | POA: Diagnosis not present

## 2018-10-04 DIAGNOSIS — N139 Obstructive and reflux uropathy, unspecified: Secondary | ICD-10-CM | POA: Diagnosis not present

## 2018-10-04 DIAGNOSIS — M5136 Other intervertebral disc degeneration, lumbar region: Secondary | ICD-10-CM | POA: Diagnosis not present

## 2018-10-04 DIAGNOSIS — E119 Type 2 diabetes mellitus without complications: Secondary | ICD-10-CM | POA: Diagnosis not present

## 2018-10-04 DIAGNOSIS — N390 Urinary tract infection, site not specified: Secondary | ICD-10-CM | POA: Diagnosis not present

## 2018-10-04 DIAGNOSIS — R339 Retention of urine, unspecified: Secondary | ICD-10-CM | POA: Diagnosis not present

## 2018-10-21 DIAGNOSIS — Z7984 Long term (current) use of oral hypoglycemic drugs: Secondary | ICD-10-CM | POA: Diagnosis not present

## 2018-10-21 DIAGNOSIS — N390 Urinary tract infection, site not specified: Secondary | ICD-10-CM | POA: Diagnosis not present

## 2018-10-21 DIAGNOSIS — E119 Type 2 diabetes mellitus without complications: Secondary | ICD-10-CM | POA: Diagnosis not present

## 2018-10-21 DIAGNOSIS — M5136 Other intervertebral disc degeneration, lumbar region: Secondary | ICD-10-CM | POA: Diagnosis not present

## 2018-10-21 DIAGNOSIS — R339 Retention of urine, unspecified: Secondary | ICD-10-CM | POA: Diagnosis not present

## 2018-10-21 DIAGNOSIS — N401 Enlarged prostate with lower urinary tract symptoms: Secondary | ICD-10-CM | POA: Diagnosis not present

## 2018-11-01 DIAGNOSIS — Z7984 Long term (current) use of oral hypoglycemic drugs: Secondary | ICD-10-CM | POA: Diagnosis not present

## 2018-11-01 DIAGNOSIS — E119 Type 2 diabetes mellitus without complications: Secondary | ICD-10-CM | POA: Diagnosis not present

## 2018-11-01 DIAGNOSIS — R339 Retention of urine, unspecified: Secondary | ICD-10-CM | POA: Diagnosis not present

## 2018-11-01 DIAGNOSIS — N401 Enlarged prostate with lower urinary tract symptoms: Secondary | ICD-10-CM | POA: Diagnosis not present

## 2018-11-01 DIAGNOSIS — M5136 Other intervertebral disc degeneration, lumbar region: Secondary | ICD-10-CM | POA: Diagnosis not present

## 2018-11-01 DIAGNOSIS — N390 Urinary tract infection, site not specified: Secondary | ICD-10-CM | POA: Diagnosis not present

## 2019-01-17 ENCOUNTER — Telehealth: Payer: Self-pay | Admitting: Cardiology

## 2019-01-17 NOTE — Telephone Encounter (Signed)
New message:   Patient would like to speak with a nurse concerning some medication.

## 2019-01-18 NOTE — Telephone Encounter (Signed)
F/U Message          Patient's son is calling today to talk with Dr. Theodosia Blender nurse to get cardiac clearance for his dad to increase his "Methylphenidate" Per Dr. Eulogio Bear" this is the patient's  Psychiatrist at the [facility]. Pls call to advise. This is the patient second call and no return call.

## 2019-01-18 NOTE — Telephone Encounter (Signed)
This is Dr. Julianne Rice patient

## 2019-01-20 ENCOUNTER — Telehealth: Payer: Self-pay

## 2019-01-20 NOTE — Telephone Encounter (Signed)
Patient self referred himself to Dr. Radford Pax and has appt scheduled for 03/2019. Note to Dr.RRR sent for referral approval. Currentl issue is pt was on ritalin for 40 + years 80 mg daily and taken off, switched to Buproprion for 3 mo. Patient has not taking Buproprion for 3 weeks. Currently on 5 mg ritalin 2x daily. Patient now wants cardiac clearance to increase daily dose of ritalin. RN confirmed consent for telemedicine visit on 01/26/19. Son Gwyndolyn Saxon instructed to have BP cuff, patients current weight, all medication bottles and paper/pen available. No further questions at this time.

## 2019-01-20 NOTE — Telephone Encounter (Signed)
Follow up    Patient is requesting a call in reference to a clearance. This is a patient of Dr. Julianne Rice. From a clinic standpoint nothing was done to okay the switch from [place] to [place]. Please contact patients son to discuss.

## 2019-01-21 ENCOUNTER — Telehealth (INDEPENDENT_AMBULATORY_CARE_PROVIDER_SITE_OTHER): Payer: Medicare Other | Admitting: Cardiology

## 2019-01-21 ENCOUNTER — Encounter: Payer: Self-pay | Admitting: Cardiology

## 2019-01-21 ENCOUNTER — Telehealth: Payer: Self-pay | Admitting: Cardiology

## 2019-01-21 ENCOUNTER — Other Ambulatory Visit: Payer: Self-pay

## 2019-01-21 VITALS — BP 115/49 | HR 67 | Ht 67.0 in | Wt 151.0 lb

## 2019-01-21 DIAGNOSIS — Z953 Presence of xenogenic heart valve: Secondary | ICD-10-CM

## 2019-01-21 DIAGNOSIS — Z09 Encounter for follow-up examination after completed treatment for conditions other than malignant neoplasm: Secondary | ICD-10-CM

## 2019-01-21 DIAGNOSIS — Z952 Presence of prosthetic heart valve: Secondary | ICD-10-CM

## 2019-01-21 NOTE — Progress Notes (Signed)
Virtual Visit via Video Note   This visit type was conducted due to national recommendations for restrictions regarding the COVID-19 Pandemic (e.g. social distancing) in an effort to limit this patient's exposure and mitigate transmission in our community.  Due to his co-morbid illnesses, this patient is at least at moderate risk for complications without adequate follow up.  This format is felt to be most appropriate for this patient at this time.  All issues noted in this document were discussed and addressed.  A limited physical exam was performed with this format.  Please refer to the patient's chart for his consent to telehealth for [facility].   Evaluation Performed:  Follow-up visit  Date:  01/21/2019   ID:  Angel Barton, DOB 1926-09-15, MRN [id number]  Patient Location: Home  Provider Location: Home  PCP:  Lyman Bishop, DO  Cardiologist:  No primary care provider on file.  Electrophysiologist:  None   Chief Complaint:  S/p TAVR  History of Present Illness:    Angel Barton is a 85 y.o. male who presents via audio/video conferencing for a telehealth visit today.    Patient has history of TAVR surgery.  He denies any problems at this time and ambulates age appropriately within his house.  His family is very supportive.  No chest pain orthopnea or PND.  He takes care of activities of daily living as appropriate.  At the time of my evaluation, the patient is alert awake oriented and in no distress.  He had TURP surgery and this was uneventful.  The patient does not have symptoms concerning for COVID-19 infection (fever, chills, cough, or new shortness of breath).    Past Medical History:  Diagnosis Date  . ADD (attention deficit disorder)   . Anxiety   . Arthritis    left leg  . BPH (benign prostatic hyperplasia)   . CKD (chronic kidney disease), stage V ([place]) 08/14/2018   pt unaware  . Depression   . Diabetes mellitus without complication ([place])   . Does use  hearing aid   . GERD (gastroesophageal reflux disease)   . Grade I diastolic dysfunction 40/97/3532   Noted on ECHO  . Hyperlipidemia   . Hypertension   . Kidney stones   . LAE (left atrial enlargement) 02/15/2018   Moderate, Noted on ECHO  . Left bundle branch block 08/13/2018   Noted on EKG   . LVH (left ventricular hypertrophy) 02/15/2018   Mild, Noted on ECHO  . Nephrolithiasis 08/14/2018  . Nocturia   . Pedal edema 02/2018  . Urinary retention 08/14/2018   Past Surgical History:  Procedure Laterality Date  . AORTIC VALVE REPLACEMENT  06/26/2017  . JOINT REPLACEMENT    . right hip replacement  1994  . TRANSURETHRAL RESECTION OF PROSTATE N/A 09/20/2018   Procedure: TRANSURETHRAL RESECTION OF THE PROSTATE (TURP);  Surgeon: Lucas Mallow, MD;  Location: [facility];  Service: Urology;  Laterality: N/A;     Current Meds  Medication Sig  . ALPRAZolam (XANAX) 0.25 MG tablet Take 0.25 mg by mouth 2 (two) times daily as needed for anxiety.   Marland Kitchen aspirin EC 81 MG tablet Take 81 mg by mouth daily.  Marland Kitchen atenolol (TENORMIN) 50 MG tablet Take 50 mg by mouth daily.  . Cyanocobalamin (VITAMIN B 12 PO) Take by mouth daily.  . metFORMIN (GLUCOPHAGE-XR) 500 MG 24 hr tablet Take 500 mg by mouth daily with breakfast.  . methylphenidate (RITALIN) 20 MG tablet Take 20 mg  by mouth daily.   Marland Kitchen PARoxetine (PAXIL) 40 MG tablet Take 40 mg by mouth every morning.  . simvastatin (ZOCOR) 20 MG tablet Take 20 mg by mouth every morning.   . terazosin (HYTRIN) 10 MG capsule Take 10 mg by mouth at bedtime.     Allergies:   Patient has no known allergies.   Social History   Tobacco Use  . Smoking status: Former Research scientist (life sciences)  . Smokeless tobacco: Never Used  Substance Use Topics  . Alcohol use: Never    Frequency: Never  . Drug use: Never     Family Hx: The patient's family history includes Prostate cancer in his father.  ROS:   Please see the history of present illness.    Systemic examination was  negative with the patient having no symptoms All other systems reviewed and are negative.   Prior CV studies:   The following studies were reviewed today:  Echocardiogram and stress test reports were discussed with the patient  Labs/Other Tests and Data Reviewed:    EKG:  No ECG reviewed.  Recent Labs: 08/13/2018: ALT 12 09/15/2018: BUN 23; Creatinine, Ser 1.05; Platelets 226; Potassium 4.6; Sodium 139 09/21/2018: Hemoglobin 9.3   Recent Lipid Panel No results found for: CHOL, TRIG, HDL, CHOLHDL, LDLCALC, LDLDIRECT  Wt Readings from Last 3 Encounters:  01/21/19 151 lb (68.5 kg)  09/20/18 162 lb (73.5 kg)  09/16/18 157 lb (71.2 kg)     Objective:    Vital Signs:  BP (!) 115/49 (BP Location: Left Arm, Patient Position: Sitting, Cuff Size: Normal)   Pulse 67   Ht 5\' 7"  (1.702 m)   Wt 151 lb (68.5 kg)   BMI 23.65 kg/m    Well nourished, well developed male in no acute distress. Patient is alert awake oriented and comfortable.  ASSESSMENT & PLAN:    1. Primary prevention stressed with the patient.  Importance of compliance with diet and medication stressed and he vocalized understanding. 2. His blood pressure is stable.  He is stable at low level from a cardiac standpoint.  He is 85 years old and ambulates age appropriately. 3. His echocardiogram revealed valve function was satisfactory.  His stress test was unremarkable. 4. Patient will be seen in follow-up appointment in 6 months or earlier if the patient has any concerns  COVID-19 Education: The signs and symptoms of COVID-19 were discussed with the patient and how to seek care for testing (follow up with PCP or arrange E-visit).  The importance of social distancing was discussed today.  Time:   Today, I have spent 17 minutes with the patient with telehealth technology discussing the above problems.     Medication Adjustments/Labs and Tests Ordered: Current medicines are reviewed at length with the patient today.   Concerns regarding medicines are outlined above.  Tests Ordered: No orders of the defined types were placed in this encounter.  Medication Changes: No orders of the defined types were placed in this encounter.   Disposition:  Follow up in 3 month(s)  Signed, Jenean Lindau, MD  01/21/2019 10:52 AM    [place]

## 2019-01-21 NOTE — Telephone Encounter (Signed)
Per RRR send lexiscan results to Dr. Eulogio Bear at [facility], faxed results to [phone]

## 2019-01-21 NOTE — Telephone Encounter (Signed)
The patient has requested a provider switch from Dr. Geraldo Pitter to Dr. Radford Pax.

## 2019-01-21 NOTE — Telephone Encounter (Signed)
I am fine

## 2019-01-21 NOTE — Patient Instructions (Signed)
Medication Instructions:  Your physician recommends that you continue on your current medications as directed. Please refer to the Current Medication list given to you today.  If you need a refill on your cardiac medications before your next appointment, please call your pharmacy.   Lab work: NONE If you have labs (blood work) drawn today and your tests are completely normal, you will receive your results only by: Marland Kitchen MyChart Message (if you have MyChart) OR . A paper copy in the mail If you have any lab test that is abnormal or we need to change your treatment, we will call you to review the results.  Testing/Procedures: NONE  Follow-Up: As needed.  Any Other Special Instructions Will Be Listed Below   Patient self referred to Dr. Radford Pax with 03/2019 appointment. Dr.Revankar is aware and patient advised to call office if he has further needs.

## 2019-01-24 NOTE — Telephone Encounter (Signed)
After reading notes with complicated hx would prefer he stay with Dr. Geraldo Barton especially since it will be a video visit and he will not have to travel

## 2019-01-26 NOTE — Telephone Encounter (Signed)
That Is fine

## 2019-03-14 DIAGNOSIS — H2513 Age-related nuclear cataract, bilateral: Secondary | ICD-10-CM | POA: Diagnosis not present

## 2019-03-14 DIAGNOSIS — H25042 Posterior subcapsular polar age-related cataract, left eye: Secondary | ICD-10-CM | POA: Diagnosis not present

## 2019-03-18 ENCOUNTER — Ambulatory Visit: Payer: Medicare Other | Admitting: Cardiology

## 2019-03-21 NOTE — Progress Notes (Signed)
Virtual Visit via Video Note   This visit type was conducted due to national recommendations for restrictions regarding the COVID-19 Pandemic (e.g. social distancing) in an effort to limit this patient's exposure and mitigate transmission in our community.  Due to his co-morbid illnesses, this patient is at least at moderate risk for complications without adequate follow up.  This format is felt to be most appropriate for this patient at this time.  The patient did not have access to video technology/had technical difficulties with video requiring transitioning to audio format only (telephone).  All issues noted in this document were discussed and addressed.  No physical exam could be performed with this format.  Please refer to the patient's chart for his  consent to telehealth for [facility].  Evaluation Performed:  Follow-up visit  This visit type was conducted due to national recommendations for restrictions regarding the COVID-19 Pandemic (e.g. social distancing).  This format is felt to be most appropriate for this patient at this time.  All issues noted in this document were discussed and addressed.  No physical exam was performed (except for noted visual exam findings with Video Visits).  Please refer to the patient's chart (MyChart message for video visits and phone note for telephone visits) for the patient's consent to telehealth for [facility].  Date:  03/22/2019   ID:  Angel Barton, DOB 05/08/1926, MRN [id number]  Patient Location:  home  Provider location:   [place]  PCP:  Lyman Bishop, DO  Cardiologist:  Fransico Him, MD Electrophysiologist:  None   Chief Complaint:  Severe AS s/p TAVR  History of Present Illness:    Angel Barton is a 85 y.o. male who presents via audio/video conferencing for a telehealth visit today.    This is a 85yo male with a history of severe AS, CKD stage 5, DM, GERD, HTN, hyperlipidemia and  LBBB.  He is s/p TAVR in 2018 and has  been followed by Dr. Julianne Rice.  Due to distance to his office the patient is now referred to establish care with me.    He is here today for followup and is doing well.  He denies any chest pain or pressure, SOB, DOE, PND, orthopnea, LE edema, dizziness, palpitations or syncope. He is compliant with his meds and is tolerating meds with no SE.    The patient does not have symptoms concerning for COVID-19 infection (fever, chills, cough, or new shortness of breath).    Prior CV studies:   The following studies were reviewed today:  2D echo  Past Medical History:  Diagnosis Date  . ADD (attention deficit disorder)   . Anxiety   . Arthritis    left leg  . BPH (benign prostatic hyperplasia)   . CKD (chronic kidney disease), stage V ([place]) 08/14/2018   pt unaware  . Depression   . Diabetes mellitus without complication ([place])   . Does use hearing aid   . GERD (gastroesophageal reflux disease)   . Grade I diastolic dysfunction 62/95/2841   Noted on ECHO  . Hyperlipidemia   . Hypertension   . Kidney stones   . LAE (left atrial enlargement) 02/15/2018   Moderate, Noted on ECHO  . Left bundle branch block 08/13/2018   Noted on EKG   . LVH (left ventricular hypertrophy) 02/15/2018   Mild, Noted on ECHO  . Nephrolithiasis 08/14/2018  . Nocturia   . Pedal edema 02/2018  . Urinary retention 08/14/2018   Past Surgical History:  Procedure Laterality Date  . AORTIC VALVE REPLACEMENT  06/26/2017  . JOINT REPLACEMENT    . right hip replacement  1994  . TRANSURETHRAL RESECTION OF PROSTATE N/A 09/20/2018   Procedure: TRANSURETHRAL RESECTION OF THE PROSTATE (TURP);  Surgeon: Lucas Mallow, MD;  Location: [facility];  Service: Urology;  Laterality: N/A;     Current Meds  Medication Sig  . atenolol (TENORMIN) 50 MG tablet Take 50 mg by mouth daily.  . Cyanocobalamin (VITAMIN B 12 PO) Take by mouth daily.  . metFORMIN (GLUCOPHAGE-XR) 500 MG 24 hr tablet Take 500 mg by mouth daily with  breakfast.  . methylphenidate (RITALIN) 20 MG tablet Take 20 mg by mouth daily.   Marland Kitchen PARoxetine (PAXIL) 40 MG tablet Take 40 mg by mouth every morning.  . simvastatin (ZOCOR) 20 MG tablet Take 20 mg by mouth every morning.   . terazosin (HYTRIN) 10 MG capsule Take 10 mg by mouth at bedtime.     Allergies:   Patient has no known allergies.   Social History   Tobacco Use  . Smoking status: Former Research scientist (life sciences)  . Smokeless tobacco: Never Used  Substance Use Topics  . Alcohol use: Never    Frequency: Never  . Drug use: Never     Family Hx: The patient's family history includes Prostate cancer in his father.  ROS:   Please see the history of present illness.     All other systems reviewed and are negative.   Labs/Other Tests and Data Reviewed:    Recent Labs: 08/13/2018: ALT 12 09/15/2018: BUN 23; Creatinine, Ser 1.05; Platelets 226; Potassium 4.6; Sodium 139 09/21/2018: Hemoglobin 9.3   Recent Lipid Panel No results found for: CHOL, TRIG, HDL, CHOLHDL, LDLCALC, LDLDIRECT  Wt Readings from Last 3 Encounters:  03/22/19 149 lb (67.6 kg)  01/21/19 151 lb (68.5 kg)  09/20/18 162 lb (73.5 kg)     Objective:    Vital Signs:  BP (!) 148/72   Pulse (!) 57   Ht 5\' 7"  (1.702 m)   Wt 149 lb (67.6 kg)   BMI 23.34 kg/m    CONSTITUTIONAL:  Well nourished, well developed male in no acute distress.  EYES: anicteric MOUTH: oral mucosa is pink RESPIRATORY: Normal respiratory effort, symmetric expansion CARDIOVASCULAR: No peripheral edema SKIN: No rash, lesions or ulcers MUSCULOSKELETAL: no digital cyanosis NEURO: Cranial Nerves II-XII grossly intact, moves all extremities PSYCH: Intact judgement and insight.  A&O x 3, Mood/affect appropriate   ASSESSMENT & PLAN:    1.  Severe aortic stenosis - he is s/p TAVR in 2018.  His last 2D echo a year ago showed stable TAVR with normal function and mean AVG of 56mmHg.  He is doing well and will continue on ASA 81mg  daily    2.  Hypertension  - his BP is controlled.  He will continue on Atenolol 50mg  daily.  3.  LBBB - nuclear stress test 09/2018 showed inferoapical scar and no peri infarct ischemia.  He has not had any anginal sx.   4.  Type 2 DM  - this is followed by his PCP. He will remain on metformin XR 500mg  daily.    COVID-19 Education: The signs and symptoms of COVID-19 were discussed with the patient and how to seek care for testing (follow up with PCP or arrange E-visit).  The importance of social distancing was discussed today.  Patient Risk:   After full review of this patient's clinical status, I feel that they are  at least moderate risk at this time.  Time:   Today, I have spent 20 minutes in telemedicine discussing medical problems including AS, HTN, LBBB.  We also reviewed the symptoms of COVID 19 and the ways to protect against contracting the virus with telehealth technology.  I spent an additional 5 minutes reviewing patient's chart including 2D echo and office notes.  Medication Adjustments/Labs and Tests Ordered: Current medicines are reviewed at length with the patient today.  Concerns regarding medicines are outlined above.  Tests Ordered: No orders of the defined types were placed in this encounter.  Medication Changes: No orders of the defined types were placed in this encounter.   Disposition:  Follow up 6 months  Signed, Fransico Him, MD  03/22/2019 9:05 AM    [place]

## 2019-03-22 ENCOUNTER — Other Ambulatory Visit: Payer: Self-pay

## 2019-03-22 ENCOUNTER — Telehealth (INDEPENDENT_AMBULATORY_CARE_PROVIDER_SITE_OTHER): Payer: Medicare Other | Admitting: Cardiology

## 2019-03-22 ENCOUNTER — Encounter: Payer: Self-pay | Admitting: Cardiology

## 2019-03-22 DIAGNOSIS — Z953 Presence of xenogenic heart valve: Secondary | ICD-10-CM | POA: Diagnosis not present

## 2019-03-22 DIAGNOSIS — I447 Left bundle-branch block, unspecified: Secondary | ICD-10-CM

## 2019-03-22 DIAGNOSIS — I10 Essential (primary) hypertension: Secondary | ICD-10-CM

## 2019-03-22 NOTE — Patient Instructions (Addendum)
Medication Instructions:   If you need a refill on your cardiac medications before your next appointment, please call your pharmacy.   Lab work:  If you have labs (blood work) drawn today and your tests are completely normal, you will receive your results only by: Marland Kitchen MyChart Message (if you have MyChart) OR . A paper copy in the mail If you have any lab test that is abnormal or we need to change your treatment, we will call you to review the results.  Testing/Procedures: None ordered today.  Follow-Up: At [facility], you and your health needs are our priority.  As part of our continuing mission to provide you with exceptional heart care, we have created designated Provider Care Teams.  These Care Teams include your primary Cardiologist (physician) and Advanced Practice Providers (APPs -  Physician Assistants and Nurse Practitioners) who all work together to provide you with the care you need, when you need it. You will need a follow up appointment in 6 months.  Please call our office 2 months in advance to schedule this appointment.  You may see Dr. Radford Pax or one of the following Advanced Practice Providers on your designated Care Team:   [place], PA-C Melina Copa, PA-C . Ermalinda Barrios, PA-C

## 2019-03-31 DIAGNOSIS — L84 Corns and callosities: Secondary | ICD-10-CM | POA: Diagnosis not present

## 2019-03-31 DIAGNOSIS — L089 Local infection of the skin and subcutaneous tissue, unspecified: Secondary | ICD-10-CM | POA: Diagnosis not present

## 2019-04-01 ENCOUNTER — Ambulatory Visit (INDEPENDENT_AMBULATORY_CARE_PROVIDER_SITE_OTHER): Payer: Medicare Other

## 2019-04-01 ENCOUNTER — Encounter: Payer: Self-pay | Admitting: Podiatry

## 2019-04-01 ENCOUNTER — Other Ambulatory Visit: Payer: Self-pay

## 2019-04-01 ENCOUNTER — Ambulatory Visit (INDEPENDENT_AMBULATORY_CARE_PROVIDER_SITE_OTHER): Payer: Medicare Other | Admitting: Podiatry

## 2019-04-01 VITALS — BP 133/62 | HR 60 | Temp 97.8°F | Resp 16

## 2019-04-01 DIAGNOSIS — L97511 Non-pressure chronic ulcer of other part of right foot limited to breakdown of skin: Secondary | ICD-10-CM | POA: Diagnosis not present

## 2019-04-01 DIAGNOSIS — E1142 Type 2 diabetes mellitus with diabetic polyneuropathy: Secondary | ICD-10-CM | POA: Diagnosis not present

## 2019-04-01 DIAGNOSIS — E1169 Type 2 diabetes mellitus with other specified complication: Secondary | ICD-10-CM | POA: Diagnosis not present

## 2019-04-01 DIAGNOSIS — B351 Tinea unguium: Secondary | ICD-10-CM | POA: Diagnosis not present

## 2019-04-01 NOTE — Patient Instructions (Signed)

## 2019-04-11 NOTE — Progress Notes (Signed)
Subjective:  Patient ID: Angel Barton, male    DOB: 10/24/1925,  MRN: [id number]  Chief Complaint  Patient presents with  . Foot Pain    Sub 5th MPJ right - large, tender, callused area x 1 month, painful to walk, requesting nail care too  . New Patient (Initial Visit)    85 y.o. male presents for wound care. Hx as above.  Review of Systems: Negative except as noted in the HPI. Denies N/V/F/Ch.  Past Medical History:  Diagnosis Date  . ADD (attention deficit disorder)   . Anxiety   . Arthritis    left leg  . BPH (benign prostatic hyperplasia)   . CKD (chronic kidney disease), stage V ([place]) 08/14/2018   pt unaware  . Depression   . Diabetes mellitus without complication ([place])   . Does use hearing aid   . GERD (gastroesophageal reflux disease)   . Grade I diastolic dysfunction 78/24/2353   Noted on ECHO  . Hyperlipidemia   . Hypertension   . Kidney stones   . LAE (left atrial enlargement) 02/15/2018   Moderate, Noted on ECHO  . Left bundle branch block 08/13/2018   Noted on EKG   . LVH (left ventricular hypertrophy) 02/15/2018   Mild, Noted on ECHO  . Nephrolithiasis 08/14/2018  . Nocturia   . Pedal edema 02/2018  . Urinary retention 08/14/2018    Current Outpatient Medications:  .  atenolol (TENORMIN) 50 MG tablet, Take 50 mg by mouth daily., Disp: , Rfl:  .  Cyanocobalamin (VITAMIN B 12 PO), Take by mouth daily., Disp: , Rfl:  .  metFORMIN (GLUCOPHAGE-XR) 500 MG 24 hr tablet, Take 500 mg by mouth daily with breakfast., Disp: , Rfl:  .  methylphenidate (RITALIN) 20 MG tablet, Take 20 mg by mouth daily. , Disp: , Rfl:  .  PARoxetine (PAXIL) 40 MG tablet, Take 40 mg by mouth every morning., Disp: , Rfl:  .  simvastatin (ZOCOR) 20 MG tablet, Take 20 mg by mouth every morning. , Disp: , Rfl:  .  terazosin (HYTRIN) 10 MG capsule, Take 10 mg by mouth at bedtime., Disp: , Rfl:   Social History   Tobacco Use  Smoking Status Former Smoker  Smokeless Tobacco Never Used     No Known Allergies Objective:   Vitals:   04/01/19 1653  BP: 133/62  Pulse: 60  Resp: 16  Temp: 97.8 F (36.6 C)   There is no height or weight on file to calculate BMI. Constitutional Well developed. Well nourished.  Vascular Dorsalis pedis pulses palpable bilaterally. Posterior tibial pulses palpable bilaterally. Capillary refill normal to all digits.  No cyanosis or clubbing noted. Pedal hair growth normal.  Neurologic Normal speech. Oriented to person, place, and time. Protective sensation absent  Dermatologic Wound Location: R submet 5 Wound Base: Granular/Healthy Peri-wound: Calloused Exudate: Scant/small amount Serous exudate Wound Measurements: -0.2x0.2 post-debridement.  Nails x10 elongated and thickened with dystrophic changes.  Orthopedic: No pain to palpation either foot.   Radiographs: Taken and reviewed no acute fractures or dislocations no definite erosions. Assessment:   1. Skin ulcer of right foot, limited to breakdown of skin ([place])   2. Onychomycosis of multiple toenails with type 2 diabetes mellitus and peripheral neuropathy ([place])    Plan:  Patient was evaluated and treated and all questions answered.  Ulcer Right 5th MPJ -Debridement as below. -Dressed with abx ointment, DSD. -Offload in surgical shoe. Dispensed today.  Procedure: Excisional Debridement of Wound Rationale: Removal of non-viable  soft tissue from the wound to promote healing.  Anesthesia: none Pre-Debridement Wound Measurements: overlying hyperkeratosis  Post-Debridement Wound Measurements: 0.2 cm x 0.2 cm x 0.1 cm  Type of Debridement: Sharp Excisional Tissue Removed: Non-viable soft tissue Depth of Debridement: subcutaneous tissue. Technique: Sharp excisional debridement to bleeding, viable wound base.  Dressing: Dry, sterile, compression dressing. Disposition: Patient tolerated procedure well. Patient to return in 1 week for follow-up.  DM with Onychomycosis and DPN  -Nails debrided x10  No follow-ups on file.

## 2019-04-18 IMAGING — US US EXTREM LOW VENOUS BILAT
1 series · 13 of 24 positions shown · non-contrast
Comparison: None.

CLINICAL DATA: [AGE] male with bilateral pedal edema worse on
the left than the right



[Series 1: us extrem low venous bilat · 0.09mm/px · 13 of 65 slices shown]
[im 1/65]
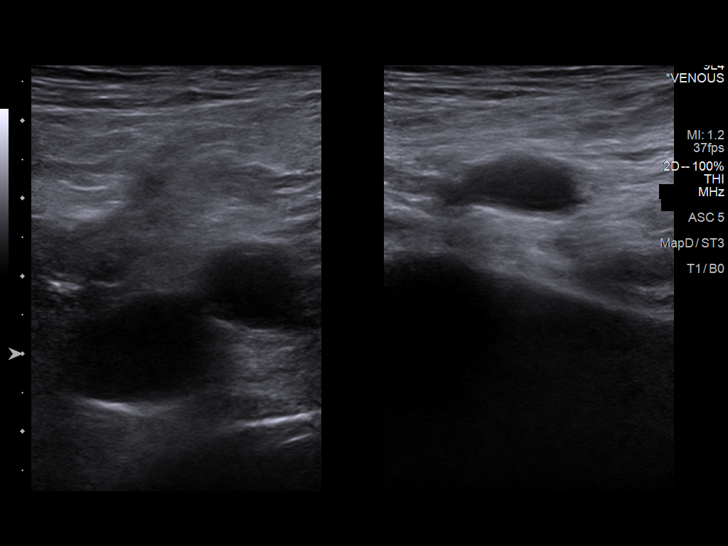
[im 6/65]
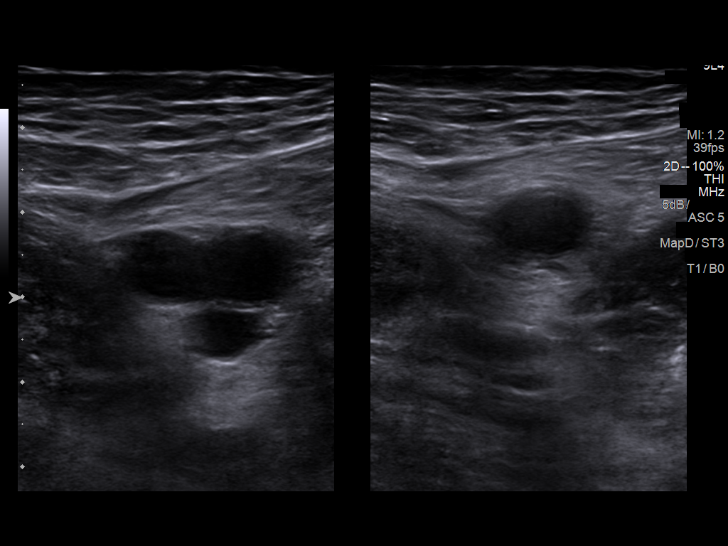
[im 12/65]
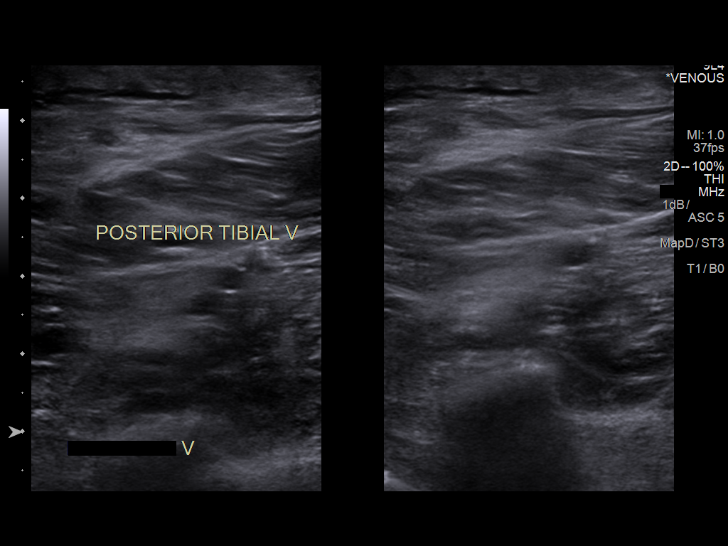
[im 17/65]
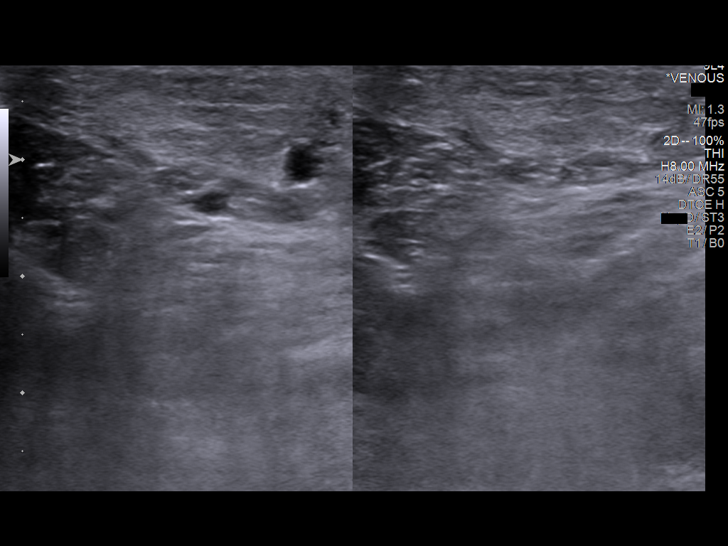
[im 23/65]
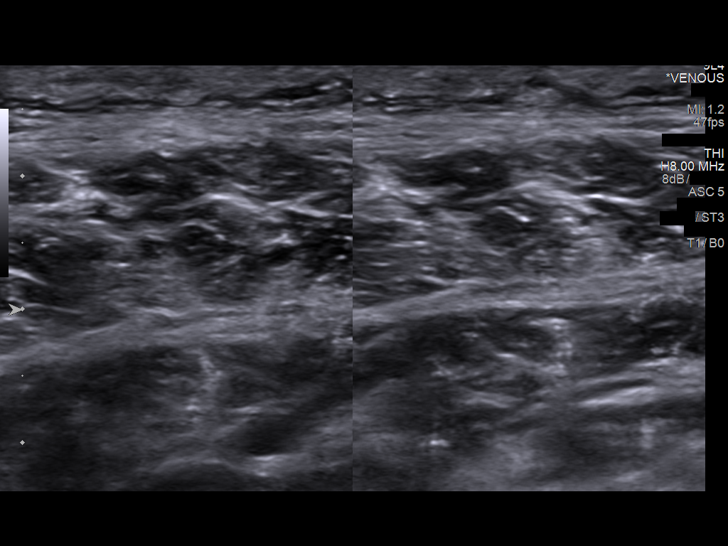
[im 28/65]
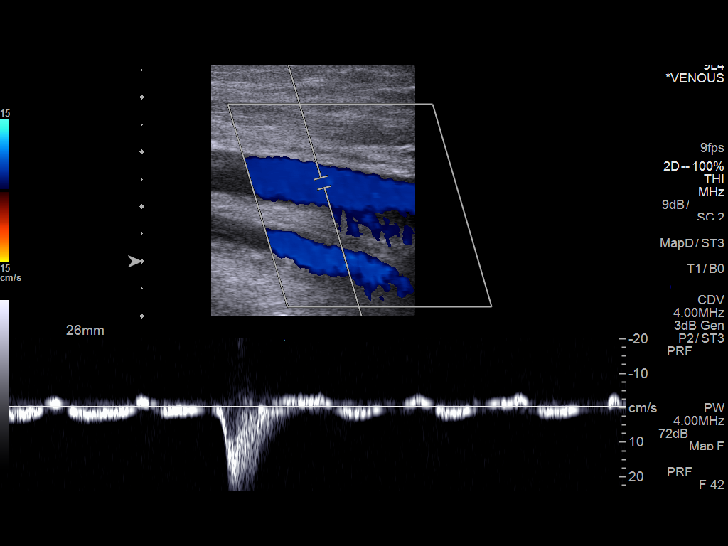
[im 34/65]
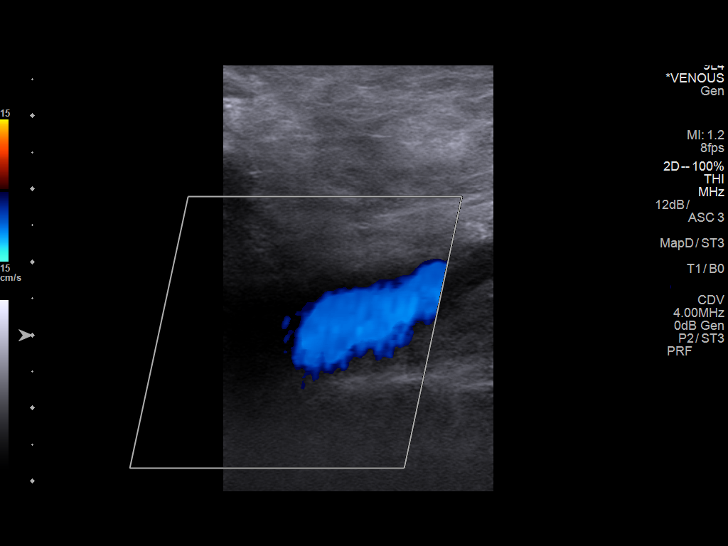
[im 37/65]
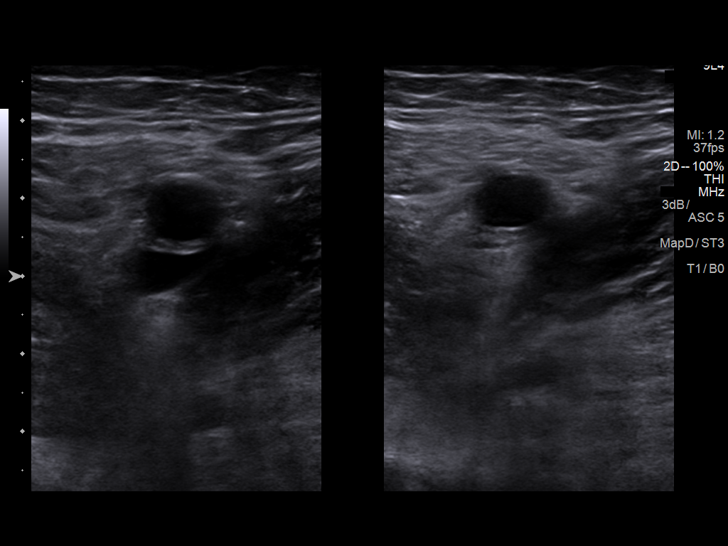
[im 42/65]
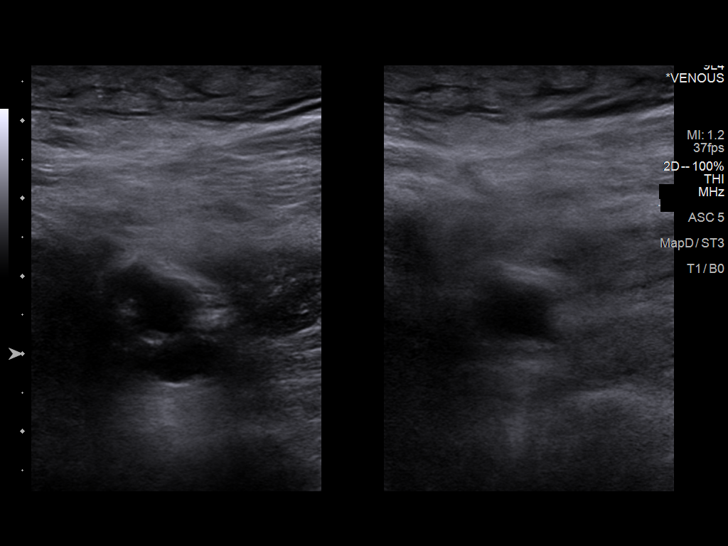
[im 48/65]
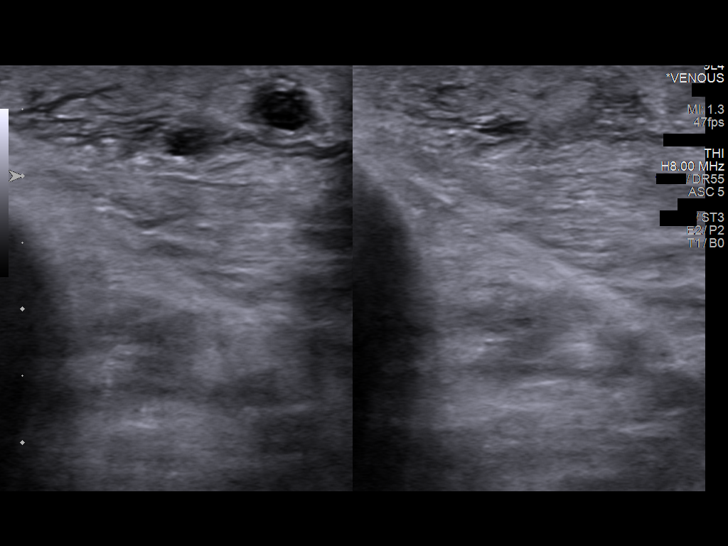
[im 53/65]
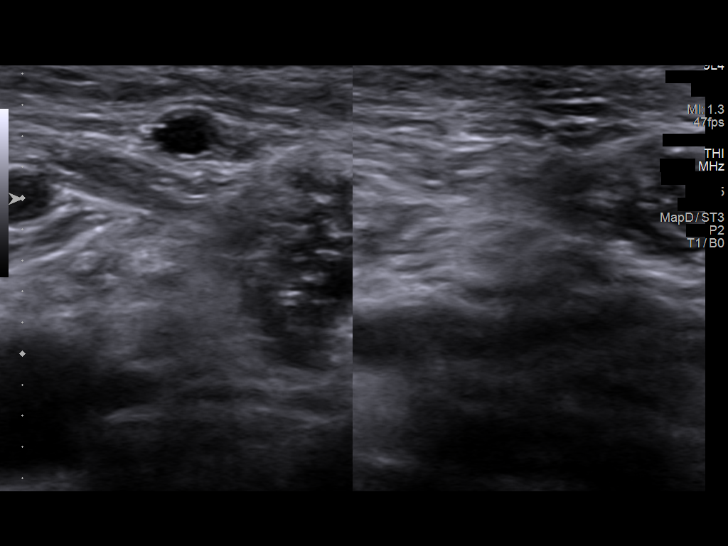
[im 59/65]
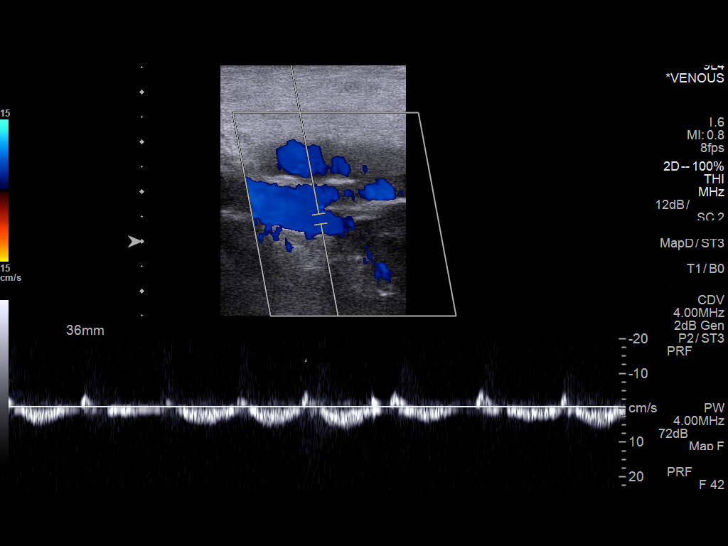
[im 65/65]
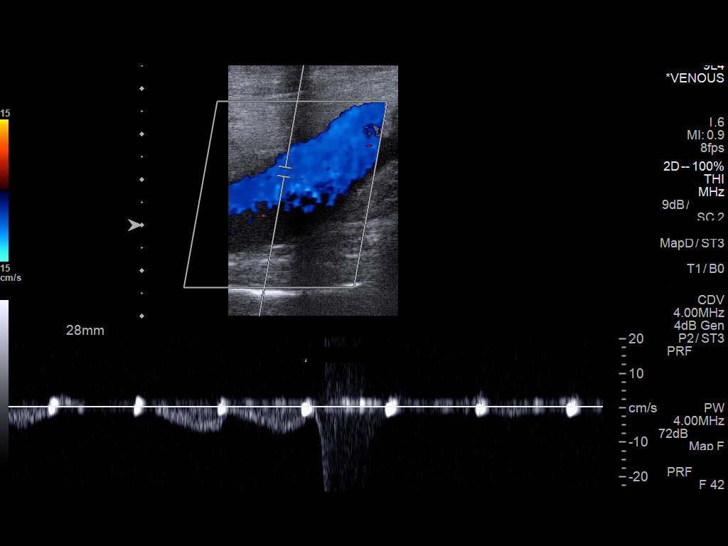

[13 of 24 positions shown; findings below may reference images not displayed]

FINDINGS: RIGHT LOWER EXTREMITY

Common Femoral Vein: No evidence of thrombus. Normal
compressibility, respiratory phasicity and response to augmentation.

Saphenofemoral Junction: No evidence of thrombus. Normal
compressibility and flow on color Doppler imaging.

Profunda Femoral Vein: No evidence of thrombus. Normal
compressibility and flow on color Doppler imaging.

Femoral Vein: No evidence of thrombus. Normal compressibility,
respiratory phasicity and response to augmentation.

Popliteal Vein: No evidence of thrombus. Normal compressibility,
respiratory phasicity and response to augmentation.

Calf Veins: No evidence of thrombus. Normal compressibility and flow
on color Doppler imaging.

Superficial Great Saphenous Vein: No evidence of thrombus. Normal
compressibility.

Venous Reflux:  None.

Other Findings:  None.

LEFT LOWER EXTREMITY

Common Femoral Vein: No evidence of thrombus. Normal
compressibility, respiratory phasicity and response to augmentation.

Saphenofemoral Junction: No evidence of thrombus. Normal
compressibility and flow on color Doppler imaging.

Profunda Femoral Vein: No evidence of thrombus. Normal
compressibility and flow on color Doppler imaging.

Femoral Vein: No evidence of thrombus. Normal compressibility,
respiratory phasicity and response to augmentation.

Popliteal Vein: No evidence of thrombus. Normal compressibility,
respiratory phasicity and response to augmentation.

Calf Veins: No evidence of thrombus. Normal compressibility and flow
on color Doppler imaging.

Superficial Great Saphenous Vein: No evidence of thrombus. Normal
compressibility.

Venous Reflux:  None.

Other Findings:  None.
IMPRESSION: No evidence of deep venous thrombosis.

## 2019-04-22 ENCOUNTER — Telehealth: Payer: Medicare Other | Admitting: Cardiology

## 2019-04-26 DIAGNOSIS — H903 Sensorineural hearing loss, bilateral: Secondary | ICD-10-CM | POA: Diagnosis not present

## 2019-04-26 DIAGNOSIS — E119 Type 2 diabetes mellitus without complications: Secondary | ICD-10-CM | POA: Diagnosis not present

## 2019-04-28 ENCOUNTER — Ambulatory Visit (INDEPENDENT_AMBULATORY_CARE_PROVIDER_SITE_OTHER): Payer: Medicare Other | Admitting: Podiatry

## 2019-04-28 ENCOUNTER — Other Ambulatory Visit: Payer: Self-pay

## 2019-04-28 VITALS — Temp 96.9°F

## 2019-04-28 DIAGNOSIS — L97511 Non-pressure chronic ulcer of other part of right foot limited to breakdown of skin: Secondary | ICD-10-CM | POA: Diagnosis not present

## 2019-04-28 NOTE — Patient Instructions (Signed)
The wound appears to have healed. We do not need to use the betadine any more. We will check it again in a month. If the wound opens up again you must call right away for an appointment to be seen.

## 2019-05-16 NOTE — Progress Notes (Signed)
  Subjective:  Patient ID: Angel Barton, male    DOB: 06/28/1926,  MRN: [id number]  Chief Complaint  Patient presents with  . Wound Check    Pt here for right foot ulcer check. Pt states he is not having fever/nausea/vomiting/chills.    85 y.o. male presents for wound care. Hx as above.  States that he is doing the Betadine as directed  Review of Systems: Negative except as noted in the HPI. Denies N/V/F/Ch.  Past Medical History:  Diagnosis Date  . ADD (attention deficit disorder)   . Anxiety   . Arthritis    left leg  . BPH (benign prostatic hyperplasia)   . CKD (chronic kidney disease), stage V ([place]) 08/14/2018   pt unaware  . Depression   . Diabetes mellitus without complication ([place])   . Does use hearing aid   . GERD (gastroesophageal reflux disease)   . Grade I diastolic dysfunction 74/94/4967   Noted on ECHO  . Hyperlipidemia   . Hypertension   . Kidney stones   . LAE (left atrial enlargement) 02/15/2018   Moderate, Noted on ECHO  . Left bundle branch block 08/13/2018   Noted on EKG   . LVH (left ventricular hypertrophy) 02/15/2018   Mild, Noted on ECHO  . Nephrolithiasis 08/14/2018  . Nocturia   . Pedal edema 02/2018  . Urinary retention 08/14/2018    Current Outpatient Medications:  .  atenolol (TENORMIN) 50 MG tablet, Take 50 mg by mouth daily., Disp: , Rfl:  .  Cyanocobalamin (VITAMIN B 12 PO), Take by mouth daily., Disp: , Rfl:  .  metFORMIN (GLUCOPHAGE-XR) 500 MG 24 hr tablet, Take 500 mg by mouth daily with breakfast., Disp: , Rfl:  .  methylphenidate (RITALIN) 20 MG tablet, Take 20 mg by mouth daily. , Disp: , Rfl:  .  PARoxetine (PAXIL) 40 MG tablet, Take 40 mg by mouth every morning., Disp: , Rfl:  .  simvastatin (ZOCOR) 20 MG tablet, Take 20 mg by mouth every morning. , Disp: , Rfl:  .  terazosin (HYTRIN) 10 MG capsule, Take 10 mg by mouth at bedtime., Disp: , Rfl:   Social History   Tobacco Use  Smoking Status Former Smoker  Smokeless Tobacco  Never Used    No Known Allergies Objective:   Vitals:   04/28/19 1329  Temp: (!) 96.9 F (36.1 C)   There is no height or weight on file to calculate BMI. Constitutional Well developed. Well nourished.  Vascular Dorsalis pedis pulses palpable bilaterally. Posterior tibial pulses palpable bilaterally. Capillary refill normal to all digits.  No cyanosis or clubbing noted. Pedal hair growth normal.  Neurologic Normal speech. Oriented to person, place, and time. Protective sensation absent  Dermatologic  wound healed right foot no warmth erythema no signs of acute infection  Orthopedic: No pain to palpation either foot.   Radiographs: None Assessment:   1. Skin ulcer of right foot, limited to breakdown of skin [facility])    Plan:  Patient was evaluated and treated and all questions answered.  Ulcer Right 5th MPJ -Wound appears healed no need for Betadine.  Advised advised to monitor for signs of recurrence call the office should they persist.  Follow-up in 1 month for recheck  Return in about 1 month (around 05/29/2019) for Wound Check, Right.

## 2019-05-23 ENCOUNTER — Telehealth: Payer: Self-pay

## 2019-05-23 MED ORDER — FUROSEMIDE 20 MG PO TABS: ORAL_TABLET | ORAL | 1 refills | Status: DC

## 2019-05-23 NOTE — Addendum Note (Signed)
Addended by: Thompson Grayer on: 05/23/2019 01:14 PM   Modules accepted: Orders

## 2019-05-23 NOTE — Telephone Encounter (Signed)
Spoke with pts son, Gwyndolyn Saxon who is concerned about swelling in Mr. H[place]path feet and ankles. Pt has not been weighing himself and has not been experiencing and SOB, CP or diaphresis. Pt's son is wondering if a diuretic may help his father's edema? Will route to Dr. Radford Pax and Theodosia Blender, PA for advisement. Educated patients son on elevating pts feet and avoiding sodium.

## 2019-05-23 NOTE — Telephone Encounter (Signed)
I spoke with pt's son and gave him information from Dr. Radford Pax. Will send prescription to [facility].

## 2019-05-23 NOTE — Telephone Encounter (Signed)
Will defer to Dr. Radford Pax as I am not taking care of this patient. Thanks!  KT

## 2019-05-23 NOTE — Telephone Encounter (Signed)
OK to start lasix 20mg  daily PRN for edema.  Needs to follow < 2gm Na diet.  Call if edema does not improve

## 2019-05-25 DIAGNOSIS — H25813 Combined forms of age-related cataract, bilateral: Secondary | ICD-10-CM | POA: Diagnosis not present

## 2019-05-25 DIAGNOSIS — E119 Type 2 diabetes mellitus without complications: Secondary | ICD-10-CM | POA: Diagnosis not present

## 2019-05-25 DIAGNOSIS — H52213 Irregular astigmatism, bilateral: Secondary | ICD-10-CM | POA: Diagnosis not present

## 2019-05-25 DIAGNOSIS — H0100A Unspecified blepharitis right eye, upper and lower eyelids: Secondary | ICD-10-CM | POA: Diagnosis not present

## 2019-05-25 DIAGNOSIS — H0100B Unspecified blepharitis left eye, upper and lower eyelids: Secondary | ICD-10-CM | POA: Diagnosis not present

## 2019-05-25 DIAGNOSIS — H527 Unspecified disorder of refraction: Secondary | ICD-10-CM | POA: Diagnosis not present

## 2019-05-29 DIAGNOSIS — Z20828 Contact with and (suspected) exposure to other viral communicable diseases: Secondary | ICD-10-CM | POA: Diagnosis not present

## 2019-05-29 DIAGNOSIS — Z01812 Encounter for preprocedural laboratory examination: Secondary | ICD-10-CM | POA: Diagnosis not present

## 2019-06-02 ENCOUNTER — Ambulatory Visit: Payer: Medicare Other | Admitting: Podiatry

## 2019-06-02 DIAGNOSIS — H52213 Irregular astigmatism, bilateral: Secondary | ICD-10-CM | POA: Diagnosis not present

## 2019-06-02 DIAGNOSIS — H25812 Combined forms of age-related cataract, left eye: Secondary | ICD-10-CM | POA: Insufficient documentation

## 2019-06-02 DIAGNOSIS — Z833 Family history of diabetes mellitus: Secondary | ICD-10-CM | POA: Diagnosis not present

## 2019-06-02 DIAGNOSIS — H0100B Unspecified blepharitis left eye, upper and lower eyelids: Secondary | ICD-10-CM | POA: Diagnosis not present

## 2019-06-02 DIAGNOSIS — Z79899 Other long term (current) drug therapy: Secondary | ICD-10-CM | POA: Diagnosis not present

## 2019-06-02 DIAGNOSIS — Z8249 Family history of ischemic heart disease and other diseases of the circulatory system: Secondary | ICD-10-CM | POA: Diagnosis not present

## 2019-06-02 DIAGNOSIS — H527 Unspecified disorder of refraction: Secondary | ICD-10-CM | POA: Diagnosis not present

## 2019-06-02 DIAGNOSIS — I10 Essential (primary) hypertension: Secondary | ICD-10-CM | POA: Diagnosis not present

## 2019-06-02 DIAGNOSIS — H2513 Age-related nuclear cataract, bilateral: Secondary | ICD-10-CM | POA: Diagnosis not present

## 2019-06-02 DIAGNOSIS — H0100A Unspecified blepharitis right eye, upper and lower eyelids: Secondary | ICD-10-CM | POA: Diagnosis not present

## 2019-06-02 DIAGNOSIS — F329 Major depressive disorder, single episode, unspecified: Secondary | ICD-10-CM | POA: Diagnosis not present

## 2019-06-02 DIAGNOSIS — H25813 Combined forms of age-related cataract, bilateral: Secondary | ICD-10-CM | POA: Diagnosis not present

## 2019-06-02 DIAGNOSIS — E1136 Type 2 diabetes mellitus with diabetic cataract: Secondary | ICD-10-CM | POA: Diagnosis not present

## 2019-06-02 DIAGNOSIS — E785 Hyperlipidemia, unspecified: Secondary | ICD-10-CM | POA: Diagnosis not present

## 2019-06-03 DIAGNOSIS — Z961 Presence of intraocular lens: Secondary | ICD-10-CM | POA: Diagnosis not present

## 2019-06-03 DIAGNOSIS — H0100B Unspecified blepharitis left eye, upper and lower eyelids: Secondary | ICD-10-CM | POA: Diagnosis not present

## 2019-06-03 DIAGNOSIS — H0100A Unspecified blepharitis right eye, upper and lower eyelids: Secondary | ICD-10-CM | POA: Diagnosis not present

## 2019-06-03 DIAGNOSIS — H25813 Combined forms of age-related cataract, bilateral: Secondary | ICD-10-CM | POA: Diagnosis not present

## 2019-06-03 DIAGNOSIS — H527 Unspecified disorder of refraction: Secondary | ICD-10-CM | POA: Diagnosis not present

## 2019-06-03 DIAGNOSIS — H52213 Irregular astigmatism, bilateral: Secondary | ICD-10-CM | POA: Diagnosis not present

## 2019-06-03 DIAGNOSIS — E119 Type 2 diabetes mellitus without complications: Secondary | ICD-10-CM | POA: Diagnosis not present

## 2019-06-05 DIAGNOSIS — Z01812 Encounter for preprocedural laboratory examination: Secondary | ICD-10-CM | POA: Diagnosis not present

## 2019-06-05 DIAGNOSIS — Z20828 Contact with and (suspected) exposure to other viral communicable diseases: Secondary | ICD-10-CM | POA: Diagnosis not present

## 2019-06-05 DIAGNOSIS — H2511 Age-related nuclear cataract, right eye: Secondary | ICD-10-CM | POA: Diagnosis not present

## 2019-06-09 DIAGNOSIS — Z7984 Long term (current) use of oral hypoglycemic drugs: Secondary | ICD-10-CM | POA: Diagnosis not present

## 2019-06-09 DIAGNOSIS — E1136 Type 2 diabetes mellitus with diabetic cataract: Secondary | ICD-10-CM | POA: Diagnosis not present

## 2019-06-09 DIAGNOSIS — F339 Major depressive disorder, recurrent, unspecified: Secondary | ICD-10-CM | POA: Diagnosis not present

## 2019-06-09 DIAGNOSIS — H0100B Unspecified blepharitis left eye, upper and lower eyelids: Secondary | ICD-10-CM | POA: Diagnosis not present

## 2019-06-09 DIAGNOSIS — H2181 Floppy iris syndrome: Secondary | ICD-10-CM | POA: Insufficient documentation

## 2019-06-09 DIAGNOSIS — H25811 Combined forms of age-related cataract, right eye: Secondary | ICD-10-CM | POA: Diagnosis not present

## 2019-06-09 DIAGNOSIS — H269 Unspecified cataract: Secondary | ICD-10-CM | POA: Diagnosis not present

## 2019-06-09 DIAGNOSIS — I10 Essential (primary) hypertension: Secondary | ICD-10-CM | POA: Diagnosis not present

## 2019-06-09 DIAGNOSIS — N401 Enlarged prostate with lower urinary tract symptoms: Secondary | ICD-10-CM | POA: Diagnosis not present

## 2019-06-09 DIAGNOSIS — Z952 Presence of prosthetic heart valve: Secondary | ICD-10-CM | POA: Diagnosis not present

## 2019-06-09 DIAGNOSIS — Z79899 Other long term (current) drug therapy: Secondary | ICD-10-CM | POA: Diagnosis not present

## 2019-06-09 DIAGNOSIS — H52213 Irregular astigmatism, bilateral: Secondary | ICD-10-CM | POA: Diagnosis not present

## 2019-06-09 DIAGNOSIS — E785 Hyperlipidemia, unspecified: Secondary | ICD-10-CM | POA: Diagnosis not present

## 2019-06-10 DIAGNOSIS — H52213 Irregular astigmatism, bilateral: Secondary | ICD-10-CM | POA: Diagnosis not present

## 2019-06-10 DIAGNOSIS — Z961 Presence of intraocular lens: Secondary | ICD-10-CM | POA: Diagnosis not present

## 2019-06-23 DIAGNOSIS — Z23 Encounter for immunization: Secondary | ICD-10-CM | POA: Diagnosis not present

## 2019-06-30 ENCOUNTER — Encounter: Payer: Self-pay | Admitting: Podiatry

## 2019-06-30 ENCOUNTER — Other Ambulatory Visit: Payer: Self-pay

## 2019-06-30 ENCOUNTER — Ambulatory Visit (INDEPENDENT_AMBULATORY_CARE_PROVIDER_SITE_OTHER): Payer: Medicare Other | Admitting: Podiatry

## 2019-06-30 VITALS — Temp 97.5°F

## 2019-06-30 DIAGNOSIS — B351 Tinea unguium: Secondary | ICD-10-CM | POA: Diagnosis not present

## 2019-06-30 DIAGNOSIS — L97511 Non-pressure chronic ulcer of other part of right foot limited to breakdown of skin: Secondary | ICD-10-CM

## 2019-06-30 DIAGNOSIS — E1142 Type 2 diabetes mellitus with diabetic polyneuropathy: Secondary | ICD-10-CM | POA: Diagnosis not present

## 2019-07-01 ENCOUNTER — Ambulatory Visit: Payer: Medicare Other | Admitting: Podiatry

## 2019-07-27 NOTE — Progress Notes (Signed)
Subjective:  Patient ID: Angel Barton, male    DOB: 04/10/1926,  MRN: [id number]  Chief Complaint  Patient presents with  . Callouses     right arch, right 5th met.    85 y.o. male presents for wound care. Hx as above.  Believes that the wound is remained healed complains of thickened toenails to both feet  Review of Systems: Negative except as noted in the HPI. Denies N/V/F/Ch.  Past Medical History:  Diagnosis Date  . ADD (attention deficit disorder)   . Anxiety   . Arthritis    left leg  . BPH (benign prostatic hyperplasia)   . CKD (chronic kidney disease), stage V ([place]) 08/14/2018   pt unaware  . Depression   . Diabetes mellitus without complication ([place])   . Does use hearing aid   . GERD (gastroesophageal reflux disease)   . Grade I diastolic dysfunction 37/85/8850   Noted on ECHO  . Hyperlipidemia   . Hypertension   . Kidney stones   . LAE (left atrial enlargement) 02/15/2018   Moderate, Noted on ECHO  . Left bundle branch block 08/13/2018   Noted on EKG   . LVH (left ventricular hypertrophy) 02/15/2018   Mild, Noted on ECHO  . Nephrolithiasis 08/14/2018  . Nocturia   . Pedal edema 02/2018  . Urinary retention 08/14/2018    Current Outpatient Medications:  .  atenolol (TENORMIN) 50 MG tablet, Take 50 mg by mouth daily., Disp: , Rfl:  .  Cyanocobalamin (VITAMIN B 12 PO), Take by mouth daily., Disp: , Rfl:  .  furosemide (LASIX) 20 MG tablet, Take one tablet by mouth daily as needed for swelling, Disp: 90 tablet, Rfl: 1 .  metFORMIN (GLUCOPHAGE-XR) 500 MG 24 hr tablet, Take 500 mg by mouth daily with breakfast., Disp: , Rfl:  .  methylphenidate (RITALIN) 20 MG tablet, Take 20 mg by mouth daily. , Disp: , Rfl:  .  PARoxetine (PAXIL) 40 MG tablet, Take 40 mg by mouth every morning., Disp: , Rfl:  .  simvastatin (ZOCOR) 20 MG tablet, Take 20 mg by mouth every morning. , Disp: , Rfl:  .  terazosin (HYTRIN) 10 MG capsule, Take 10 mg by mouth at bedtime., Disp: ,  Rfl:   Social History   Tobacco Use  Smoking Status Former Smoker  Smokeless Tobacco Never Used    No Known Allergies Objective:   Vitals:   06/30/19 1207  Temp: (!) 97.5 F (36.4 C)   There is no height or weight on file to calculate BMI. Constitutional Well developed. Well nourished.  Vascular Dorsalis pedis pulses palpable bilaterally. Posterior tibial pulses palpable bilaterally. Capillary refill normal to all digits.  No cyanosis or clubbing noted. Pedal hair growth normal.  Neurologic Normal speech. Oriented to person, place, and time. Protective sensation absent  Dermatologic  wound healed right foot  Nails thickened and dystrophic  Orthopedic: No pain to palpation either foot.   Radiographs: None Assessment:   1. Skin ulcer of right foot, limited to breakdown of skin ([place])   2. Onychomycosis of multiple toenails with type 2 diabetes mellitus and peripheral neuropathy ([place])    Plan:  Patient was evaluated and treated and all questions answered.  Ulcer Right 5th MPJ -Wounds remained healed.  No open ulceration noted  Onychomycosis, diabetes with peripheral neuropathy -Nails debrided x10  Procedure: Nail Debridement Rationale: Patient meets criteria for routine foot care due to DPN Type of Debridement: manual, sharp debridement. Instrumentation: Nail nipper, rotary burr.  Number of Nails: 10     No follow-ups on file.

## 2019-08-18 DIAGNOSIS — L989 Disorder of the skin and subcutaneous tissue, unspecified: Secondary | ICD-10-CM | POA: Diagnosis not present

## 2019-09-14 IMAGING — DX DG HIP (WITH OR WITHOUT PELVIS) 2-3V*L*
3 series · 3 of 3 positions shown · non-contrast
Comparison: None.

CLINICAL DATA: Left hip pain x3 days, no known injury

EXAM:
DG HIP (WITH OR WITHOUT PELVIS) 2-3V LEFT

[pelvis ap]
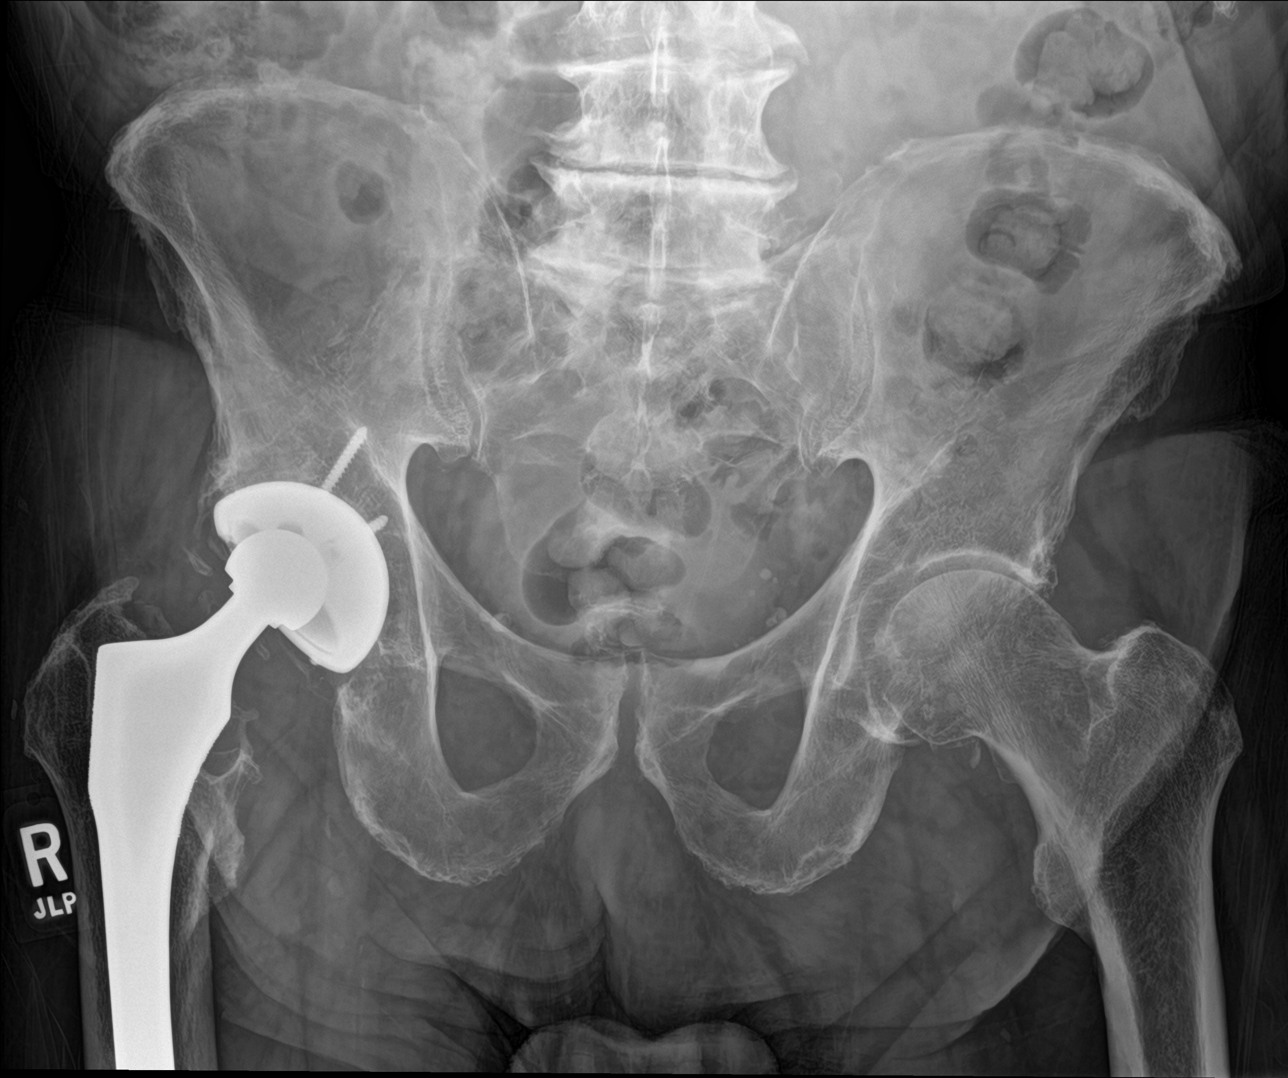

[hip ap]
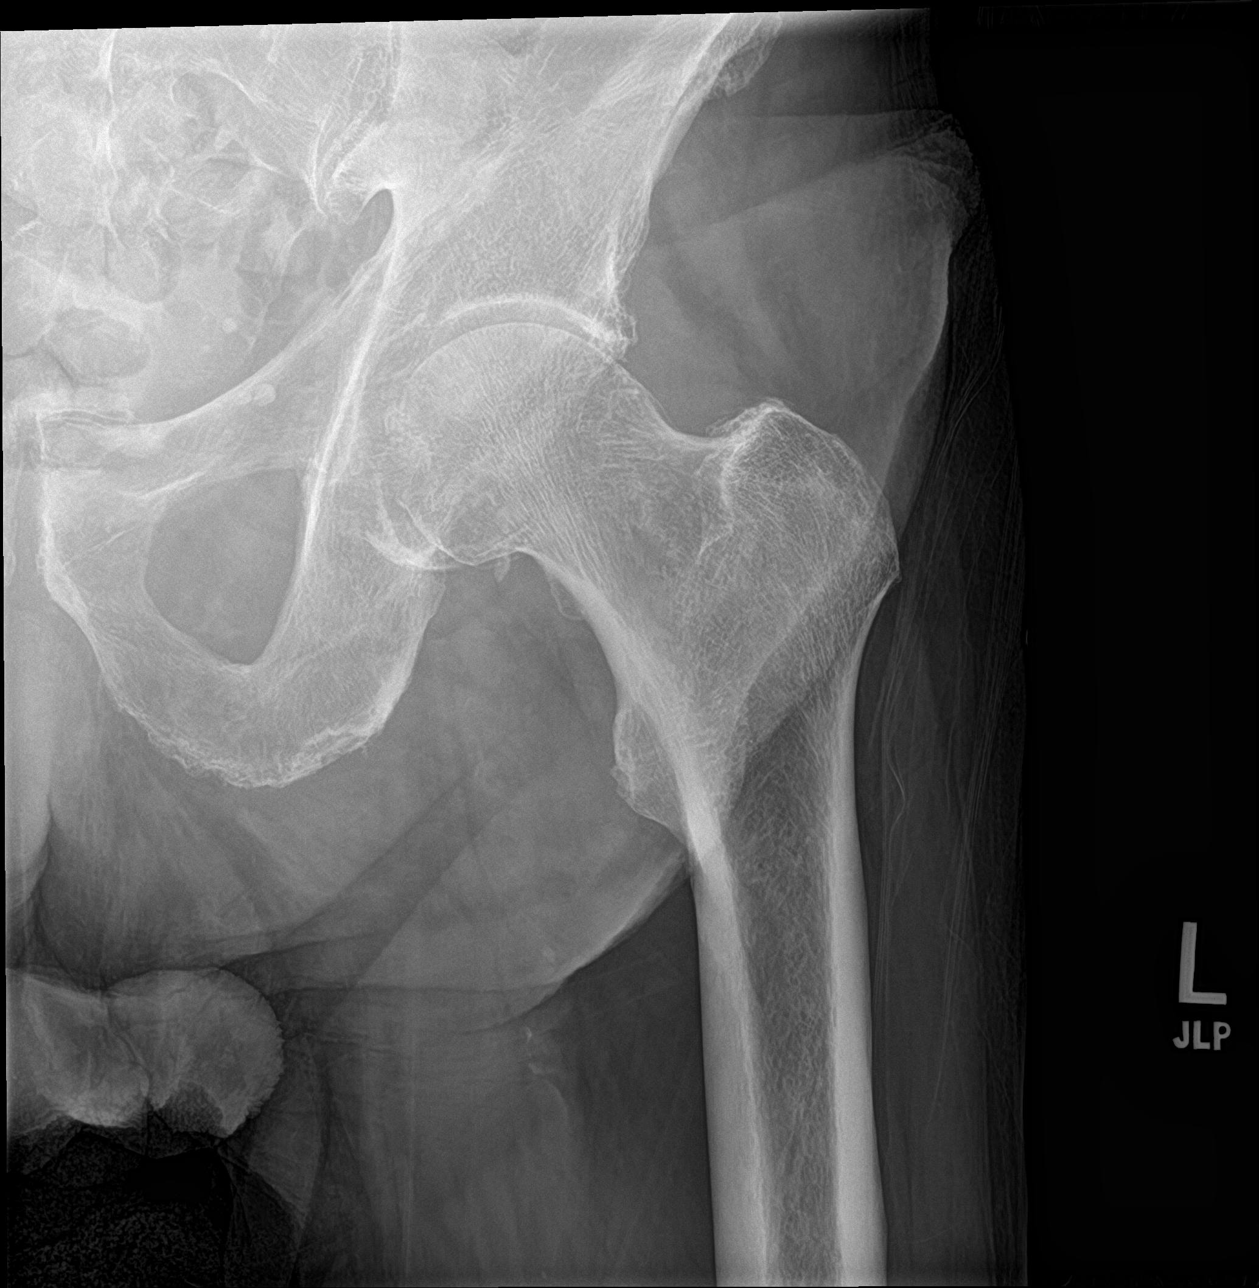

[hip lat]
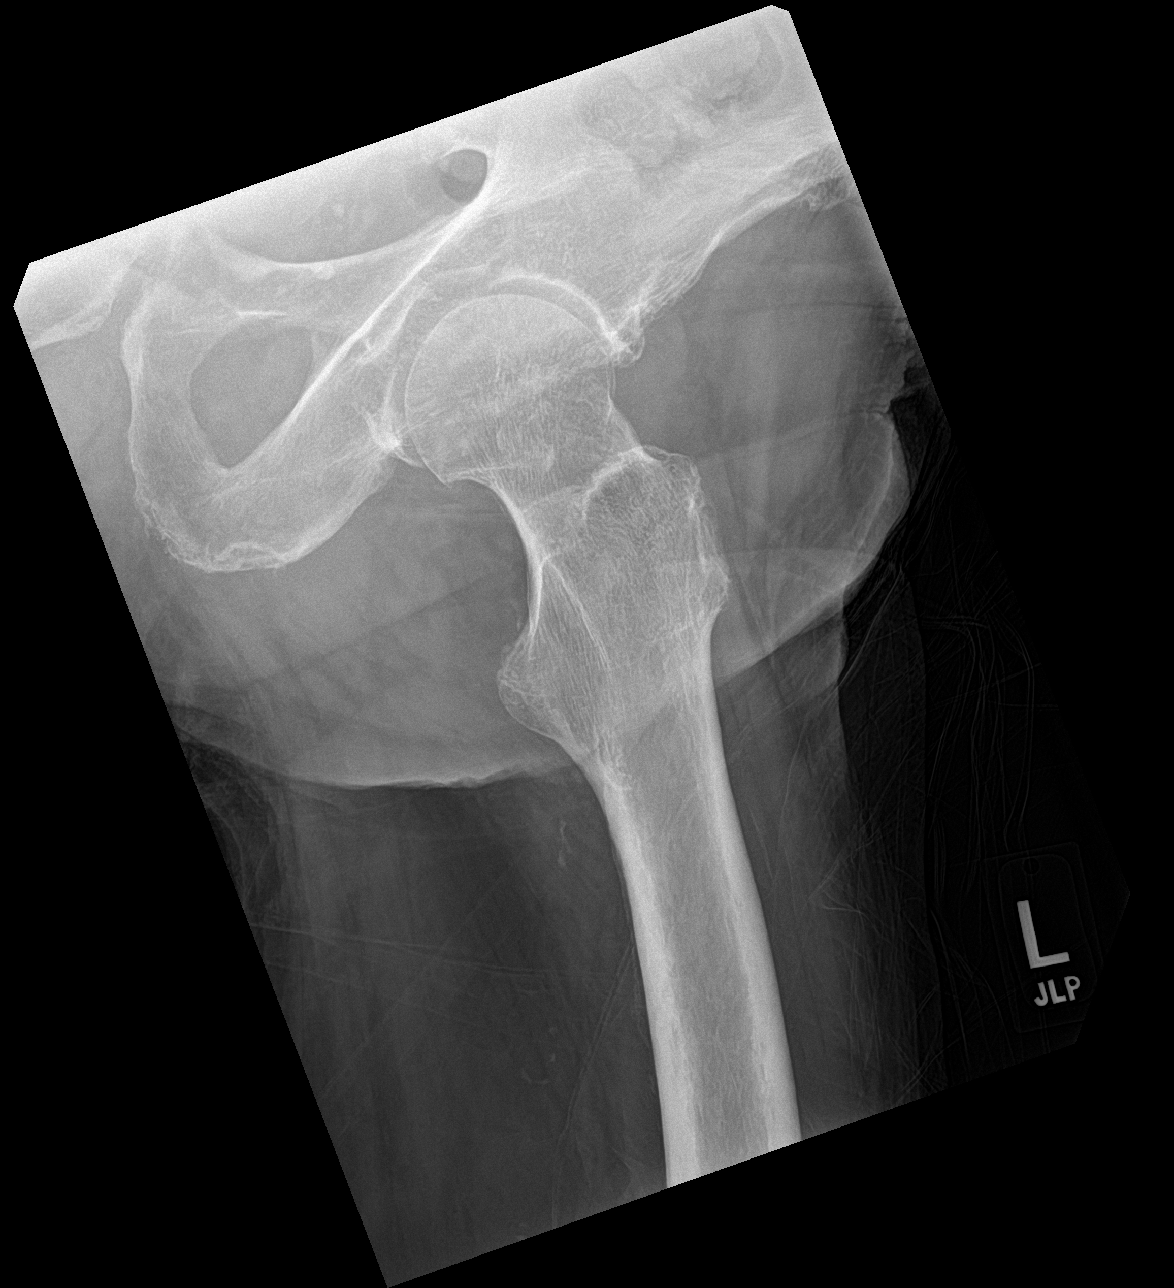

[3 of 3 positions shown; findings below may reference images not displayed]

FINDINGS: No fracture or dislocation is seen.

Right hip arthroplasty, in satisfactory position.

Left hip joint space is preserved.

Visualized bony pelvis appears intact.

Degenerative changes the lower lumbar spine.
IMPRESSION: No acute osseus abnormality is seen.

Status post right hip arthroplasty.

## 2019-09-16 ENCOUNTER — Other Ambulatory Visit: Payer: Self-pay

## 2019-09-16 ENCOUNTER — Telehealth (INDEPENDENT_AMBULATORY_CARE_PROVIDER_SITE_OTHER): Payer: Medicare Other | Admitting: Cardiology

## 2019-09-16 VITALS — BP 134/65 | HR 58 | Ht 67.0 in | Wt 155.0 lb

## 2019-09-16 DIAGNOSIS — I350 Nonrheumatic aortic (valve) stenosis: Secondary | ICD-10-CM | POA: Diagnosis not present

## 2019-09-16 DIAGNOSIS — I10 Essential (primary) hypertension: Secondary | ICD-10-CM

## 2019-09-16 DIAGNOSIS — I447 Left bundle-branch block, unspecified: Secondary | ICD-10-CM | POA: Diagnosis not present

## 2019-09-16 DIAGNOSIS — E119 Type 2 diabetes mellitus without complications: Secondary | ICD-10-CM | POA: Diagnosis not present

## 2019-09-16 NOTE — Addendum Note (Signed)
Addended by: Antonieta Iba on: 09/16/2019 11:43 AM   Modules accepted: Orders

## 2019-09-16 NOTE — Progress Notes (Signed)
Virtual Visit via Video Note   This visit type was conducted due to national recommendations for restrictions regarding the COVID-19 Pandemic (e.g. social distancing) in an effort to limit this patient's exposure and mitigate transmission in our community.  Due to his co-morbid illnesses, this patient is at least at moderate risk for complications without adequate follow up.  This format is felt to be most appropriate for this patient at this time.  All issues noted in this document were discussed and addressed.  A limited physical exam was performed with this format.  Please refer to the patient's chart for his consent to telehealth for [facility]  Evaluation Performed:  Follow-up visit  This visit type was conducted due to national recommendations for restrictions regarding the COVID-19 Pandemic (e.g. social distancing).  This format is felt to be most appropriate for this patient at this time.  All issues noted in this document were discussed and addressed.  No physical exam was performed (except for noted visual exam findings with Video Visits).  Please refer to the patient's chart (MyChart message for video visits and phone note for telephone visits) for the patient's consent to telehealth for [facility].  Date:  09/16/2019   ID:  Angel Barton, DOB Mar 15, 1926, MRN [id number]  Patient Location:  [place]  Provider location:   [place]  PCP:  Lyman Bishop, DO  Cardiologist:  Fransico Him, MD  Electrophysiologist:  None   Chief Complaint:  AS,  HTN, HLD  History of Present Illness:    Angel Barton is a 85 y.o. male who presents via audio/video conferencing for a telehealth visit today.    This is a 85yo male with a history of severe AS, CKD stage 5, DM, GERD, HTN, hyperlipidemia and  LBBB.  He is s/p TAVR in 2018.  He is here today for followup and is doing well.  He denies any chest pain or pressure, SOB, DOE, PND, orthopnea, LE edema, dizziness, palpitations or  syncope. He is compliant with his meds and is tolerating meds with no SE.    The patient does not have symptoms concerning for COVID-19 infection (fever, chills, cough, or new shortness of breath).    Prior CV studies:   The following studies were reviewed today:  none  Past Medical History:  Diagnosis Date  . ADD (attention deficit disorder)   . Anxiety   . Arthritis    left leg  . BPH (benign prostatic hyperplasia)   . CKD (chronic kidney disease), stage V ([place]) 08/14/2018   pt unaware  . Depression   . Diabetes mellitus without complication ([place])   . Does use hearing aid   . GERD (gastroesophageal reflux disease)   . Grade I diastolic dysfunction 02/58/5277   Noted on ECHO  . Hyperlipidemia   . Hypertension   . Kidney stones   . LAE (left atrial enlargement) 02/15/2018   Moderate, Noted on ECHO  . Left bundle branch block 08/13/2018   Noted on EKG   . LVH (left ventricular hypertrophy) 02/15/2018   Mild, Noted on ECHO  . Nephrolithiasis 08/14/2018  . Nocturia   . Pedal edema 02/2018  . Urinary retention 08/14/2018   Past Surgical History:  Procedure Laterality Date  . AORTIC VALVE REPLACEMENT  06/26/2017  . JOINT REPLACEMENT    . right hip replacement  1994  . TRANSURETHRAL RESECTION OF PROSTATE N/A 09/20/2018   Procedure: TRANSURETHRAL RESECTION OF THE PROSTATE (TURP);  Surgeon: Lucas Mallow, MD;  Location: [facility];  Service: Urology;  Laterality: N/A;     Current Meds  Medication Sig  . atenolol (TENORMIN) 50 MG tablet Take 50 mg by mouth daily.  . Cyanocobalamin (VITAMIN B 12 PO) Take by mouth daily.  . metFORMIN (GLUCOPHAGE-XR) 500 MG 24 hr tablet Take 500 mg by mouth daily with breakfast.  . methylphenidate (RITALIN) 20 MG tablet Take 20 mg by mouth daily.   Marland Kitchen PARoxetine (PAXIL) 40 MG tablet Take 40 mg by mouth every morning.  . simvastatin (ZOCOR) 20 MG tablet Take 20 mg by mouth every morning.   . terazosin (HYTRIN) 10 MG capsule Take 10 mg by  mouth at bedtime.     Allergies:   Patient has no known allergies.   Social History   Tobacco Use  . Smoking status: Former Research scientist (life sciences)  . Smokeless tobacco: Never Used  Substance Use Topics  . Alcohol use: Never    Frequency: Never  . Drug use: Never     Family Hx: The patient's family history includes Prostate cancer in his father.  ROS:   Please see the history of present illness.     All other systems reviewed and are negative.   Labs/Other Tests and Data Reviewed:    Recent Labs: 09/21/2018: Hemoglobin 9.3   Recent Lipid Panel No results found for: CHOL, TRIG, HDL, CHOLHDL, LDLCALC, LDLDIRECT  Wt Readings from Last 3 Encounters:  09/16/19 155 lb (70.3 kg)  03/22/19 149 lb (67.6 kg)  01/21/19 151 lb (68.5 kg)     Objective:    Vital Signs:  BP 134/65   Pulse (!) 58   Ht 5\' 7"  (1.702 m)   Wt 155 lb (70.3 kg)   SpO2 96%   BMI 24.28 kg/m    CONSTITUTIONAL:  Well nourished, well developed male in no acute distress.  EYES: anicteric MOUTH: oral mucosa is pink RESPIRATORY: Normal respiratory effort, symmetric expansion CARDIOVASCULAR: No peripheral edema SKIN: No rash, lesions or ulcers MUSCULOSKELETAL: no digital cyanosis NEURO: Cranial Nerves II-XII grossly intact, moves all extremities PSYCH: Intact judgement and insight.  A&O x 3, Mood/affect appropriate   ASSESSMENT & PLAN:    1.  Severe AS - s/p TAVR in 2018 -now that he is 2 years out I will repeat a 2D echo to assure normal valve function and recheck gradient - due to COVID I will put the echo to next summer in July -continue ASA 81mg  daily  2.  HTN -BP controlled -continue Atenolol 50mg  daily  3.  LBBB -no ischemia on nuclear stress test 2019  4. Type 2 DM -followed by PCP -HbA1C 6.5% a year ago -continue Metformin and statin  5.  Chronic LE edema -well controlled with compression hose  COVID-19 Education: The signs and symptoms of COVID-19 were discussed with the patient and how to  seek care for testing (follow up with PCP or arrange E-visit).  The importance of social distancing was discussed today.  Patient Risk:   After full review of this patient's clinical status, I feel that they are at least moderate risk at this time.  Time:   Today, I have spent 20 minutes directly with the patient on telemedicine discussing medical problems including AS, LBBB, DM, HTN.  We also reviewed the symptoms of COVID 19 and the ways to protect against contracting the virus with telehealth technology.  I spent an additional 5 minutes reviewing patient's chart including 2D echo.  Medication Adjustments/Labs and Tests Ordered: Current medicines are reviewed  at length with the patient today.  Concerns regarding medicines are outlined above.  Tests Ordered: No orders of the defined types were placed in this encounter.  Medication Changes: No orders of the defined types were placed in this encounter.   Disposition:  Follow up in 1 year(s)  Signed, Fransico Him, MD  09/16/2019 11:23 AM    [facility]

## 2019-09-16 NOTE — Patient Instructions (Signed)
Medication Instructions:  Your physician has requested that you have an echocardiogram. Echocardiography is a painless test that uses sound waves to create images of your heart. It provides your doctor with information about the size and shape of your heart and how well your heart's chambers and valves are working. This procedure takes approximately one hour. There are no restrictions for this procedure.  *If you need a refill on your cardiac medications before your next appointment, please call your pharmacy*  Testing/Procedures: Your physician has requested that you have an echocardiogram in July 2021. Echocardiography is a painless test that uses sound waves to create images of your heart. It provides your doctor with information about the size and shape of your heart and how well your heart's chambers and valves are working. This procedure takes approximately one hour. There are no restrictions for this procedure.   Follow-Up: At [facility], you and your health needs are our priority.  As part of our continuing mission to provide you with exceptional heart care, we have created designated Provider Care Teams.  These Care Teams include your primary Cardiologist (physician) and Advanced Practice Providers (APPs -  Physician Assistants and Nurse Practitioners) who all work together to provide you with the care you need, when you need it.  Your next appointment:   1 year(s)  The format for your next appointment:   Either In Person or Virtual  Provider:   Fransico Him, MD

## 2019-09-30 ENCOUNTER — Other Ambulatory Visit: Payer: Self-pay

## 2019-10-14 IMAGING — US US RENAL
1 series · 14 of 25 positions shown · non-contrast
Comparison: None.

CLINICAL DATA: Acute renal insufficiency.

EXAM:
RENAL / URINARY TRACT ULTRASOUND COMPLETE

[Series 1: us renal · 0.25mm/px · 14 of 36 slices shown]
[im 1/36]
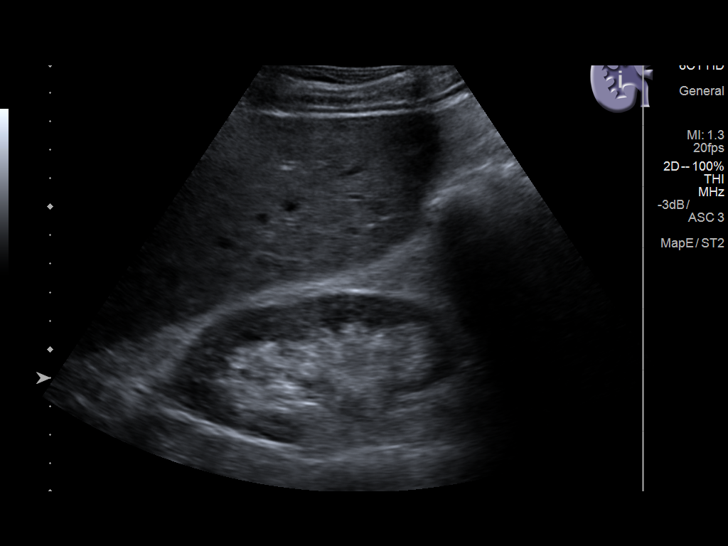
[im 3/36]
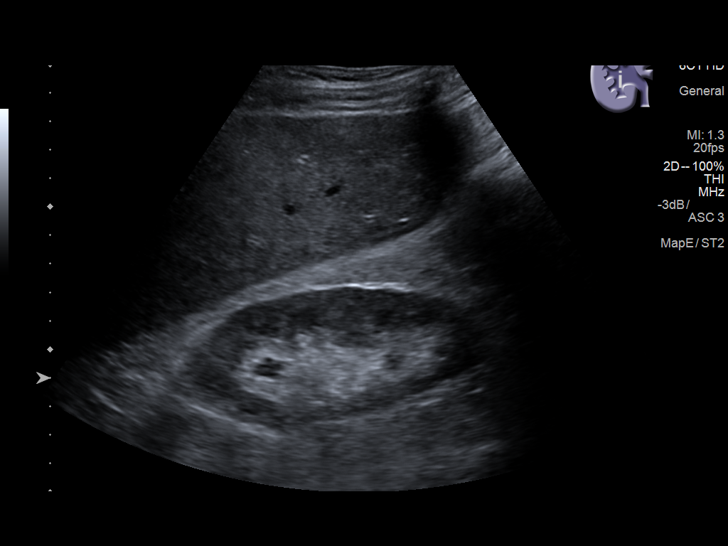
[im 6/36]
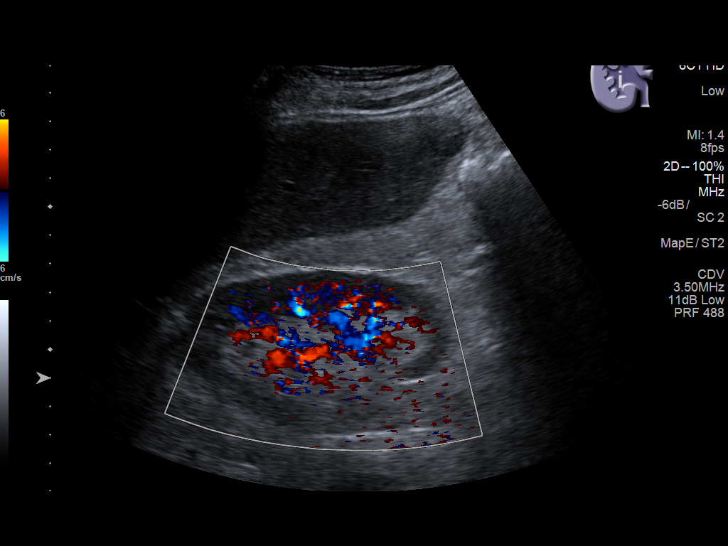
[im 9/36]
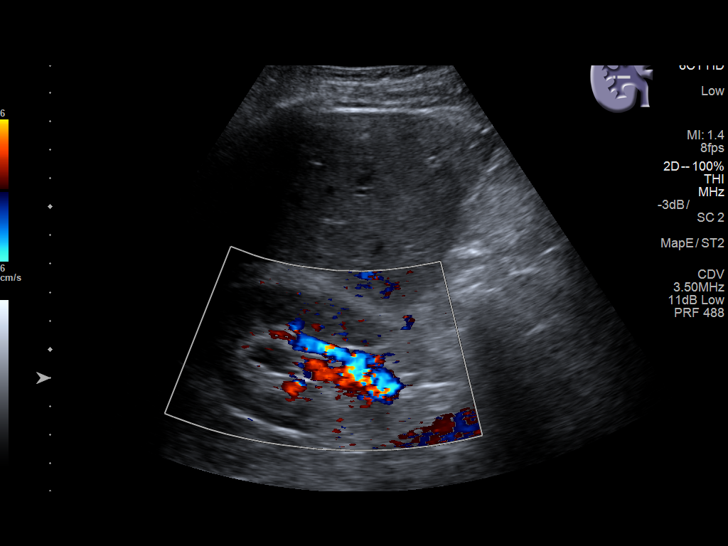
[im 12/36]
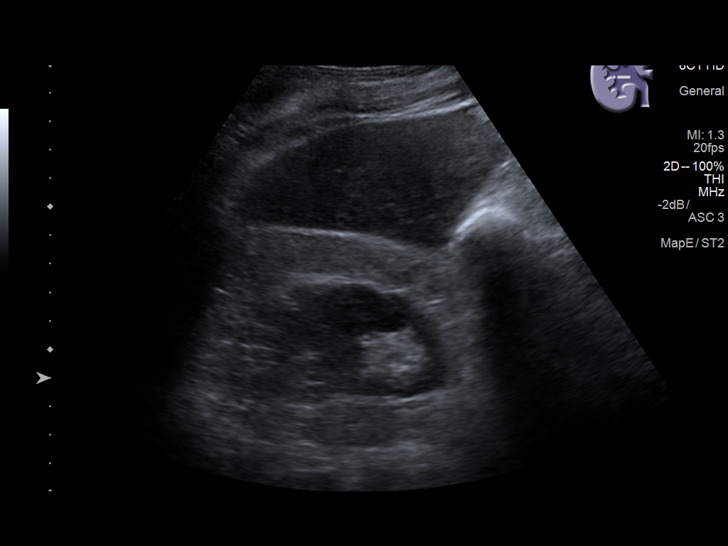
[im 14/36]
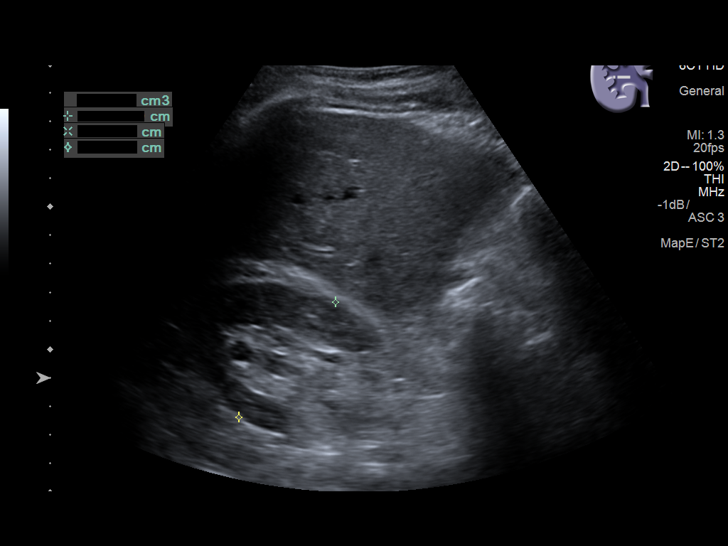
[im 17/36]
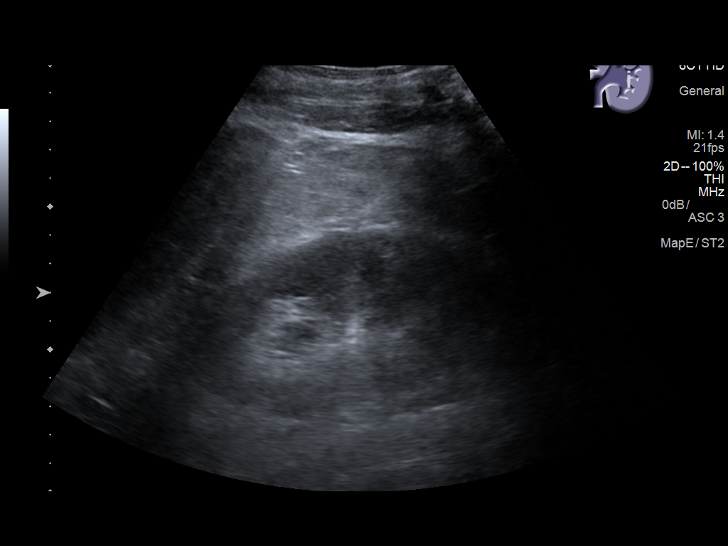
[im 19/36]
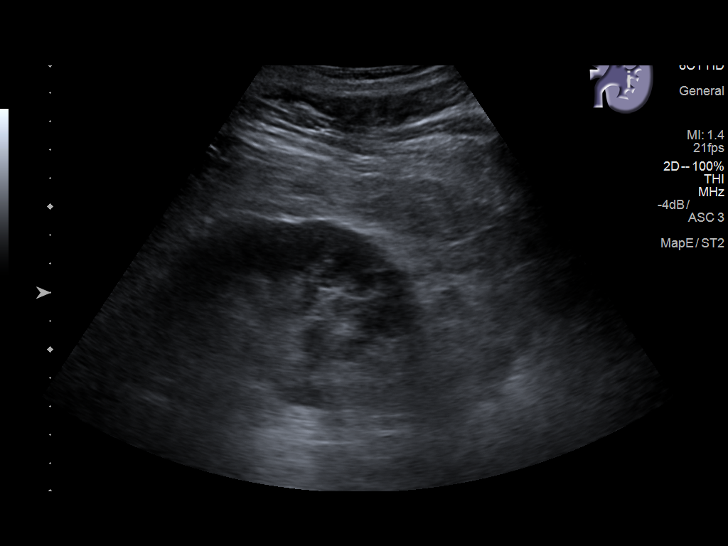
[im 22/36]
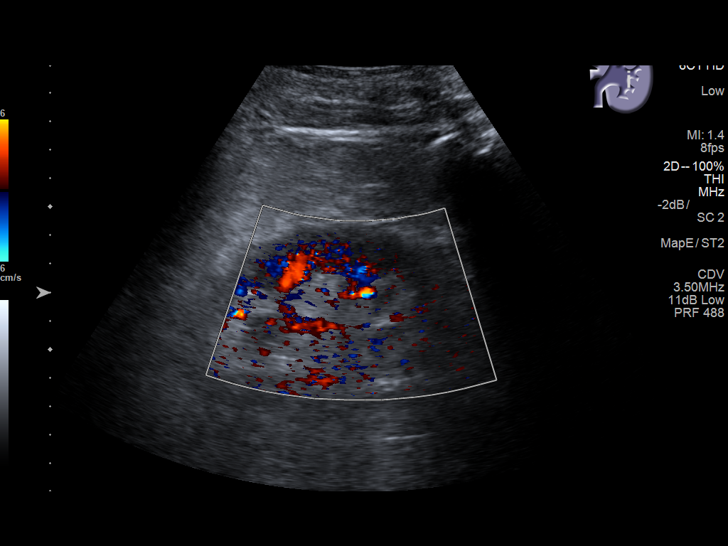
[im 24/36]
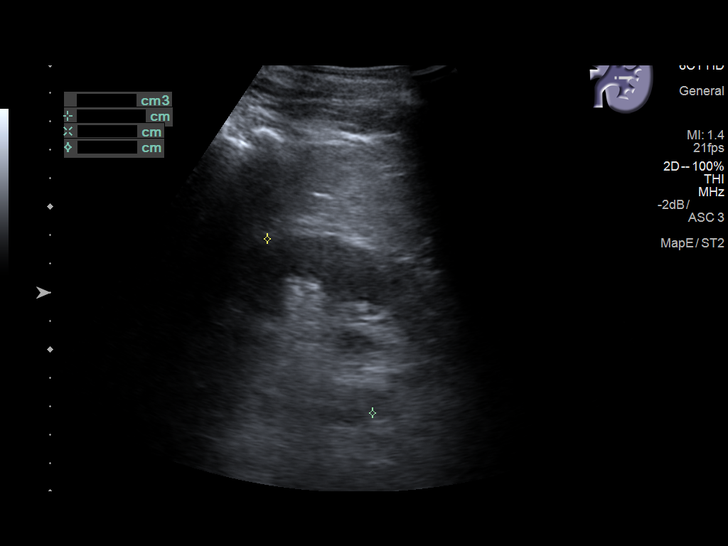
[im 27/36]
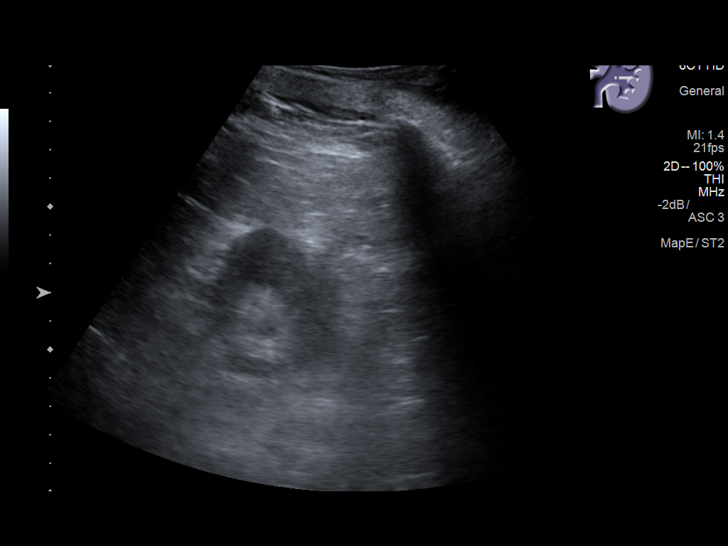
[im 30/36]
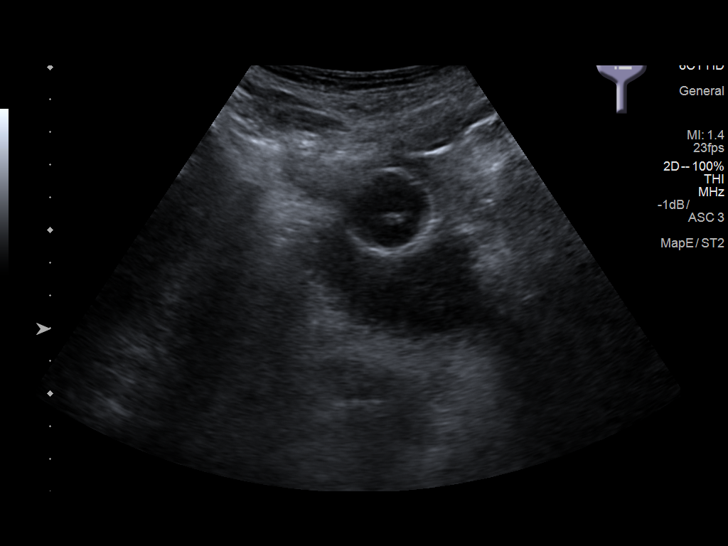
[im 33/36]
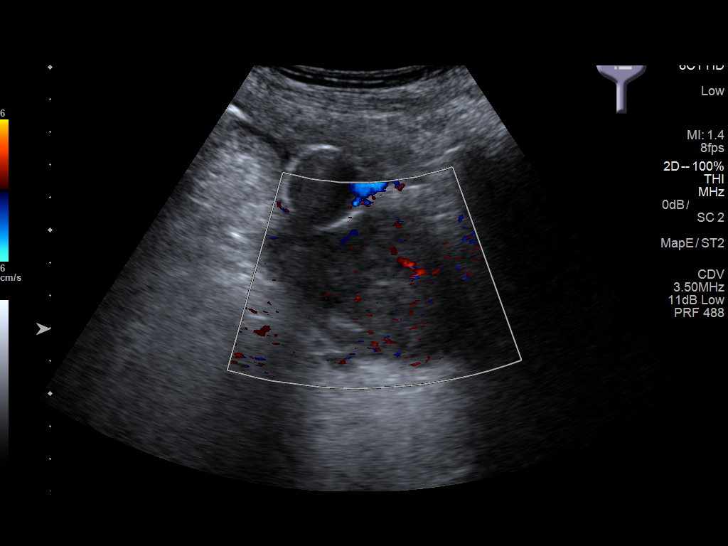
[im 36/36]
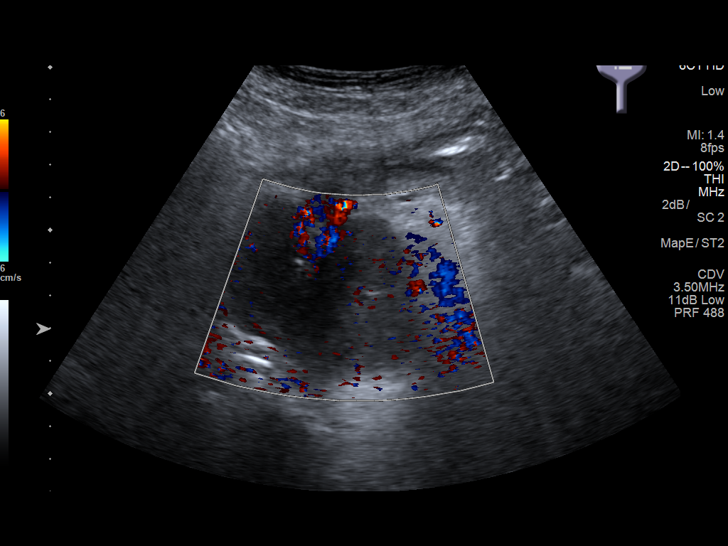

[14 of 25 positions shown; findings below may reference images not displayed]

FINDINGS: Right Kidney:

Renal measurements: 10.9 x 5.4 x 5.3 cm = volume: 161 mL .
Echogenicity within normal limits. No mass visualized. Mild
pelviectasis.

Left Kidney:

Renal measurements: 11.4 x 6.4 x 7.1 cm = volume: 271 mL.
Echogenicity within normal limits. No mass or hydronephrosis
visualized.

Bladder:

Bladder is decompressed around urinary Foley.

The prostate gland is enlarged measuring 7.1 x 4.7 x 5.9 cm.
IMPRESSION: Mild right renal pelviectasis.

Normal appearance of the left kidney.

Decompressed urinary bladder, which is not well characterized.

Enlarged prostate gland.  Please correlate clinically.

## 2019-11-04 ENCOUNTER — Ambulatory Visit (INDEPENDENT_AMBULATORY_CARE_PROVIDER_SITE_OTHER): Payer: Medicare Other | Admitting: Podiatry

## 2019-11-04 ENCOUNTER — Other Ambulatory Visit: Payer: Self-pay

## 2019-11-04 DIAGNOSIS — B351 Tinea unguium: Secondary | ICD-10-CM

## 2019-11-04 DIAGNOSIS — M79676 Pain in unspecified toe(s): Secondary | ICD-10-CM | POA: Diagnosis not present

## 2019-11-04 DIAGNOSIS — M79609 Pain in unspecified limb: Secondary | ICD-10-CM

## 2019-11-04 NOTE — Progress Notes (Signed)
  Subjective:  Patient ID: Angel Barton, male    DOB: 04/28/1926,  MRN: [id number]  Chief Complaint  Patient presents with  . Nail Problem    Nail trim 1-5 bilateral    86 y.o. male presents with the above complaint. History confirmed with patient.   Objective:  Physical Exam: warm, good capillary refill, nail exam onychomycosis of the toenails and pain to palpation, no trophic changes or ulcerative lesions, normal DP and PT pulses, and normal sensory exam. HPK right 5th met plantar  No images are attached to the encounter.  Assessment:   1. Pain due to onychomycosis of nail     Plan:  Patient was evaluated and treated and all questions answered.  Onychomycosis -Nails palliatively debrided secondary to pain  Procedure: Nail Debridement Rationale: Patient meets criteria for routine foot care due to pain Type of Debridement: manual, sharp debridement. Instrumentation: Nail nipper, rotary burr. Number of Nails: 10  No follow-ups on file.

## 2019-11-11 ENCOUNTER — Ambulatory Visit: Payer: Self-pay

## 2019-11-16 ENCOUNTER — Ambulatory Visit: Payer: Self-pay

## 2019-11-17 DIAGNOSIS — Z23 Encounter for immunization: Secondary | ICD-10-CM | POA: Diagnosis not present

## 2019-11-20 ENCOUNTER — Ambulatory Visit: Payer: Self-pay | Attending: Internal Medicine

## 2019-12-15 DIAGNOSIS — Z23 Encounter for immunization: Secondary | ICD-10-CM | POA: Diagnosis not present

## 2020-01-26 DIAGNOSIS — I10 Essential (primary) hypertension: Secondary | ICD-10-CM | POA: Diagnosis not present

## 2020-01-26 DIAGNOSIS — Z87891 Personal history of nicotine dependence: Secondary | ICD-10-CM | POA: Diagnosis not present

## 2020-01-26 DIAGNOSIS — N401 Enlarged prostate with lower urinary tract symptoms: Secondary | ICD-10-CM | POA: Diagnosis not present

## 2020-01-26 DIAGNOSIS — E119 Type 2 diabetes mellitus without complications: Secondary | ICD-10-CM | POA: Diagnosis not present

## 2020-01-26 DIAGNOSIS — Z952 Presence of prosthetic heart valve: Secondary | ICD-10-CM | POA: Diagnosis not present

## 2020-01-26 DIAGNOSIS — F988 Other specified behavioral and emotional disorders with onset usually occurring in childhood and adolescence: Secondary | ICD-10-CM | POA: Diagnosis not present

## 2020-01-26 DIAGNOSIS — R2689 Other abnormalities of gait and mobility: Secondary | ICD-10-CM | POA: Diagnosis not present

## 2020-01-26 DIAGNOSIS — Z7984 Long term (current) use of oral hypoglycemic drugs: Secondary | ICD-10-CM | POA: Diagnosis not present

## 2020-01-26 DIAGNOSIS — Z9181 History of falling: Secondary | ICD-10-CM | POA: Diagnosis not present

## 2020-01-26 DIAGNOSIS — F419 Anxiety disorder, unspecified: Secondary | ICD-10-CM | POA: Diagnosis not present

## 2020-01-26 DIAGNOSIS — Z7982 Long term (current) use of aspirin: Secondary | ICD-10-CM | POA: Diagnosis not present

## 2020-02-02 ENCOUNTER — Ambulatory Visit (INDEPENDENT_AMBULATORY_CARE_PROVIDER_SITE_OTHER): Payer: Medicare Other | Admitting: Podiatry

## 2020-02-02 ENCOUNTER — Telehealth: Payer: Self-pay | Admitting: Cardiology

## 2020-02-02 ENCOUNTER — Other Ambulatory Visit: Payer: Self-pay

## 2020-02-02 DIAGNOSIS — B351 Tinea unguium: Secondary | ICD-10-CM

## 2020-02-02 DIAGNOSIS — E1169 Type 2 diabetes mellitus with other specified complication: Secondary | ICD-10-CM

## 2020-02-02 DIAGNOSIS — E1142 Type 2 diabetes mellitus with diabetic polyneuropathy: Secondary | ICD-10-CM | POA: Diagnosis not present

## 2020-02-02 DIAGNOSIS — L84 Corns and callosities: Secondary | ICD-10-CM

## 2020-02-02 NOTE — Telephone Encounter (Signed)
New Message  Pt c/o swelling: STAT is pt has developed SOB within 24 hours  1) How much weight have you gained and in what time span? N/A  2) If swelling, where is the swelling located? Both feet   3) Are you currently taking a fluid pill? No  4) Are you currently SOB? No  5) Do you have a log of your daily weights (if so, list)? No  6) Have you gained 3 pounds in a day or 5 pounds in a week? N/A  7) Have you traveled recently? No

## 2020-02-02 NOTE — Telephone Encounter (Signed)
Called patient's son (DPR) back about his message. Patient's son stated patient's feet and ankles are exteremly swollen, and son states the right is more swollen then the left. Son used other words to tell how swollen the right foot is. Patient is not on any diuretics and eats a fairly healthy low salt diet. Will forward to Dr. Radford Pax and her nurse for advisement.

## 2020-02-03 MED ORDER — FUROSEMIDE 20 MG PO TABS: 20.0000 mg | ORAL_TABLET | Freq: Every day | ORAL | 3 refills | Status: DC

## 2020-02-03 NOTE — Telephone Encounter (Signed)
Start lasix 20mg  daily and use compression hose.  Elevate legs when sitting.  Check BMET in 1 week.  Call back if leg edema does not improve

## 2020-02-03 NOTE — Telephone Encounter (Signed)
Spoke with the patient's son and advised him of Dr. Theodosia Blender recommendations. The patient has been using compression stockings on most days. Will send in Rx for lasix 20 mg daily. Patient has an appt to see Sharrell Ku, PA-C on Monday 04/26.

## 2020-02-03 NOTE — Progress Notes (Signed)
Cardiology Office Note    Date:  02/06/2020   ID:  Angel Barton, DOB 02/12/1926, MRN [id number]  PCP:  Angel Bishop, DO  Cardiologist:  Fransico Him, MD  Electrophysiologist:  None   Chief Complaint: swelling  History of Present Illness:   Angel Barton is a 86 y.o. male with history of severe aortic stenosis s/p TAVR 2018 in [place], possible CKD, DM, GERD, HTN, hyperlipidemia, LBBB, ADD, anxiety, arthritis, BPH, depression, LVH, urinary retention who presents for evaluation of lower extremity edema. His chart carries history of CKD stage V although his last Cr in 2019 was 1.05 (had been as high in the 10 range in 08/2018, then 2.51, then 1.05). Last labs personally reviewed from 08/2018 Hgb 9.3, Cr 1.05, K 4.6. His labs are otherwise followed by the [facility] and are unavailable for review.  Last echo 02/2018 showed mild LVH, EF 55-60%, grade 1 DD, moderate LAE, s/p TAVR with normal valve function. Nuc from 09/2018 showed small scar in the apical inferior wall with no peri-infarct ischemia, normal EF, low risk study.  He is seen back for follow-up with his son-in-law. For several months he's noticed progressive bilateral lower extremity edema, worse in the last few weeks. The right was more swollen than the left. He had not had any SOB, orthopnea, known weight gain, or chest pain. His weight has been a moving target. At one point he was over 200lb, then 150s when he was ill a few years ago, now hovering 170s as his baseline. He has noticed his UOP has decreased. He is sedentary and keeps legs hanging down most of the time. Per phone note 4/22, he was advised to use compression hose and start Lasix 20mg  daily. He took a dose Saturday and Sunday which did facilitate good UOP. He has not yet seen an improvement in swelling. No recent injury, surgery, prolonged travel or bedrest. He does watch his salt intake. He drinks a lot of fluid daily, sodas and water.  Past Medical History:  Diagnosis Date    . ADD (attention deficit disorder)   . Anxiety   . Arthritis    left leg  . BPH (benign prostatic hyperplasia)   . CKD (chronic kidney disease), stage V ([facility]) 08/14/2018   pt unaware  . Depression   . Diabetes mellitus without complication ([place])   . Does use hearing aid   . GERD (gastroesophageal reflux disease)   . Grade I diastolic dysfunction 40/97/3532   Noted on ECHO  . Hyperlipidemia   . Hypertension   . Kidney stones   . LAE (left atrial enlargement) 02/15/2018   Moderate, Noted on ECHO  . Left bundle branch block 08/13/2018   Noted on EKG   . LVH (left ventricular hypertrophy) 02/15/2018   Mild, Noted on ECHO  . Nephrolithiasis 08/14/2018  . Nocturia   . Pedal edema 02/2018  . Urinary retention 08/14/2018    Past Surgical History:  Procedure Laterality Date  . AORTIC VALVE REPLACEMENT  06/26/2017  . JOINT REPLACEMENT    . right hip replacement  1994  . TRANSURETHRAL RESECTION OF PROSTATE N/A 09/20/2018   Procedure: TRANSURETHRAL RESECTION OF THE PROSTATE (TURP);  Surgeon: Lucas Mallow, MD;  Location: [facility];  Service: Urology;  Laterality: N/A;    Current Medications: Current Meds  Medication Sig  . atenolol (TENORMIN) 50 MG tablet Take 50 mg by mouth daily.  . Cyanocobalamin (VITAMIN B 12 PO) Take by mouth daily.  . ferrous  sulfate 325 (65 FE) MG tablet Take 325 mg by mouth daily with breakfast.  . furosemide (LASIX) 20 MG tablet Take 1 tablet (20 mg total) by mouth daily.  . metFORMIN (GLUCOPHAGE-XR) 500 MG 24 hr tablet Take 500 mg by mouth daily with breakfast.  . methylphenidate (RITALIN) 20 MG tablet Take 10 mg by mouth daily.   Marland Kitchen PARoxetine (PAXIL) 40 MG tablet Take 40 mg by mouth every morning.  . simvastatin (ZOCOR) 20 MG tablet Take 20 mg by mouth every morning.   . terazosin (HYTRIN) 10 MG capsule Take 10 mg by mouth at bedtime.  . vitamin B-12 (CYANOCOBALAMIN) 500 MCG tablet Take 500 mcg by mouth daily.     Allergies:   Patient has no  known allergies.   Social History   Socioeconomic History  . Marital status: Widowed    Spouse name: Not on file  . Number of children: Not on file  . Years of education: Not on file  . Highest education level: Not on file  Occupational History  . Not on file  Tobacco Use  . Smoking status: Former Research scientist (life sciences)  . Smokeless tobacco: Never Used  Substance and Sexual Activity  . Alcohol use: Never  . Drug use: Never  . Sexual activity: Not on file  Other Topics Concern  . Not on file  Social History Narrative  . Not on file   Social Determinants of Health   Financial Resource Strain:   . Difficulty of Paying Living Expenses:   Food Insecurity:   . Worried About Charity fundraiser in the Last Year:   . Arboriculturist in the Last Year:   Transportation Needs:   . Film/video editor (Medical):   Marland Kitchen Lack of Transportation (Non-Medical):   Physical Activity:   . Days of Exercise per Week:   . Minutes of Exercise per Session:   Stress:   . Feeling of Stress :   Social Connections:   . Frequency of Communication with Friends and Family:   . Frequency of Social Gatherings with Friends and Family:   . Attends Religious Services:   . Active Member of Clubs or Organizations:   . Attends Archivist Meetings:   Marland Kitchen Marital Status:      Family History:  The patient's family history includes Prostate cancer in his father.  ROS:   Please see the history of present illness.  All other systems are reviewed and otherwise negative.    EKGs/Labs/Other Studies Reviewed:    Studies reviewed are outlined and summarized above. Reports included below if pertinent.  2D Echo 02/2018 Study Conclusions   - Left ventricle: The cavity size was normal. Wall thickness was  increased in a pattern of mild LVH. Systolic function was normal.  The estimated ejection fraction was in the range of 55% to 60%.  Wall motion was normal; there were no regional wall motion   abnormalities. Doppler parameters are consistent with abnormal  left ventricular relaxation (grade 1 diastolic dysfunction).  - Mitral valve: Calcified annulus.  - Left atrium: The atrium was mildly to moderately dilated.   Impressions:   - Normal LVEF.  MIld LVH.  MOderate LAE.  S/P TAVR with normal valve function (Peak/Mean 16/8,,Hg).   NST 09/2018  Nuclear stress EF: 65%.  Defect 1: There is a small defect of severe severity present in the apical inferior location.  Findings consistent with prior myocardial infarction.  This is a low risk study.  The  left ventricular ejection fraction is normal (55-65%).   There is a small scar in the apical inferior wall with no peri-infarct ischemia. Normal LVEF. Low risk study.    EKG:  EKG is ordered today, personally reviewed, demonstrating NSR 62bpm LBBB nonspecific TW changes  Recent Labs: No results found for requested labs within last 8760 hours.  Recent Lipid Panel No results found for: CHOL, TRIG, HDL, CHOLHDL, VLDL, LDLCALC, LDLDIRECT  PHYSICAL EXAM:    VS:  BP 140/60   Pulse 62   Ht 5\' 7"  (1.702 m)   Wt 170 lb (77.1 kg)   SpO2 97%   BMI 26.63 kg/m   BMI: Body mass index is 26.63 kg/m.  GEN: Well nourished, well developed elderly WM, in no acute distress HEENT: normocephalic, atraumatic Neck: no JVD, carotid bruits, or masses Cardiac: RRR; soft SEM at RUSB, on rubs or gallops, 2+ pale pitting BLE edema with compression stockings on, no palpable cords or erythema, no weeping Respiratory:  clear to auscultation bilaterally, normal work of breathing GI: soft, nontender, nondistended, + BS MS: generalized atrophy noted Skin: warm and dry, no rash Neuro:  Alert and Oriented x 3 - He relies on son in law a lot for history, as he says he has difficulty processing words with mask on, No acute global abnormality, follows commands.  Psych: euthymic mood, full affect  Wt Readings from Last 3 Encounters:   02/06/20 170 lb (77.1 kg)  09/16/19 155 lb (70.3 kg)  03/22/19 149 lb (67.6 kg)     ASSESSMENT & PLAN:   1. Lower extremity edema - needs basic labs today, including to exclude any recurrent renal insufficiency - will get CMET to also include albumin. Will get TSH, BNP and CBC as well. Agree with continuing Lasix and compression hose for now (just started). Will update echocardiogram. I told them if his swelling does not begin to respond to the Lasix to call us at which time I would increase to 40mg  daily as long as renal function is not prohibitive. He has no palpable cords, erythema or pain suggestive of DVT. He is not tachycardic, tachypneic or SOB or hypoxic. However, if swelling fails to resolve with diuresis would consider evaluation for DVT. This seems less likely given chronicity and bilateral nature. Reviewed sodium restriction, 2L fluid restriction with patients. 2. Severe AS s/p TAVR - repeat echo was going to be pending for July 2021. However, given edema, will obtain sooner. Dr. Theodosia Blender last notes indicate for him to be on ASA 81mg  daily although it has not been on his med lists in recent visits. Son-in-law believes this was taken off by the [place] but unclear why. He does have hx of anemia so asked him to check with them, and resume if he has their blessing. 3. Essential HTN - BP upper normal today. Will follow with diuresis before making further changes. 4. [facility] - [facility]. No changes made in statin therapy today. 5. History of renal insufficiency - unclear baseline due to paucity of available labs in EMR. Prior admission in 2019 noted for ARF. Will assess today with labs.  Disposition: F/u with Dr. Betsy Pries team APP in 7-10 days (I am not in clinic in this timeframe).  Medication Adjustments/Labs and Tests Ordered: Current medicines are reviewed at length with the patient today.  Concerns regarding medicines are outlined above. Medication  changes, Labs and Tests ordered today are summarized above and listed in the Patient  Instructions accessible in Encounters.   Signed, Charlie Pitter, PA-C  02/06/2020 10:08 AM    [address], [place], [place]  [address] Phone: [phone]; Fax: [phone]

## 2020-02-05 NOTE — Progress Notes (Signed)
  Subjective:  Patient ID: Angel Barton, male    DOB: 1925-12-19,  MRN: [id number]  Chief Complaint  Patient presents with  . Nail Problem    Nail trim 1-5 bilateral  . Callouses    Right foot sub 5th painful callous  . Foot Problem    Right foot interdigital 4/5 "black stuff" unknown duration    86 y.o. male presents with the above complaint. History confirmed with patient.   Objective:  Physical Exam: warm, good capillary refill, nail exam onychomycosis of the toenails and pain to palpation, no trophic changes or ulcerative lesions, normal DP and PT pulses, and absent protective sensation. HPK right 5th met plantar  No images are attached to the encounter.  Assessment:   1. Onychomycosis of multiple toenails with type 2 diabetes mellitus and peripheral neuropathy ([place])   2. Callus of foot     Plan:  Patient was evaluated and treated and all questions answered.  Diabetes with DPN, Onychomycosis -Educated on diabetic footcare. Diabetic risk level 1 -Nails x10 debrided sharply and manually with large nail nipper and rotary burr. -Callus pared right 5th MPJ   Procedure: Nail Debridement Rationale: Patient meets criteria for routine foot care due to DPN Type of Debridement: manual, sharp debridement. Instrumentation: Nail nipper, rotary burr. Number of Nails: 10   Procedure: Paring of Lesion Rationale: painful hyperkeratotic lesion Type of Debridement: manual, sharp debridement. Instrumentation: 312 blade Number of Lesions: 1       No follow-ups on file.

## 2020-02-06 ENCOUNTER — Other Ambulatory Visit: Payer: Self-pay

## 2020-02-06 ENCOUNTER — Ambulatory Visit (INDEPENDENT_AMBULATORY_CARE_PROVIDER_SITE_OTHER): Payer: Medicare Other | Admitting: Physician Assistant

## 2020-02-06 ENCOUNTER — Encounter: Payer: Self-pay | Admitting: Physician Assistant

## 2020-02-06 VITALS — BP 140/60 | HR 62 | Ht 67.0 in | Wt 170.0 lb

## 2020-02-06 DIAGNOSIS — I10 Essential (primary) hypertension: Secondary | ICD-10-CM | POA: Diagnosis not present

## 2020-02-06 DIAGNOSIS — R600 Localized edema: Secondary | ICD-10-CM

## 2020-02-06 DIAGNOSIS — Z87448 Personal history of other diseases of urinary system: Secondary | ICD-10-CM

## 2020-02-06 DIAGNOSIS — E785 Hyperlipidemia, unspecified: Secondary | ICD-10-CM | POA: Diagnosis not present

## 2020-02-06 DIAGNOSIS — Z952 Presence of prosthetic heart valve: Secondary | ICD-10-CM

## 2020-02-06 LAB — COMPREHENSIVE METABOLIC PANEL
ALT: 10 IU/L (ref 0–44)
AST: 14 IU/L (ref 0–40)
Albumin/Globulin Ratio: 2.1 (ref 1.2–2.2)
Albumin: 4.2 g/dL (ref 3.5–4.6)
Alkaline Phosphatase: 87 IU/L (ref 39–117)
BUN/Creatinine Ratio: 26 — ABNORMAL HIGH (ref 10–24)
BUN: 24 mg/dL (ref 10–36)
Bilirubin Total: 0.6 mg/dL (ref 0.0–1.2)
CO2: 29 mmol/L (ref 20–29)
Calcium: 9.3 mg/dL (ref 8.6–10.2)
Chloride: 106 mmol/L (ref 96–106)
Creatinine, Ser: 0.91 mg/dL (ref 0.76–1.27)
GFR calc Af Amer: 84 mL/min/{1.73_m2} (ref >59)
GFR calc non Af Amer: 73 mL/min/{1.73_m2} (ref >59)
Globulin, Total: 2 g/dL (ref 1.5–4.5)
Glucose: 136 mg/dL — ABNORMAL HIGH (ref 65–99)
Potassium: 4.5 mmol/L (ref 3.5–5.2)
Sodium: 144 mmol/L (ref 134–144)
Total Protein: 6.2 g/dL (ref 6.0–8.5)

## 2020-02-06 LAB — CBC
Hematocrit: 33.5 % — ABNORMAL LOW (ref 37.5–51.0)
Hemoglobin: 10.8 g/dL — ABNORMAL LOW (ref 13.0–17.7)
MCH: 28.5 pg (ref 26.6–33.0)
MCHC: 32.2 g/dL (ref 31.5–35.7)
MCV: 88 fL (ref 79–97)
Platelets: 189 10*3/uL (ref 150–450)
RBC: 3.79 x10E6/uL — ABNORMAL LOW (ref 4.14–5.80)
RDW: 14.6 % (ref 11.6–15.4)
WBC: 6.7 10*3/uL (ref 3.4–10.8)

## 2020-02-06 NOTE — Patient Instructions (Addendum)
Medication Instructions:  Your physician recommends that you continue on your current medications as directed. Please refer to the Current Medication list given to you today.  *If you need a refill on your cardiac medications before your next appointment, please call your pharmacy*   Lab Work: TODAY:  STAT CMET, PRO BNP, CBC, & TSH  If you have labs (blood work) drawn today and your tests are completely normal, you will receive your results only by: Marland Kitchen MyChart Message (if you have MyChart) OR . A paper copy in the mail If you have any lab test that is abnormal or we need to change your treatment, we will call you to review the results.   Testing/Procedures: Your physician has requested that you have an echocardiogram. Echocardiography is a painless test that uses sound waves to create images of your heart. It provides your doctor with information about the size and shape of your heart and how well your heart's chambers and valves are working. This procedure takes approximately one hour. There are no restrictions for this procedure.     Follow-Up: At [facility], you and your health needs are our priority.  As part of our continuing mission to provide you with exceptional heart care, we have created designated Provider Care Teams.  These Care Teams include your primary Cardiologist (physician) and Advanced Practice Providers (APPs -  Physician Assistants and Nurse Practitioners) who all work together to provide you with the care you need, when you need it.  We recommend signing up for the patient portal called "MyChart".  Sign up information is provided on this After Visit Summary.  MyChart is used to connect with patients for Virtual Visits (Telemedicine).  Patients are able to view lab/test results, encounter notes, upcoming appointments, etc.  Non-urgent messages can be sent to your provider as well.   To learn more about what you can do with MyChart, go to [email or web address].     Your next appointment:   7-10 Days  02/15/2020 ARRIVE AT 1:30 FOR REGISTRATION   The format for your next appointment:   In Person  Provider:   Ermalinda Barrios, PA-C   Other Instructions  Echocardiogram An echocardiogram is a procedure that uses painless sound waves (ultrasound) to produce an image of the heart. Images from an echocardiogram can provide important information about:  Signs of coronary artery disease (CAD).  Aneurysm detection. An aneurysm is a weak or damaged part of an artery wall that bulges out from the normal force of blood pumping through the body.  Heart size and shape. Changes in the size or shape of the heart can be associated with certain conditions, including heart failure, aneurysm, and CAD.  Heart muscle function.  Heart valve function.  Signs of a past heart attack.  Fluid buildup around the heart.  Thickening of the heart muscle.  A tumor or infectious growth around the heart valves. Tell a health care provider about:  Any allergies you have.  All medicines you are taking, including vitamins, herbs, eye drops, creams, and over-the-counter medicines.  Any blood disorders you have.  Any surgeries you have had.  Any medical conditions you have.  Whether you are pregnant or may be pregnant. What are the risks? Generally, this is a safe procedure. However, problems may occur, including:  Allergic reaction to dye (contrast) that may be used during the procedure. What happens before the procedure? No specific preparation is needed. You may eat and drink normally. What happens during the procedure?  An IV tube may be inserted into one of your veins.  You may receive contrast through this tube. A contrast is an injection that improves the quality of the pictures from your heart.  A gel will be applied to your chest.  A wand-like tool (transducer) will be moved over your chest. The gel will help to transmit the sound waves from the  transducer.  The sound waves will harmlessly bounce off of your heart to allow the heart images to be captured in real-time motion. The images will be recorded on a computer. The procedure may vary among health care providers and hospitals. What happens after the procedure?  You may return to your normal, everyday life, including diet, activities, and medicines, unless your health care provider tells you not to do that. Summary  An echocardiogram is a procedure that uses painless sound waves (ultrasound) to produce an image of the heart.  Images from an echocardiogram can provide important information about the size and shape of your heart, heart muscle function, heart valve function, and fluid buildup around your heart.  You do not need to do anything to prepare before this procedure. You may eat and drink normally.  After the echocardiogram is completed, you may return to your normal, everyday life, unless your health care provider tells you not to do that. This information is not intended to replace advice given to you by your health care provider. Make sure you discuss any questions you have with your health care provider. Document Revised: 01/20/2019 Document Reviewed: 11/01/2016 Elsevier Patient Education  [employer].

## 2020-02-07 LAB — TSH: TSH: 1.89 u[IU]/mL (ref 0.450–4.500)

## 2020-02-07 LAB — PRO B NATRIURETIC PEPTIDE: NT-Pro BNP: 951 pg/mL — ABNORMAL HIGH (ref 0–486)

## 2020-02-08 ENCOUNTER — Telehealth: Payer: Self-pay | Admitting: *Deleted

## 2020-02-08 NOTE — Telephone Encounter (Signed)
      I went in pt's chart 2 times to find out who called him today.(02-08-20)

## 2020-02-10 ENCOUNTER — Other Ambulatory Visit: Payer: Medicare Other | Admitting: Orthotics

## 2020-02-10 ENCOUNTER — Other Ambulatory Visit: Payer: Self-pay

## 2020-02-10 ENCOUNTER — Ambulatory Visit: Payer: Medicare Other | Admitting: Podiatry

## 2020-02-13 ENCOUNTER — Ambulatory Visit (HOSPITAL_COMMUNITY): Payer: Medicare Other | Attending: Cardiology

## 2020-02-13 ENCOUNTER — Other Ambulatory Visit: Payer: Self-pay

## 2020-02-13 DIAGNOSIS — I350 Nonrheumatic aortic (valve) stenosis: Secondary | ICD-10-CM | POA: Diagnosis not present

## 2020-02-14 ENCOUNTER — Telehealth: Payer: Self-pay | Admitting: Physician Assistant

## 2020-02-14 NOTE — Telephone Encounter (Signed)
Patient's son wants to switch his dad's appt for tomorrow to a virutal appt.

## 2020-02-14 NOTE — Progress Notes (Signed)
Virtual Visit via Video Note   This visit type was conducted due to national recommendations for restrictions regarding the COVID-19 Pandemic (e.g. social distancing) in an effort to limit this patient's exposure and mitigate transmission in our community.  Due to his co-morbid illnesses, this patient is at least at moderate risk for complications without adequate follow up.  This format is felt to be most appropriate for this patient at this time.  All issues noted in this document were discussed and addressed.  A limited physical exam was performed with this format.  Please refer to the patient's chart for his consent to telehealth for [facility].   The patient was identified using 2 identifiers.  Date:  02/15/2020   ID:  Angel Barton, DOB October 17, 1925, MRN [id number]  Patient Location: Home Provider Location: Home  PCP:  Lyman Bishop, DO  Cardiologist:  Fransico Him, MD   Electrophysiologist:  None   Evaluation Performed:  Follow-Up Visit  Chief Complaint:  Follow up edema  History of Present Illness:    Angel Barton is a 86 y.o. male with history of severe aortic stenosis status post TAVR 2018 in [place], hypertension, HLD, LBBB, CKD.  Echo 02/2018 normal LVEF 55 to 60% with grade 1 DD and normal valve function.  NST 09/2018 small scar in the apical inferior wall no peri-infarct ischemia normal LVEF low risk study  Was seen in our office 02/06/2020 by Sharrell Ku, PA-C for lower extremity edema.  He was given Lasix 20 mg prior to that visit and had taken 2 doses-she continued Lasix 20 mg daily.  BNP was 951, creatinine 0.91, hemoglobin 10.8.  2D echo 02/13/20 normal LVEF 55 to 60% with grade 2 DD, mild mitral stenosis and stable TAVR.  Patient is now on Lasix 40 mg daily, watching sodium closely, wears compression socks almost daily. Still has some swelling. Denies chest pain, shortness of breath, dizziness or presyncope. Swelling down some but still has edema.   The patient  does not have symptoms concerning for COVID-19 infection (fever, chills, cough, or new shortness of breath).    Past Medical History:  Diagnosis Date  . ADD (attention deficit disorder)   . Anxiety   . Arthritis    left leg  . BPH (benign prostatic hyperplasia)   . CKD (chronic kidney disease), stage V ([place]) 08/14/2018   pt unaware  . Depression   . Diabetes mellitus without complication ([place])   . Does use hearing aid   . GERD (gastroesophageal reflux disease)   . Grade I diastolic dysfunction 43/15/4008   Noted on ECHO  . Hyperlipidemia   . Hypertension   . Kidney stones   . LAE (left atrial enlargement) 02/15/2018   Moderate, Noted on ECHO  . Left bundle branch block 08/13/2018   Noted on EKG   . LVH (left ventricular hypertrophy) 02/15/2018   Mild, Noted on ECHO  . Nephrolithiasis 08/14/2018  . Nocturia   . Pedal edema 02/2018  . Urinary retention 08/14/2018   Past Surgical History:  Procedure Laterality Date  . AORTIC VALVE REPLACEMENT  06/26/2017  . JOINT REPLACEMENT    . right hip replacement  1994  . TRANSURETHRAL RESECTION OF PROSTATE N/A 09/20/2018   Procedure: TRANSURETHRAL RESECTION OF THE PROSTATE (TURP);  Surgeon: Lucas Mallow, MD;  Location: [facility];  Service: Urology;  Laterality: N/A;     Current Meds  Medication Sig  . atenolol (TENORMIN) 50 MG tablet Take 50 mg by mouth  daily.  . Cyanocobalamin (VITAMIN B 12 PO) Take by mouth daily.  . ferrous sulfate 325 (65 FE) MG tablet Take 325 mg by mouth daily with breakfast.  . furosemide (LASIX) 40 MG tablet Take 40 mg by mouth.  . metFORMIN (GLUCOPHAGE-XR) 500 MG 24 hr tablet Take 500 mg by mouth daily with breakfast.  . methylphenidate (RITALIN) 20 MG tablet Take 10 mg by mouth daily.   . simvastatin (ZOCOR) 20 MG tablet Take 20 mg by mouth every morning.   . terazosin (HYTRIN) 10 MG capsule Take 10 mg by mouth at bedtime.  . vitamin B-12 (CYANOCOBALAMIN) 500 MCG tablet Take 500 mcg by mouth daily.      Allergies:   Patient has no known allergies.   Social History   Tobacco Use  . Smoking status: Former Research scientist (life sciences)  . Smokeless tobacco: Never Used  Substance Use Topics  . Alcohol use: Never  . Drug use: Never     Family Hx: The patient's family history includes Prostate cancer in his father.  ROS:   Please see the history of present illness.      All other systems reviewed and are negative.   Prior CV studies:   The following studies were reviewed today:  2D echo 4/2021IMPRESSIONS     1. Left ventricular ejection fraction, by estimation, is 55 to 60%. The  left ventricle has normal function. The left ventricle has no regional  wall motion abnormalities. There is mild left ventricular hypertrophy.  Left ventricular diastolic parameters  are consistent with Grade II diastolic dysfunction (pseudonormalization).   2. Right ventricular systolic function is normal. The right ventricular  size is normal.   3. Left atrial size was mildly dilated.   4. Right atrial size was mildly dilated.   5. The mitral valve and annulus were calcified. Trivial mitral valve  regurgitation. Mild mitral stenosis. Mean gradient 6 mmHg, MVA by PHT 2.12  cm^2.   6. Bioprosthetic aortic valve s/p TAVR. Mean gradient 8 mmHg, no  significant stenosis. I was unable to visualize aortic insufficiency.  Procedure Date: 2018.   7. IVC not visualized, peak RV-RA gradient 27 mmHg.   Comparison(s): 02/15/18 EF 55-60%. AV 31mmHg mean PG, 64mmHg peak PG.      2D Echo 02/2018 Study Conclusions    - Left ventricle: The cavity size was normal. Wall thickness was    increased in a pattern of mild LVH. Systolic function was normal.    The estimated ejection fraction was in the range of 55% to 60%.    Wall motion was normal; there were no regional wall motion    abnormalities. Doppler parameters are consistent with abnormal    left ventricular relaxation (grade 1 diastolic dysfunction).  - Mitral valve:  Calcified annulus.  - Left atrium: The atrium was mildly to moderately dilated.    Impressions:    - Normal LVEF.    MIld LVH.    MOderate LAE.    S/P TAVR with normal valve function (Peak/Mean 16/8,,Hg).    NST 09/2018            Nuclear stress EF: 65%.            Defect 1: There is a small defect of severe severity present in the apical inferior location.            Findings consistent with prior myocardial infarction.            This is a low risk study.  The left ventricular ejection fraction is normal (55-65%).   There is a small scar in the apical inferior wall with no peri-infarct ischemia. Normal LVEF. Low risk study.         Labs/Other Tests and Data Reviewed:    EKG:  No ECG reviewed.  Recent Labs: 02/06/2020: ALT 10; BUN 24; Creatinine, Ser 0.91; Hemoglobin 10.8; NT-Pro BNP 951; Platelets 189; Potassium 4.5; Sodium 144; TSH 1.890   Recent Lipid Panel No results found for: CHOL, TRIG, HDL, CHOLHDL, LDLCALC, LDLDIRECT  Wt Readings from Last 3 Encounters:  02/15/20 166 lb (75.3 kg)  02/06/20 170 lb (77.1 kg)  09/16/19 155 lb (70.3 kg)     Objective:    Vital Signs:  BP (!) 146/65   Pulse 61   Ht 5\' 7"  (1.702 m)   Wt 166 lb (75.3 kg)   SpO2 98%   BMI 26.00 kg/m    VITAL SIGNS:  reviewed GEN:  no acute distress RESPIRATORY:  normal respiratory effort, symmetric expansion CARDIOVASCULAR:  lower extremity edema noted  ASSESSMENT & PLAN:    Chronic diastolic CHF 2D echo normal LVEF with grade 2 DD responded to Lasix to a degree but still has some edema. Watching sodium pretty closely. Will increase lasix 60 mg daily for 2 days then back to 40 mg daily  History of severe aortic stenosis status post TAVR in 2018 in [place] recent echo stable valve  Essential hypertension BP up a bit, hopefully will come down with diuresis  CKD Crt normal but increased lasix so will have [facility] draw bmet and bnp  LBBB   COVID-19  Education: The signs and symptoms of COVID-19 were discussed with the patient and how to seek care for testing (follow up with PCP or arrange E-visit).   The importance of social distancing was discussed today.  Time:   Today, I have spent 15 minutes with the patient with telehealth technology discussing the above problems.     Medication Adjustments/Labs and Tests Ordered: Current medicines are reviewed at length with the patient today.  Concerns regarding medicines are outlined above.   Tests Ordered: No orders of the defined types were placed in this encounter.   Medication Changes: No orders of the defined types were placed in this encounter.   Follow Up:  Virtual Visit  in 1 week(s)   Signed, Ermalinda Barrios, PA-C  02/15/2020 2:11 PM    [place]

## 2020-02-14 NOTE — Telephone Encounter (Signed)
Pts son called wanting his fathers appointment for tomorrow to be switched to virtual due to mobility issues.

## 2020-02-15 ENCOUNTER — Telehealth: Payer: Self-pay

## 2020-02-15 ENCOUNTER — Encounter: Payer: Self-pay | Admitting: Physician Assistant

## 2020-02-15 ENCOUNTER — Telehealth (INDEPENDENT_AMBULATORY_CARE_PROVIDER_SITE_OTHER): Payer: Medicare Other | Admitting: Physician Assistant

## 2020-02-15 VITALS — BP 146/65 | HR 61 | Ht 67.0 in | Wt 166.0 lb

## 2020-02-15 DIAGNOSIS — I447 Left bundle-branch block, unspecified: Secondary | ICD-10-CM

## 2020-02-15 DIAGNOSIS — N183 Chronic kidney disease, stage 3 unspecified: Secondary | ICD-10-CM

## 2020-02-15 DIAGNOSIS — I5033 Acute on chronic diastolic (congestive) heart failure: Secondary | ICD-10-CM | POA: Diagnosis not present

## 2020-02-15 DIAGNOSIS — I10 Essential (primary) hypertension: Secondary | ICD-10-CM

## 2020-02-15 DIAGNOSIS — Z952 Presence of prosthetic heart valve: Secondary | ICD-10-CM | POA: Diagnosis not present

## 2020-02-15 MED ORDER — POTASSIUM CHLORIDE CRYS ER 20 MEQ PO TBCR
20.0000 meq | EXTENDED_RELEASE_TABLET | Freq: Every day | ORAL | 3 refills | Status: DC
Start: 2020-02-15 — End: 2020-02-21

## 2020-02-15 MED ORDER — FUROSEMIDE 40 MG PO TABS: ORAL_TABLET | ORAL | 3 refills | Status: DC

## 2020-02-15 NOTE — Telephone Encounter (Signed)
  Patient Consent for Virtual Visit         [name] has provided verbal consent on 02/15/2020 for a virtual visit (video or telephone).   CONSENT FOR VIRTUAL VISIT FOR:  Angel Barton  By participating in this virtual visit I agree to the following:  I hereby voluntarily request, consent and authorize [facility] and its employed or contracted physicians, physician assistants, nurse practitioners or other licensed health care professionals (the Practitioner), to provide me with telemedicine health care services (the "Services") as deemed necessary by the treating Practitioner. I acknowledge and consent to receive the Services by the Practitioner via telemedicine. I understand that the telemedicine visit will involve communicating with the Practitioner through live audiovisual communication technology and the disclosure of certain medical information by electronic transmission. I acknowledge that I have been given the opportunity to request an in-person assessment or other available alternative prior to the telemedicine visit and am voluntarily participating in the telemedicine visit.  I understand that I have the right to withhold or withdraw my consent to the use of telemedicine in the course of my care at any time, without affecting my right to future care or treatment, and that the Practitioner or I may terminate the telemedicine visit at any time. I understand that I have the right to inspect all information obtained and/or recorded in the course of the telemedicine visit and may receive copies of available information for a reasonable fee.  I understand that some of the potential risks of receiving the Services via telemedicine include:  Marland Kitchen Delay or interruption in medical evaluation due to technological equipment failure or disruption; . Information transmitted may not be sufficient (e.g. poor resolution of images) to allow for appropriate medical decision making by the Practitioner;  and/or  . In rare instances, security protocols could fail, causing a breach of personal health information.  Furthermore, I acknowledge that it is my responsibility to provide information about my medical history, conditions and care that is complete and accurate to the best of my ability. I acknowledge that Practitioner's advice, recommendations, and/or decision may be based on factors not within their control, such as incomplete or inaccurate data provided by me or distortions of diagnostic images or specimens that may result from electronic transmissions. I understand that the practice of medicine is not an exact science and that Practitioner makes no warranties or guarantees regarding treatment outcomes. I acknowledge that a copy of this consent can be made available to me via my patient portal ([place]), or I can request a printed copy by calling the office of [facility].    I understand that my insurance will be billed for this visit.   I have read or had this consent read to me. . I understand the contents of this consent, which adequately explains the benefits and risks of the Services being provided via telemedicine.  . I have been provided ample opportunity to ask questions regarding this consent and the Services and have had my questions answered to my satisfaction. . I give my informed consent for the services to be provided through the use of telemedicine in my medical care

## 2020-02-15 NOTE — Progress Notes (Signed)
Virtual Visit via Telephone Note   This visit type was conducted due to national recommendations for restrictions regarding the COVID-19 Pandemic (e.g. social distancing) in an effort to limit this patient's exposure and mitigate transmission in our community.  Due to his co-morbid illnesses, this patient is at least at moderate risk for complications without adequate follow up.  This format is felt to be most appropriate for this patient at this time.  The patient did not have access to video technology/had technical difficulties with video requiring transitioning to audio format only (telephone).  All issues noted in this document were discussed and addressed.  No physical exam could be performed with this format.  Please refer to the patient's chart for his  consent to telehealth for [facility].   The patient was identified using 2 identifiers.  Date:  02/21/2020   ID:  Angel Barton, DOB 11/25/1925, MRN [id number]  Patient Location: Home Provider Location: Home  PCP:  Lyman Bishop, DO  Cardiologist:  Fransico Him, MD   Electrophysiologist:  None   Evaluation Performed:  Follow-Up Visit  Chief Complaint:  Follow up  History of Present Illness:    Angel Barton is a 86 y.o. male with history of severe aortic stenosis status post TAVR 2018 in [place], hypertension, HLD, LBBB, CKD.  Echo 02/2018 normal LVEF 55 to 60% with grade 1 DD and normal valve function.  NST 09/2018 small scar in the apical inferior wall no peri-infarct ischemia normal LVEF low risk study   Was seen in our office 02/06/2020 by Melina Copa, PA-C for lower extremity edema.  He was given Lasix 20 mg prior to that visit and had taken 2 doses-she continued Lasix 20 mg daily.  BNP was 951, creatinine 0.91, hemoglobin 10.8.  2D echo 02/13/20 normal LVEF 55 to 60% with grade 2 DD, mild mitral stenosis and stable TAVR.   I saw the patient 02/15/2020 and was still having edema on Lasix 40 mg daily.  They are watching his  sodium intake much more closely.  I asked him to wear his compression stockings daily, 2 g sodium diet and increase Lasix to 60 mg daily for 2 days then back to 40 mg daily.  Home health to draw be met and BNP.  Telemedicine visit with son and patient and wife. Legs improved with lasix 60 mg but once back on 40 mg swelling worsened, right greater than left. No redness or pain in right leg. Son will send a picture through mychart. Also had blood work on Friday but not in system.   The patient does not have symptoms concerning for COVID-19 infection (fever, chills, cough, or new shortness of breath).    Past Medical History:  Diagnosis Date  . ADD (attention deficit disorder)   . Anxiety   . Arthritis    left leg  . BPH (benign prostatic hyperplasia)   . CKD (chronic kidney disease), stage V ([place]) 08/14/2018   pt unaware  . Depression   . Diabetes mellitus without complication ([place])   . Does use hearing aid   . GERD (gastroesophageal reflux disease)   . Grade I diastolic dysfunction 56/21/3086   Noted on ECHO  . Hyperlipidemia   . Hypertension   . Kidney stones   . LAE (left atrial enlargement) 02/15/2018   Moderate, Noted on ECHO  . Left bundle branch block 08/13/2018   Noted on EKG   . LVH (left ventricular hypertrophy) 02/15/2018   Mild, Noted on ECHO  .  Nephrolithiasis 08/14/2018  . Nocturia   . Pedal edema 02/2018  . Urinary retention 08/14/2018   Past Surgical History:  Procedure Laterality Date  . AORTIC VALVE REPLACEMENT  06/26/2017  . JOINT REPLACEMENT    . right hip replacement  1994  . TRANSURETHRAL RESECTION OF PROSTATE N/A 09/20/2018   Procedure: TRANSURETHRAL RESECTION OF THE PROSTATE (TURP);  Surgeon: Lucas Mallow, MD;  Location: [facility];  Service: Urology;  Laterality: N/A;     Current Meds  Medication Sig  . atenolol (TENORMIN) 50 MG tablet Take 50 mg by mouth daily.  . Cyanocobalamin (VITAMIN B 12 PO) Take by mouth daily.  . ferrous sulfate 325  (65 FE) MG tablet Take 325 mg by mouth daily with breakfast.  . furosemide (LASIX) 40 MG tablet Take 1.5 tablets (60 mg total) by mouth daily.  . metFORMIN (GLUCOPHAGE-XR) 500 MG 24 hr tablet Take 500 mg by mouth daily with breakfast.  . methylphenidate (RITALIN) 20 MG tablet Take 10 mg by mouth daily.   . potassium chloride SA (KLOR-CON) 20 MEQ tablet Take 1.5 tablets (30 mEq total) by mouth daily.  . simvastatin (ZOCOR) 20 MG tablet Take 20 mg by mouth every morning.   . terazosin (HYTRIN) 10 MG capsule Take 10 mg by mouth at bedtime.  . vitamin B-12 (CYANOCOBALAMIN) 500 MCG tablet Take 500 mcg by mouth daily.  . [DISCONTINUED] furosemide (LASIX) 40 MG tablet Take 1.5 tablets (60 mg) once a day for 2 days, then decrease to taking 1 tablet (40 mg) once a day  . [DISCONTINUED] potassium chloride SA (KLOR-CON) 20 MEQ tablet Take 1 tablet (20 mEq total) by mouth daily.     Allergies:   Patient has no known allergies.   Social History   Tobacco Use  . Smoking status: Former Research scientist (life sciences)  . Smokeless tobacco: Never Used  Substance Use Topics  . Alcohol use: Never  . Drug use: Never     Family Hx: The patient's family history includes Prostate cancer in his father.  ROS:   Please see the history of present illness.     All other systems reviewed and are negative.   Prior CV studies:   The following studies were reviewed today:  with history of severe aortic stenosis status post TAVR 2018 in [place], hypertension, HLD, LBBB, CKD.  Echo 02/2018 normal LVEF 55 to 60% with grade 1 DD and normal valve function.  NST 09/2018 small scar in the apical inferior wall no peri-infarct ischemia normal LVEF low risk study   Was seen in our office 02/06/2020 by Sharrell Ku, PA-C for lower extremity edema.  He was given Lasix 20 mg prior to that visit and had taken 2 doses-she continued Lasix 20 mg daily.  BNP was 951, creatinine 0.91, hemoglobin 10.8.  2D echo 02/13/20 normal LVEF 55 to 60% with grade 2 DD,  mild mitral stenosis and stable TAVR.   2D echo 4/2021IMPRESSIONS     1. Left ventricular ejection fraction, by estimation, is 55 to 60%. The  left ventricle has normal function. The left ventricle has no regional  wall motion abnormalities. There is mild left ventricular hypertrophy.  Left ventricular diastolic parameters  are consistent with Grade II diastolic dysfunction (pseudonormalization).   2. Right ventricular systolic function is normal. The right ventricular  size is normal.   3. Left atrial size was mildly dilated.   4. Right atrial size was mildly dilated.   5. The mitral valve and annulus were  calcified. Trivial mitral valve  regurgitation. Mild mitral stenosis. Mean gradient 6 mmHg, MVA by PHT 2.12  cm^2.   6. Bioprosthetic aortic valve s/p TAVR. Mean gradient 8 mmHg, no  significant stenosis. I was unable to visualize aortic insufficiency.  Procedure Date: 2018.   7. IVC not visualized, peak RV-RA gradient 27 mmHg.   Comparison(s): 02/15/18 EF 55-60%. AV 110mHg mean PG, 12mg peak PG.      2D Echo 02/2018 Study Conclusions    - Left ventricle: The cavity size was normal. Wall thickness was    increased in a pattern of mild LVH. Systolic function was normal.    The estimated ejection fraction was in the range of 55% to 60%.    Wall motion was normal; there were no regional wall motion    abnormalities. Doppler parameters are consistent with abnormal    left ventricular relaxation (grade 1 diastolic dysfunction).  - Mitral valve: Calcified annulus.  - Left atrium: The atrium was mildly to moderately dilated.    Impressions:    - Normal LVEF.    MIld LVH.    MOderate LAE.    S/P TAVR with normal valve function (Peak/Mean 16/8,,Hg).    NST 09/2018            Nuclear stress EF: 65%.            Defect 1: There is a small defect of severe severity present in the apical inferior location.            Findings consistent with prior myocardial infarction.             This is a low risk study.            The left ventricular ejection fraction is normal (55-65%).   There is a small scar in the apical inferior wall with no peri-infarct ischemia. Normal LVEF. Low risk study.         Labs/Other Tests and Data Reviewed:    EKG:  No ECG reviewed.  Recent Labs: 02/06/2020: ALT 10; BUN 24; Creatinine, Ser 0.91; Hemoglobin 10.8; NT-Pro BNP 951; Platelets 189; Potassium 4.5; Sodium 144; TSH 1.890   Recent Lipid Panel No results found for: CHOL, TRIG, HDL, CHOLHDL, LDLCALC, LDLDIRECT  Wt Readings from Last 3 Encounters:  02/21/20 166 lb (75.3 kg)  02/15/20 166 lb (75.3 kg)  02/06/20 170 lb (77.1 kg)     Objective:    Vital Signs:  BP 132/69   Ht _0  (1.702 m)   Wt 166 lb (75.3 kg)   SpO2 98%   BMI 26.00 kg/m    VITAL SIGNS:  reviewed  ASSESSMENT & PLAN:    Acute on Chronic diastolic CHF 2D echo normal LVEF with grade 2 DD responded to Lasix to a degree but still has some edema. Watching sodium pretty closely. I increased lasix 60 mg daily for 2 days then back to 40 mg daily- still having some edema. He is very unsteady so trouble getting weights. Will increase lasix to 60 mg daily and Kdur 20 meq 1 1/2 daily. Keep legs elevated. Wearing compression hose.   [place] of [place]   Essential hypertension BP up a bit, hopefully will come down with diuresis   CKD Crt  Had blood work done on Friday by [facility] but not in the system-will    LBBB       COVID-19 Education: The signs  and symptoms of COVID-19 were discussed with the patient and how to seek care for testing (follow up with PCP or arrange E-visit).   The importance of social distancing was discussed today.  Time:   Today, I have spent 14 minutes with the patient with telehealth technology discussing the above problems.     Medication Adjustments/Labs and Tests Ordered: Current medicines are  reviewed at length with the patient today.  Concerns regarding medicines are outlined above.   Tests Ordered: No orders of the defined types were placed in this encounter.   Medication Changes: Meds ordered this encounter  Medications  . furosemide (LASIX) 40 MG tablet    Sig: Take 1.5 tablets (60 mg total) by mouth daily.    Dispense:  135 tablet    Refill:  3    Dose increased  . potassium chloride SA (KLOR-CON) 20 MEQ tablet    Sig: Take 1.5 tablets (30 mEq total) by mouth daily.    Dispense:  135 tablet    Refill:  3    Dose increased    Follow Up:  Either In Person or Virtual in 2 week(s)  Signed, Ermalinda Barrios, PA-C  02/21/2020 12:14 PM    [facility]

## 2020-02-15 NOTE — Patient Instructions (Signed)
Medication Instructions:  Your physician has recommended you make the following change in your medication:   1. CHANGE: furosemide (lasix) 40 mg tablet: Take 1.5 tablets (60 mg total) by mouth once a day for 2 days and then decrease back to 1 tablet (40 mg) once a day  2. START: potassium chloride (k-dur) 20 mEq by mouth once a day  *If you need a refill on your cardiac medications before your next appointment, please call your pharmacy*   Lab Work: We will arrange for your [facility] to draw labs (BMET and BNP) this Friday  If you have labs (blood work) drawn today and your tests are completely normal, you will receive your results only by: Marland Kitchen MyChart Message (if you have MyChart) OR . A paper copy in the mail If you have any lab test that is abnormal or we need to change your treatment, we will call you to review the results.   Testing/Procedures: None ordered   Follow-Up: Follow up with Ermalinda Barrios, PA via VIDEO Visit on 02/21/20 at 11:45 AM   Other Instructions

## 2020-02-17 DIAGNOSIS — E119 Type 2 diabetes mellitus without complications: Secondary | ICD-10-CM | POA: Diagnosis not present

## 2020-02-17 DIAGNOSIS — N401 Enlarged prostate with lower urinary tract symptoms: Secondary | ICD-10-CM | POA: Diagnosis not present

## 2020-02-17 DIAGNOSIS — Z7984 Long term (current) use of oral hypoglycemic drugs: Secondary | ICD-10-CM | POA: Diagnosis not present

## 2020-02-21 ENCOUNTER — Telehealth (INDEPENDENT_AMBULATORY_CARE_PROVIDER_SITE_OTHER): Payer: Medicare Other | Admitting: Physician Assistant

## 2020-02-21 ENCOUNTER — Encounter: Payer: Self-pay | Admitting: Physician Assistant

## 2020-02-21 ENCOUNTER — Other Ambulatory Visit: Payer: Self-pay

## 2020-02-21 VITALS — BP 132/69 | Ht 67.0 in | Wt 166.0 lb

## 2020-02-21 DIAGNOSIS — Z953 Presence of xenogenic heart valve: Secondary | ICD-10-CM

## 2020-02-21 DIAGNOSIS — I10 Essential (primary) hypertension: Secondary | ICD-10-CM

## 2020-02-21 DIAGNOSIS — I5033 Acute on chronic diastolic (congestive) heart failure: Secondary | ICD-10-CM

## 2020-02-21 DIAGNOSIS — N183 Chronic kidney disease, stage 3 unspecified: Secondary | ICD-10-CM

## 2020-02-21 DIAGNOSIS — I447 Left bundle-branch block, unspecified: Secondary | ICD-10-CM

## 2020-02-21 MED ORDER — FUROSEMIDE 40 MG PO TABS: 60.0000 mg | ORAL_TABLET | Freq: Every day | ORAL | 3 refills | Status: DC

## 2020-02-21 MED ORDER — POTASSIUM CHLORIDE CRYS ER 20 MEQ PO TBCR: 30.0000 meq | EXTENDED_RELEASE_TABLET | Freq: Every day | ORAL | 3 refills | Status: AC

## 2020-02-21 NOTE — Patient Instructions (Signed)
Medication Instructions:  Your physician has recommended you make the following change in your medication:   1. INCREASE: furosemide (lasix) to 60 mg once a day  2. INCREASE: potassium chloride (k-dur) to 30 mEq once a day  *If you need a refill on your cardiac medications before your next appointment, please call your pharmacy*   Lab Work: We will arrange for [facility] to come out and draw labs (BMET and BNP)  If you have labs (blood work) drawn today and your tests are completely normal, you will receive your results only by: Marland Kitchen MyChart Message (if you have MyChart) OR . A paper copy in the mail If you have any lab test that is abnormal or we need to change your treatment, we will call you to review the results.   Testing/Procedures: None ordered   Follow-Up: Follow up with Ermalinda Barrios, PA via VIRTUAL Visit on 03/06/20 at 2:00 PM. Please let us know if you would like to switch this appointment to in person as we are in the office that day.  Other Instructions

## 2020-02-24 DIAGNOSIS — I5032 Chronic diastolic (congestive) heart failure: Secondary | ICD-10-CM | POA: Diagnosis not present

## 2020-02-24 DIAGNOSIS — I10 Essential (primary) hypertension: Secondary | ICD-10-CM | POA: Diagnosis not present

## 2020-02-25 DIAGNOSIS — I10 Essential (primary) hypertension: Secondary | ICD-10-CM | POA: Diagnosis not present

## 2020-02-28 NOTE — Progress Notes (Signed)
Virtual Visit via Telephone Note   This visit type was conducted due to national recommendations for restrictions regarding the COVID-19 Pandemic (e.g. social distancing) in an effort to limit this patient's exposure and mitigate transmission in our community.  Due to his co-morbid illnesses, this patient is at least at moderate risk for complications without adequate follow up.  This format is felt to be most appropriate for this patient at this time.  The patient did not have access to video technology/had technical difficulties with video requiring transitioning to audio format only (telephone).  All issues noted in this document were discussed and addressed.  No physical exam could be performed with this format.  Please refer to the patient's chart for his  consent to telehealth for [facility].   The patient was identified using 2 identifiers.  Date:  03/06/2020   ID:  Angel Barton, DOB 01/24/1926, MRN [id number]  Patient Location: Home Provider Location: Office  PCP:  Lyman Bishop, DO  Cardiologist:  Fransico Him, MD  Electrophysiologist:  None   Evaluation Performed:  Follow-Up Visit  Chief Complaint:  f/u  History of Present Illness:    Angel Barton is a 86 y.o. male with with history of severe aortic stenosis status post TAVR 2018 in [place], hypertension, HLD, LBBB, CKD.  Echo 02/2018 normal LVEF 55 to 60% with grade 1 DD and normal valve function.  NST 09/2018 small scar in the apical inferior wall no peri-infarct ischemia normal LVEF low risk study   Was seen in our office 02/06/2020 by Melina Copa, PA-C for lower extremity edema.  He was given Lasix 20 mg prior to that visit and had taken 2 doses-she continued Lasix 20 mg daily.  BNP was 951, creatinine 0.91, hemoglobin 10.8.  2D echo 02/13/20 normal LVEF 55 to 60% with grade 2 DD, mild mitral stenosis and stable TAVR.   I saw the patient 02/15/2020 and was still having edema on Lasix 40 mg daily.  They are watching his  sodium intake much more closely.  I asked him to wear his compression stockings daily, 2 g sodium diet and increase Lasix to 60 mg daily for 2 days then back to 40 mg daily.  Home health to draw bmet and BNP.   Telemedicine visit with patient 02/21/2020 legs were still swollen.  Patient was not keeping them elevated.  Son sent a picture of his legs through my chart and he had some edema but I did not think it was significant.  Telemedicine visit with the patient and his wife. Taking lasix 60 mg daily and swelling about the same. Support hose help some and trying to keep his legs elevated. Not short of breath    The patient does not have symptoms concerning for COVID-19 infection (fever, chills, cough, or new shortness of breath).    Past Medical History:  Diagnosis Date  . ADD (attention deficit disorder)   . Anxiety   . Arthritis    left leg  . BPH (benign prostatic hyperplasia)   . CKD (chronic kidney disease), stage V ([place]) 08/14/2018   pt unaware  . Depression   . Diabetes mellitus without complication ([place])   . Does use hearing aid   . GERD (gastroesophageal reflux disease)   . Grade I diastolic dysfunction 99/83/3825   Noted on ECHO  . Hyperlipidemia   . Hypertension   . Kidney stones   . LAE (left atrial enlargement) 02/15/2018   Moderate, Noted on ECHO  .  Left bundle branch block 08/13/2018   Noted on EKG   . LVH (left ventricular hypertrophy) 02/15/2018   Mild, Noted on ECHO  . Nephrolithiasis 08/14/2018  . Nocturia   . Pedal edema 02/2018  . Urinary retention 08/14/2018   Past Surgical History:  Procedure Laterality Date  . AORTIC VALVE REPLACEMENT  06/26/2017  . JOINT REPLACEMENT    . right hip replacement  1994  . TRANSURETHRAL RESECTION OF PROSTATE N/A 09/20/2018   Procedure: TRANSURETHRAL RESECTION OF THE PROSTATE (TURP);  Surgeon: Lucas Mallow, MD;  Location: [facility];  Service: Urology;  Laterality: N/A;     Current Meds  Medication Sig  . atenolol  (TENORMIN) 50 MG tablet Take 50 mg by mouth daily.  . Cyanocobalamin (VITAMIN B 12 PO) Take by mouth daily.  . ferrous sulfate 325 (65 FE) MG tablet Take 325 mg by mouth daily with breakfast.  . furosemide (LASIX) 40 MG tablet Take 1.5 tablets (60 mg total) by mouth daily.  . metFORMIN (GLUCOPHAGE-XR) 500 MG 24 hr tablet Take 500 mg by mouth daily with breakfast.  . methylphenidate (RITALIN) 20 MG tablet Take 10 mg by mouth daily.   . potassium chloride SA (KLOR-CON) 20 MEQ tablet Take 1.5 tablets (30 mEq total) by mouth daily.  . simvastatin (ZOCOR) 20 MG tablet Take 20 mg by mouth every morning.   . terazosin (HYTRIN) 10 MG capsule Take 10 mg by mouth at bedtime.  . vitamin B-12 (CYANOCOBALAMIN) 500 MCG tablet Take 500 mcg by mouth daily.     Allergies:   Patient has no known allergies.   Social History   Tobacco Use  . Smoking status: Former Research scientist (life sciences)  . Smokeless tobacco: Never Used  Substance Use Topics  . Alcohol use: Never  . Drug use: Never     Family Hx: The patient's family history includes Prostate cancer in his father.  ROS:   Please see the history of present illness.      All other systems reviewed and are negative.   Prior CV studies:   The following studies were reviewed today:    2D echo 5/3//2021IMPRESSIONS     1. Left ventricular ejection fraction, by estimation, is 55 to 60%. The  left ventricle has normal function. The left ventricle has no regional  wall motion abnormalities. There is mild left ventricular hypertrophy.  Left ventricular diastolic parameters  are consistent with Grade II diastolic dysfunction (pseudonormalization).   2. Right ventricular systolic function is normal. The right ventricular  size is normal.   3. Left atrial size was mildly dilated.   4. Right atrial size was mildly dilated.   5. The mitral valve and annulus were calcified. Trivial mitral valve  regurgitation. Mild mitral stenosis. Mean gradient 6 mmHg, MVA by PHT 2.12    cm^2.   6. Bioprosthetic aortic valve s/p TAVR. Mean gradient 8 mmHg, no  significant stenosis. I was unable to visualize aortic insufficiency.  Procedure Date: 2018.   7. IVC not visualized, peak RV-RA gradient 27 mmHg.   Comparison(s): 02/15/18 EF 55-60%. AV 60mmHg mean PG, 53mmHg peak PG.        2D Echo 02/2018 Study Conclusions    - Left ventricle: The cavity size was normal. Wall thickness was    increased in a pattern of mild LVH. Systolic function was normal.    The estimated ejection fraction was in the range of 55% to 60%.    Wall motion was normal; there were no  regional wall motion    abnormalities. Doppler parameters are consistent with abnormal    left ventricular relaxation (grade 1 diastolic dysfunction).  - Mitral valve: Calcified annulus.  - Left atrium: The atrium was mildly to moderately dilated.    Impressions:    - Normal LVEF.    MIld LVH.    MOderate LAE.    S/P TAVR with normal valve function (Peak/Mean 16/8,,Hg).    NST 09/2018            Nuclear stress EF: 65%.            Defect 1: There is a small defect of severe severity present in the apical inferior location.            Findings consistent with prior myocardial infarction.            This is a low risk study.            The left ventricular ejection fraction is normal (55-65%).   There is a small scar in the apical inferior wall with no peri-infarct ischemia. Normal LVEF. Low risk study.            Labs/Other Tests and Data Reviewed:    EKG:  No ECG reviewed.  Recent Labs: 02/06/2020: ALT 10; BUN 24; Creatinine, Ser 0.91; Hemoglobin 10.8; NT-Pro BNP 951; Platelets 189; Potassium 4.5; Sodium 144; TSH 1.890   Recent Lipid Panel No results found for: CHOL, TRIG, HDL, CHOLHDL, LDLCALC, LDLDIRECT  Wt Readings from Last 3 Encounters:  03/06/20 159 lb (72.1 kg)  02/21/20 166 lb (75.3 kg)  02/15/20 166 lb (75.3 kg)     Objective:    Vital Signs:  BP 130/60   Ht 5\' 7"  (1.702 m)    Wt 159 lb (72.1 kg)   SpO2 97%   BMI 24.90 kg/m    VITAL SIGNS:  reviewed  ASSESSMENT & PLAN:    Acute on Chronic diastolic BNP 235 3/61/4431 CHF 2D echo normal LVEF with grade 2 DD responded to Lasix to a degree but still has some edema. I increased lasix to 60 mg daily and Kdur 20 meq 1 1/2 daily. Keep legs elevated. Wearing compression hose. Still some swelling per wife but Weight down 11 lbs, BNP  down to 232. Continue same dose lasix. Recheck bmet in 2 weeks. F/u with Dr. Radford Pax in 2-3 months   History of severe aortic stenosis status post TAVR in 2018 in [place] recent echo 02/13/20 stable valve   Essential hypertension BP  Well controlled   Had AKI but  Crt  1.06  02/17/20   LBBB       COVID-19 Education: The signs and symptoms of COVID-19 were discussed with the patient and how to seek care for testing (follow up with PCP or arrange E-visit).   The importance of social distancing was discussed today.  Time:   Today, I have spent  11:10 minutes with the patient with telehealth technology discussing the above problems.     Medication Adjustments/Labs and Tests Ordered: Current medicines are reviewed at length with the patient today.  Concerns regarding medicines are outlined above.   Tests Ordered: No orders of the defined types were placed in this encounter.   Medication Changes: No orders of the defined types were placed in this encounter.   Follow Up:  In Person in 2 month(s) Dr. Radford Pax   Signed, Ermalinda Barrios, PA-C  03/06/2020 2:40 PM    [place]

## 2020-02-29 DIAGNOSIS — I5032 Chronic diastolic (congestive) heart failure: Secondary | ICD-10-CM | POA: Diagnosis not present

## 2020-02-29 DIAGNOSIS — I10 Essential (primary) hypertension: Secondary | ICD-10-CM | POA: Diagnosis not present

## 2020-02-29 DIAGNOSIS — E1122 Type 2 diabetes mellitus with diabetic chronic kidney disease: Secondary | ICD-10-CM | POA: Diagnosis not present

## 2020-02-29 DIAGNOSIS — I132 Hypertensive heart and chronic kidney disease with heart failure and with stage 5 chronic kidney disease, or end stage renal disease: Secondary | ICD-10-CM | POA: Diagnosis not present

## 2020-03-05 ENCOUNTER — Telehealth: Payer: Self-pay | Admitting: Podiatry

## 2020-03-05 NOTE — Telephone Encounter (Signed)
pts son left message stating he got my message and wanted me to see if pt had to come in to pick them up.   I returned call and told pts son that pt has to come in to see Liliane Channel to make sure shoes fit and he stated he will call back to schedule an appt asap.

## 2020-03-06 ENCOUNTER — Other Ambulatory Visit: Payer: Self-pay

## 2020-03-06 ENCOUNTER — Encounter: Payer: Self-pay | Admitting: Physician Assistant

## 2020-03-06 ENCOUNTER — Telehealth (INDEPENDENT_AMBULATORY_CARE_PROVIDER_SITE_OTHER): Payer: Medicare Other | Admitting: Physician Assistant

## 2020-03-06 VITALS — BP 130/60 | Ht 67.0 in | Wt 159.0 lb

## 2020-03-06 DIAGNOSIS — N289 Disorder of kidney and ureter, unspecified: Secondary | ICD-10-CM | POA: Diagnosis not present

## 2020-03-06 DIAGNOSIS — I350 Nonrheumatic aortic (valve) stenosis: Secondary | ICD-10-CM

## 2020-03-06 DIAGNOSIS — I5032 Chronic diastolic (congestive) heart failure: Secondary | ICD-10-CM

## 2020-03-06 DIAGNOSIS — I10 Essential (primary) hypertension: Secondary | ICD-10-CM | POA: Diagnosis not present

## 2020-03-06 DIAGNOSIS — I447 Left bundle-branch block, unspecified: Secondary | ICD-10-CM

## 2020-03-06 NOTE — Patient Instructions (Signed)
Medication Instructions:  Your physician recommends that you continue on your current medications as directed. Please refer to the Current Medication list given to you today.  *If you need a refill on your cardiac medications before your next appointment, please call your pharmacy*   Lab Work: We will arrange for [facility]  If you have labs (blood work) drawn today and your tests are completely normal, you will receive your results only by: Marland Kitchen MyChart Message (if you have MyChart) OR . A paper copy in the mail If you have any lab test that is abnormal or we need to change your treatment, we will call you to review the results.   Testing/Procedures: None ordered   Follow-Up: Follow up with Dr. Radford Pax on 06/04/20 at 11:00 AM   Other Instructions None

## 2020-03-17 ENCOUNTER — Emergency Department (HOSPITAL_COMMUNITY): Payer: Medicare Other

## 2020-03-17 ENCOUNTER — Emergency Department (HOSPITAL_BASED_OUTPATIENT_CLINIC_OR_DEPARTMENT_OTHER): Payer: Medicare Other

## 2020-03-17 ENCOUNTER — Observation Stay (HOSPITAL_COMMUNITY)
Admission: EM | Admit: 2020-03-17 | Discharge: 2020-03-18 | Disposition: A | Discharge status: Home / Self Care | Payer: Medicare Other | Attending: Internal Medicine | Admitting: Internal Medicine

## 2020-03-17 ENCOUNTER — Encounter (HOSPITAL_COMMUNITY): Payer: Self-pay | Admitting: *Deleted

## 2020-03-17 DIAGNOSIS — K219 Gastro-esophageal reflux disease without esophagitis: Secondary | ICD-10-CM | POA: Insufficient documentation

## 2020-03-17 DIAGNOSIS — R319 Hematuria, unspecified: Secondary | ICD-10-CM | POA: Diagnosis not present

## 2020-03-17 DIAGNOSIS — N401 Enlarged prostate with lower urinary tract symptoms: Secondary | ICD-10-CM | POA: Insufficient documentation

## 2020-03-17 DIAGNOSIS — I959 Hypotension, unspecified: Secondary | ICD-10-CM

## 2020-03-17 DIAGNOSIS — I132 Hypertensive heart and chronic kidney disease with heart failure and with stage 5 chronic kidney disease, or end stage renal disease: Secondary | ICD-10-CM | POA: Insufficient documentation

## 2020-03-17 DIAGNOSIS — N185 Chronic kidney disease, stage 5: Secondary | ICD-10-CM | POA: Insufficient documentation

## 2020-03-17 DIAGNOSIS — J9811 Atelectasis: Secondary | ICD-10-CM | POA: Diagnosis not present

## 2020-03-17 DIAGNOSIS — I509 Heart failure, unspecified: Secondary | ICD-10-CM | POA: Insufficient documentation

## 2020-03-17 DIAGNOSIS — Z96641 Presence of right artificial hip joint: Secondary | ICD-10-CM | POA: Insufficient documentation

## 2020-03-17 DIAGNOSIS — I447 Left bundle-branch block, unspecified: Secondary | ICD-10-CM | POA: Insufficient documentation

## 2020-03-17 DIAGNOSIS — I517 Cardiomegaly: Secondary | ICD-10-CM | POA: Diagnosis not present

## 2020-03-17 DIAGNOSIS — R29810 Facial weakness: Secondary | ICD-10-CM | POA: Diagnosis not present

## 2020-03-17 DIAGNOSIS — R338 Other retention of urine: Secondary | ICD-10-CM | POA: Diagnosis not present

## 2020-03-17 DIAGNOSIS — M1990 Unspecified osteoarthritis, unspecified site: Secondary | ICD-10-CM | POA: Insufficient documentation

## 2020-03-17 DIAGNOSIS — Z87891 Personal history of nicotine dependence: Secondary | ICD-10-CM | POA: Diagnosis not present

## 2020-03-17 DIAGNOSIS — F419 Anxiety disorder, unspecified: Secondary | ICD-10-CM | POA: Insufficient documentation

## 2020-03-17 DIAGNOSIS — R55 Syncope and collapse: Principal | ICD-10-CM | POA: Insufficient documentation

## 2020-03-17 DIAGNOSIS — Z952 Presence of prosthetic heart valve: Secondary | ICD-10-CM | POA: Insufficient documentation

## 2020-03-17 DIAGNOSIS — E1122 Type 2 diabetes mellitus with diabetic chronic kidney disease: Secondary | ICD-10-CM | POA: Insufficient documentation

## 2020-03-17 DIAGNOSIS — F988 Other specified behavioral and emotional disorders with onset usually occurring in childhood and adolescence: Secondary | ICD-10-CM | POA: Insufficient documentation

## 2020-03-17 DIAGNOSIS — R531 Weakness: Secondary | ICD-10-CM | POA: Diagnosis not present

## 2020-03-17 DIAGNOSIS — D631 Anemia in chronic kidney disease: Secondary | ICD-10-CM | POA: Diagnosis not present

## 2020-03-17 DIAGNOSIS — R609 Edema, unspecified: Secondary | ICD-10-CM | POA: Diagnosis not present

## 2020-03-17 DIAGNOSIS — Z87442 Personal history of urinary calculi: Secondary | ICD-10-CM | POA: Diagnosis not present

## 2020-03-17 DIAGNOSIS — Z79899 Other long term (current) drug therapy: Secondary | ICD-10-CM | POA: Diagnosis not present

## 2020-03-17 DIAGNOSIS — Z7984 Long term (current) use of oral hypoglycemic drugs: Secondary | ICD-10-CM | POA: Insufficient documentation

## 2020-03-17 DIAGNOSIS — Z20822 Contact with and (suspected) exposure to covid-19: Secondary | ICD-10-CM | POA: Insufficient documentation

## 2020-03-17 DIAGNOSIS — R001 Bradycardia, unspecified: Secondary | ICD-10-CM | POA: Diagnosis present

## 2020-03-17 DIAGNOSIS — Z8042 Family history of malignant neoplasm of prostate: Secondary | ICD-10-CM | POA: Diagnosis not present

## 2020-03-17 DIAGNOSIS — E785 Hyperlipidemia, unspecified: Secondary | ICD-10-CM | POA: Insufficient documentation

## 2020-03-17 DIAGNOSIS — J90 Pleural effusion, not elsewhere classified: Secondary | ICD-10-CM | POA: Diagnosis not present

## 2020-03-17 DIAGNOSIS — R600 Localized edema: Secondary | ICD-10-CM | POA: Diagnosis present

## 2020-03-17 DIAGNOSIS — I10 Essential (primary) hypertension: Secondary | ICD-10-CM | POA: Diagnosis present

## 2020-03-17 DIAGNOSIS — Z953 Presence of xenogenic heart valve: Secondary | ICD-10-CM

## 2020-03-17 LAB — CBC WITH DIFFERENTIAL/PLATELET
Abs Immature Granulocytes: 0.04 10*3/uL (ref 0.00–0.07)
Basophils Absolute: 0 10*3/uL (ref 0.0–0.1)
Basophils Relative: 0 %
Eosinophils Absolute: 0.1 10*3/uL (ref 0.0–0.5)
Eosinophils Relative: 2 %
HCT: 33.7 % — ABNORMAL LOW (ref 39.0–52.0)
Hemoglobin: 10.7 g/dL — ABNORMAL LOW (ref 13.0–17.0)
Immature Granulocytes: 1 %
Lymphocytes Relative: 12 %
Lymphs Abs: 0.9 10*3/uL (ref 0.7–4.0)
MCH: 29.9 pg (ref 26.0–34.0)
MCHC: 31.8 g/dL (ref 30.0–36.0)
MCV: 94.1 fL (ref 80.0–100.0)
Monocytes Absolute: 0.5 10*3/uL (ref 0.1–1.0)
Monocytes Relative: 7 %
Neutro Abs: 5.8 10*3/uL (ref 1.7–7.7)
Neutrophils Relative %: 78 %
Platelets: 163 10*3/uL (ref 150–400)
RBC: 3.58 MIL/uL — ABNORMAL LOW (ref 4.22–5.81)
RDW: 13.7 % (ref 11.5–15.5)
WBC: 7.4 10*3/uL (ref 4.0–10.5)
nRBC: 0 % (ref 0.0–0.2)

## 2020-03-17 LAB — CBC
HCT: 32.2 % — ABNORMAL LOW (ref 39.0–52.0)
Hemoglobin: 10.6 g/dL — ABNORMAL LOW (ref 13.0–17.0)
MCH: 30.2 pg (ref 26.0–34.0)
MCHC: 32.9 g/dL (ref 30.0–36.0)
MCV: 91.7 fL (ref 80.0–100.0)
Platelets: 174 10*3/uL (ref 150–400)
RBC: 3.51 MIL/uL — ABNORMAL LOW (ref 4.22–5.81)
RDW: 13.5 % (ref 11.5–15.5)
WBC: 8.8 10*3/uL (ref 4.0–10.5)
nRBC: 0 % (ref 0.0–0.2)

## 2020-03-17 LAB — CREATININE, SERUM
Creatinine, Ser: 0.99 mg/dL (ref 0.61–1.24)
GFR calc Af Amer: >60 mL/min (ref >60)
GFR calc non Af Amer: >60 mL/min (ref >60)

## 2020-03-17 LAB — COMPREHENSIVE METABOLIC PANEL
ALT: 19 U/L (ref 0–44)
AST: 22 U/L (ref 15–41)
Albumin: 3.8 g/dL (ref 3.5–5.0)
Alkaline Phosphatase: 61 U/L (ref 38–126)
Anion gap: 7 (ref 5–15)
BUN: 27 mg/dL — ABNORMAL HIGH (ref 8–23)
CO2: 28 mmol/L (ref 22–32)
Calcium: 8.2 mg/dL — ABNORMAL LOW (ref 8.9–10.3)
Chloride: 107 mmol/L (ref 98–111)
Creatinine, Ser: 0.96 mg/dL (ref 0.61–1.24)
GFR calc Af Amer: >60 mL/min (ref >60)
GFR calc non Af Amer: >60 mL/min (ref >60)
Glucose, Bld: 126 mg/dL — ABNORMAL HIGH (ref 70–99)
Potassium: 3.9 mmol/L (ref 3.5–5.1)
Sodium: 142 mmol/L (ref 135–145)
Total Bilirubin: 0.9 mg/dL (ref 0.3–1.2)
Total Protein: 6.6 g/dL (ref 6.5–8.1)

## 2020-03-17 LAB — URINALYSIS, ROUTINE W REFLEX MICROSCOPIC
Bilirubin Urine: NEGATIVE
Glucose, UA: NEGATIVE mg/dL
Hgb urine dipstick: NEGATIVE
Ketones, ur: NEGATIVE mg/dL
Leukocytes,Ua: NEGATIVE
Nitrite: NEGATIVE
Protein, ur: NEGATIVE mg/dL
Specific Gravity, Urine: 1.011 (ref 1.005–1.030)
pH: 6 (ref 5.0–8.0)

## 2020-03-17 LAB — TSH: TSH: 1.997 u[IU]/mL (ref 0.350–4.500)

## 2020-03-17 LAB — SARS CORONAVIRUS 2 BY RT PCR (HOSPITAL ORDER, PERFORMED IN ~~LOC~~ HOSPITAL LAB): SARS Coronavirus 2: NEGATIVE

## 2020-03-17 LAB — BRAIN NATRIURETIC PEPTIDE: B Natriuretic Peptide: 312.3 pg/mL — ABNORMAL HIGH (ref 0.0–100.0)

## 2020-03-17 LAB — TROPONIN I (HIGH SENSITIVITY)
Troponin I (High Sensitivity): 12 ng/L (ref <18)
Troponin I (High Sensitivity): 9 ng/L (ref <18)

## 2020-03-17 LAB — ETHANOL: Alcohol, Ethyl (B): <10 mg/dL (ref <10)

## 2020-03-17 MED ORDER — TERAZOSIN HCL 5 MG PO CAPS
10.0000 mg | ORAL_CAPSULE | Freq: Every day | ORAL | Status: DC
  Administered 2020-03-17: 10 mg via ORAL
  Filled 2020-03-17: qty 2

## 2020-03-17 MED ORDER — ACETAMINOPHEN 325 MG PO TABS: 650.0000 mg | ORAL_TABLET | Freq: Four times a day (QID) | ORAL | Status: DC | PRN

## 2020-03-17 MED ORDER — METHYLPHENIDATE HCL 10 MG PO TABS
10.0000 mg | ORAL_TABLET | Freq: Every day | ORAL | Status: DC
  Administered 2020-03-18: 10 mg via ORAL
  Filled 2020-03-17: qty 1

## 2020-03-17 MED ORDER — POTASSIUM CHLORIDE CRYS ER 20 MEQ PO TBCR
30.0000 meq | EXTENDED_RELEASE_TABLET | Freq: Every day | ORAL | Status: DC
  Administered 2020-03-18: 30 meq via ORAL
  Filled 2020-03-17: qty 1

## 2020-03-17 MED ORDER — VITAMIN B-12 1000 MCG PO TABS
500.0000 ug | ORAL_TABLET | Freq: Every day | ORAL | Status: DC
  Administered 2020-03-18: 500 ug via ORAL
  Filled 2020-03-17: qty 1

## 2020-03-17 MED ORDER — ONDANSETRON HCL 4 MG PO TABS: 4.0000 mg | ORAL_TABLET | Freq: Four times a day (QID) | ORAL | Status: DC | PRN

## 2020-03-17 MED ORDER — VITAMIN B 12 100 MCG PO LOZG: LOZENGE | Freq: Every day | ORAL | Status: DC

## 2020-03-17 MED ORDER — ONDANSETRON HCL 4 MG/2ML IJ SOLN: 4.0000 mg | Freq: Four times a day (QID) | INTRAMUSCULAR | Status: DC | PRN

## 2020-03-17 MED ORDER — FERROUS SULFATE 325 (65 FE) MG PO TABS
325.0000 mg | ORAL_TABLET | Freq: Every day | ORAL | Status: DC
  Administered 2020-03-18: 325 mg via ORAL
  Filled 2020-03-17: qty 1

## 2020-03-17 MED ORDER — SIMVASTATIN 20 MG PO TABS
20.0000 mg | ORAL_TABLET | ORAL | Status: DC
  Administered 2020-03-18: 20 mg via ORAL
  Filled 2020-03-17: qty 1

## 2020-03-17 MED ORDER — METFORMIN HCL ER 500 MG PO TB24
500.0000 mg | ORAL_TABLET | Freq: Every day | ORAL | Status: DC
  Administered 2020-03-18: 500 mg via ORAL
  Filled 2020-03-17: qty 1

## 2020-03-17 MED ORDER — ATENOLOL 50 MG PO TABS: 50.0000 mg | ORAL_TABLET | Freq: Every day | ORAL | Status: DC

## 2020-03-17 MED ORDER — ACETAMINOPHEN 650 MG RE SUPP: 650.0000 mg | Freq: Four times a day (QID) | RECTAL | Status: DC | PRN

## 2020-03-17 MED ORDER — ENOXAPARIN SODIUM 40 MG/0.4ML ~~LOC~~ SOLN
40.0000 mg | Freq: Every day | SUBCUTANEOUS | Status: DC
  Filled 2020-03-17: qty 0.4

## 2020-03-17 NOTE — ED Notes (Signed)
Urine culture sent to lab.

## 2020-03-17 NOTE — H&P (Signed)
History and Physical   Angel Barton:[id number] DOB: 01/27/1926 DOA: 03/17/2020  Referring MD/NP/PA: Dr. Darl Householder  PCP: Lyman Bishop, DO   Outpatient Specialists: None  Patient coming from: Home  Chief Complaint: Passing out  HPI: Angel Barton is a 86 y.o. male with medical history significant of chronic pedal edema, status post TAVR, diabetes, chronic kidney disease stage III, previous renal failure, on chronic diuresis, history of urinary retention who was brought in secondary to passing out at home.  Patient lives with his wife.  Apparently the wife stepped out briefly for 15 minutes came back and patient was passed out.  He came back to his consciousness.  Blood pressure at the time was 72/34.  EMS gave him 250 cc of fluid and brought him to the ER.  He is currently back to his baseline after resuscitation.  Vitals are stable.  The cause of his syncope however is unknown.  Denies any chest pain.  Denied any nausea vomiting or diarrhea.  He has been taking Lasix and the dose has recently been increased due to lower extremity edema.  Patient has been eating okay no decrease in his appetite.  He has not been keeping up with fluids however.  At this point patient is being admitted to the hospital for treatment and management of syncope for observation..  ED Course: Blood pressure 180/65 pulse 49 respirate 20 oxygen sat 98% on room air.  Chemistry showed a glucose of 126.  BNP 312 troponin XII 109.  CBC showed a hemoglobin 10.7 otherwise within normal.  Head CT without contrast is negative.  Urinalysis is negative.  Doppler ultrasound of the lower extremity showed no DVT.  Chest x-ray showed no acute findings.  Patient denied any abdominal pain follow-up care.  Review of Systems: As per HPI otherwise 10 point review of systems negative.    Past Medical History:  Diagnosis Date  . ADD (attention deficit disorder)   . Anxiety   . Arthritis    left leg  . BPH (benign prostatic  hyperplasia)   . CKD (chronic kidney disease), stage V ([place]) 08/14/2018   pt unaware  . Depression   . Diabetes mellitus without complication ([place])   . Does use hearing aid   . GERD (gastroesophageal reflux disease)   . Grade I diastolic dysfunction 85/88/5027   Noted on ECHO  . Hyperlipidemia   . Hypertension   . Kidney stones   . LAE (left atrial enlargement) 02/15/2018   Moderate, Noted on ECHO  . Left bundle branch block 08/13/2018   Noted on EKG   . LVH (left ventricular hypertrophy) 02/15/2018   Mild, Noted on ECHO  . Nephrolithiasis 08/14/2018  . Nocturia   . Pedal edema 02/2018  . Urinary retention 08/14/2018    Past Surgical History:  Procedure Laterality Date  . AORTIC VALVE REPLACEMENT  06/26/2017  . JOINT REPLACEMENT    . right hip replacement  1994  . TRANSURETHRAL RESECTION OF PROSTATE N/A 09/20/2018   Procedure: TRANSURETHRAL RESECTION OF THE PROSTATE (TURP);  Surgeon: Lucas Mallow, MD;  Location: [facility];  Service: Urology;  Laterality: N/A;     reports that he has quit smoking. He has never used smokeless tobacco. He reports that he does not drink alcohol or use drugs.  No Known Allergies  Family History  Problem Relation Age of Onset  . Prostate cancer Father      Prior to Admission medications   Medication Sig Start  Date End Date Taking? Authorizing Provider  atenolol (TENORMIN) 50 MG tablet Take 50 mg by mouth daily.   Yes [provider]  Cyanocobalamin (VITAMIN B 12 PO) Take by mouth daily.   Yes [provider]  ferrous sulfate 325 (65 FE) MG tablet Take 325 mg by mouth daily with breakfast.   Yes [provider]  furosemide (LASIX) 40 MG tablet Take 1.5 tablets (60 mg total) by mouth daily. 02/21/20  Yes Imogene Burn, PA-C  metFORMIN (GLUCOPHAGE-XR) 500 MG 24 hr tablet Take 500 mg by mouth daily with breakfast.   Yes [provider]  methylphenidate (RITALIN) 20 MG tablet Take 10 mg by mouth daily.     Yes [provider]  potassium chloride SA (KLOR-CON) 20 MEQ tablet Take 1.5 tablets (30 mEq total) by mouth daily. 02/21/20  Yes Imogene Burn, PA-C  simvastatin (ZOCOR) 20 MG tablet Take 20 mg by mouth every morning.    Yes [provider]  terazosin (HYTRIN) 10 MG capsule Take 10 mg by mouth at bedtime.   Yes [provider]  vitamin B-12 (CYANOCOBALAMIN) 500 MCG tablet Take 500 mcg by mouth daily. 05/25/19  Yes [provider]    Physical Exam: Vitals:   03/17/20 1630 03/17/20 1633 03/17/20 1700 03/17/20 1900  BP: (!) 141/50 (!) 141/50 (!) 133/58 (!) 160/56  Pulse: (!) 49 (!) 50 (!) 53 67  Resp: 12 13 18 15   Temp:      TempSrc:      SpO2: 100% 99% 100% 98%      Constitutional: Stable no distress. Vitals:   03/17/20 1630 03/17/20 1633 03/17/20 1700 03/17/20 1900  BP: (!) 141/50 (!) 141/50 (!) 133/58 (!) 160/56  Pulse: (!) 49 (!) 50 (!) 53 67  Resp: 12 13 18 15   Temp:      TempSrc:      SpO2: 100% 99% 100% 98%   Eyes: PERRL, lids and conjunctivae normal ENMT: Mucous membranes are moist. Posterior pharynx clear of any exudate or lesions.Normal dentition.  Neck: normal, supple, no masses, no thyromegaly Respiratory: clear to auscultation bilaterally, no wheezing, no crackles. Normal respiratory effort. No accessory muscle use.  Cardiovascular: Bradycardic no murmurs / rubs / gallops. No extremity edema. 2+ pedal pulses. No carotid bruits.  Abdomen: no tenderness, no masses palpated. No hepatosplenomegaly. Bowel sounds positive.  Musculoskeletal: no clubbing / cyanosis. No joint deformity upper and lower extremities. Good ROM, no contractures. Normal muscle tone.  Skin: no rashes, lesions, ulcers. No induration Neurologic: CN 2-12 grossly intact. Sensation intact, DTR normal. Strength 5/5 in all 4.  Psychiatric: Normal judgment and insight. Alert and oriented x 3. Normal mood.     Labs on Admission: I have personally reviewed following  labs and imaging studies  CBC: Recent Labs  Lab 03/17/20 1408  WBC 7.4  NEUTROABS 5.8  HGB 10.7*  HCT 33.7*  MCV 94.1  PLT 846   Basic Metabolic Panel: Recent Labs  Lab 03/17/20 1408  NA 142  K 3.9  CL 107  CO2 28  GLUCOSE 126*  BUN 27*  CREATININE 0.96  CALCIUM 8.2*   GFR: Estimated Creatinine Clearance: 45.9 mL/min (by C-G formula based on SCr of 0.96 mg/dL). Liver Function Tests: Recent Labs  Lab 03/17/20 1408  AST 22  ALT 19  ALKPHOS 61  BILITOT 0.9  PROT 6.6  ALBUMIN 3.8   No results for input(s): LIPASE, AMYLASE in the last 168 hours. No results for input(s):  AMMONIA in the last 168 hours. Coagulation Profile: No results for input(s): INR, PROTIME in the last 168 hours. Cardiac Enzymes: No results for input(s): CKTOTAL, CKMB, CKMBINDEX, TROPONINI in the last 168 hours. BNP (last 3 results) Recent Labs    02/06/20 1028  PROBNP 951*   HbA1C: No results for input(s): HGBA1C in the last 72 hours. CBG: No results for input(s): GLUCAP in the last 168 hours. Lipid Profile: No results for input(s): CHOL, HDL, LDLCALC, TRIG, CHOLHDL, LDLDIRECT in the last 72 hours. Thyroid Function Tests: No results for input(s): TSH, T4TOTAL, FREET4, T3FREE, THYROIDAB in the last 72 hours. Anemia Panel: No results for input(s): VITAMINB12, FOLATE, FERRITIN, TIBC, IRON, RETICCTPCT in the last 72 hours. Urine analysis:    Component Value Date/Time   COLORURINE YELLOW 03/17/2020 1408   APPEARANCEUR CLEAR 03/17/2020 1408   LABSPEC 1.011 03/17/2020 1408   PHURINE 6.0 03/17/2020 1408   GLUCOSEU NEGATIVE 03/17/2020 1408   HGBUR NEGATIVE 03/17/2020 1408   BILIRUBINUR NEGATIVE 03/17/2020 1408   KETONESUR NEGATIVE 03/17/2020 1408   PROTEINUR NEGATIVE 03/17/2020 1408   NITRITE NEGATIVE 03/17/2020 1408   LEUKOCYTESUR NEGATIVE 03/17/2020 1408   Sepsis Labs: @LABRCNTIP (procalcitonin:4,lacticidven:4) ) Recent Results (from the past 240 hour(s))  SARS Coronavirus 2 by RT  PCR (hospital order, performed in [facility] hospital lab) Nasopharyngeal Nasopharyngeal Swab     Status: None   Collection Time: 03/17/20  6:02 PM   Specimen: Nasopharyngeal Swab  Result Value Ref Range Status   SARS Coronavirus 2 NEGATIVE NEGATIVE Final    Comment: (NOTE) SARS-CoV-2 target nucleic acids are NOT DETECTED. The SARS-CoV-2 RNA is generally detectable in upper and lower respiratory specimens during the acute phase of infection. The lowest concentration of SARS-CoV-2 viral copies this assay can detect is 250 copies / mL. A negative result does not preclude SARS-CoV-2 infection and should not be used as the sole basis for treatment or other patient management decisions.  A negative result may occur with improper specimen collection / handling, submission of specimen other than nasopharyngeal swab, presence of viral mutation(s) within the areas targeted by this assay, and inadequate number of viral copies (<250 copies / mL). A negative result must be combined with clinical observations, patient history, and epidemiological information. Fact Sheet for Patients:   StrictlyIdeas.no Fact Sheet for Healthcare Providers: BankingDealers.co.za This test is not yet approved or cleared  by the [place] FDA and has been authorized for detection and/or diagnosis of SARS-CoV-2 by FDA under an Emergency Use Authorization (EUA).  This EUA will remain in effect (meaning this test can be used) for the duration of the COVID-19 declaration under Section 564(b)(1) of the Act, 21 U.S.C. section 360bbb-3(b)(1), unless the authorization is terminated or revoked sooner. Performed at [facility], [place] [address] [place], [place] [address]      Radiological Exams on Admission: DG Chest Port 1 View  Result Date: 03/17/2020 CLINICAL DATA:  Lethargy.  Weakness. EXAM: PORTABLE CHEST 1 VIEW COMPARISON:  None. FINDINGS: Heart size  is accentuated by technique and mildly enlarged. Remote aortic valve repair. The lungs are free of focal consolidations and pleural effusions. No pulmonary edema. There is minimal subsegmental atelectasis at both lung bases. IMPRESSION: 1. Cardiomegaly. 2. Bibasilar atelectasis. Electronically Signed   By: Nolon Nations M.D.   On: 03/17/2020 14:35   VAS [place] LOWER EXTREMITY VENOUS (DVT) ([facility] and [facility] 7a-7p)  Result Date: 03/17/2020  Lower Venous DVTStudy Indications: Edema.  Comparison Study: no prior Performing Technologist:  Abram Sander RVS  Examination Guidelines: A complete evaluation includes B-mode imaging, spectral Doppler, color Doppler, and power Doppler as needed of all accessible portions of each vessel. Bilateral testing is considered an integral part of a complete examination. Limited examinations for reoccurring indications may be performed as noted. The reflux portion of the exam is performed with the patient in reverse Trendelenburg.  +---------+---------------+---------+-----------+----------+--------------+ RIGHT    CompressibilityPhasicitySpontaneityPropertiesThrombus Aging +---------+---------------+---------+-----------+----------+--------------+ CFV      Full           Yes      Yes                                 +---------+---------------+---------+-----------+----------+--------------+ SFJ      Full                                                        +---------+---------------+---------+-----------+----------+--------------+ FV Prox  Full                                                        +---------+---------------+---------+-----------+----------+--------------+ FV Mid   Full                                                        +---------+---------------+---------+-----------+----------+--------------+ FV DistalFull                                                         +---------+---------------+---------+-----------+----------+--------------+ PFV      Full                                                        +---------+---------------+---------+-----------+----------+--------------+ POP      Full           Yes      Yes                                 +---------+---------------+---------+-----------+----------+--------------+ PTV      Full                                                        +---------+---------------+---------+-----------+----------+--------------+ PERO     Full                                                        +---------+---------------+---------+-----------+----------+--------------+   +---------+---------------+---------+-----------+----------+--------------+  LEFT     CompressibilityPhasicitySpontaneityPropertiesThrombus Aging +---------+---------------+---------+-----------+----------+--------------+ CFV      Full           Yes      Yes                                 +---------+---------------+---------+-----------+----------+--------------+ SFJ      Full                                                        +---------+---------------+---------+-----------+----------+--------------+ FV Prox  Full                                                        +---------+---------------+---------+-----------+----------+--------------+ FV Mid   Full                                                        +---------+---------------+---------+-----------+----------+--------------+ FV DistalFull                                                        +---------+---------------+---------+-----------+----------+--------------+ PFV      Full                                                        +---------+---------------+---------+-----------+----------+--------------+ POP      Full           Yes      Yes                                  +---------+---------------+---------+-----------+----------+--------------+ PTV      Full                                                        +---------+---------------+---------+-----------+----------+--------------+ PERO     Full                                                        +---------+---------------+---------+-----------+----------+--------------+     Summary: BILATERAL: - No evidence of deep vein thrombosis seen in the lower extremities, bilaterally. -   *See table(s) above for measurements and observations.    Preliminary     EKG: Independently reviewed.  Shows sinus rhythm with left bundle  branch block, mildly prolonged QTc interval.  Unchanged from previous.  Assessment/Plan Principal Problem:   Syncope and collapse Active Problems:   Pedal edema   S/p TAVR (transcatheter aortic valve replacement), bioprosthetic   Bradycardia   HTN (hypertension)     #1 syncope: Possible seizure also.  Most likely vasovagal but could also be due to hypotension.  Patient was Lasix dose was apparently increased.  His initial blood pressure was low but he is also bradycardic.  Patient will be admitted for observation.  Now back to baseline.  Monitor vitals.  If he remains bradycardic we may consider possibility of need for pacemaker.  PT OT evaluation if necessary.  Get echocardiogram.  #2 hypertension: Blood pressure has elevated now.  May resume home regimen.  #3 sinus bradycardia: Persistent.  May be related to medications.  May be related to syncope.  Will monitor overnight and depending on his bradycardia may need cardiology consult for evaluation and EP study  #4 status post TAVR: Appears stable.  Continue monitoring  #5 bilateral pedal edema: Trace edema.  Patient has been on Lasix before this.  Will hold Lasix at the moment.  Continue to monitor.   DVT prophylaxis: Lovenox Code Status: Full code Family Communication: Wife Disposition Plan: Home Consults called:  None at the moment but cardiology consult if bradycardia persists Admission status: Observation  Severity of Illness: The appropriate patient status for this patient is OBSERVATION. Observation status is judged to be reasonable and necessary in order to provide the required intensity of service to ensure the patient's safety. The patient's presenting symptoms, physical exam findings, and initial radiographic and laboratory data in the context of their medical condition is felt to place them at decreased risk for further clinical deterioration. Furthermore, it is anticipated that the patient will be medically stable for discharge from the hospital within 2 midnights of admission. The following factors support the patient status of observation.   " The patient's presenting symptoms include syncope. " The physical exam findings include no significant findings at this point except for bradycardia. " The initial radiographic and laboratory data are within normal.     Jariana Shumard,LAWAL MD [facility] Pager [phone]  If 7PM-7AM, please contact night-coverage [email or web address] Password [facility]  03/17/2020, 7:18 PM

## 2020-03-17 NOTE — ED Provider Notes (Signed)
[facility] DEPT Provider Note   CSN: [id number] Arrival date & time: 03/17/20  1313     History Chief Complaint  Patient presents with  . Weakness    Angel Barton is a 86 y.o. male with past medical history of CKD, BPH, DM, hypertension, hyperlipidemia, CHF, status post TAVR who presents today for evaluation after syncopal event.  History obtained from wife, chart review, EMS and triage notes.  Patient wife went outside for about 15 minutes when she came back and he was unconscious.  She says that he was not acting right.  EMS reports his blood pressure was 72/34 on arrival.  He was given 250 cc of normal saline in route by EMS.  On my evaluation reports that he feels fine.  He has been on increasing doses of Lasix recently for leg edema and notes that his right leg is more swollen than the left.  Denies any chest pain or headache.  His wife reports that he does not drink enough p.o. fluids.   HPI     Past Medical History:  Diagnosis Date  . ADD (attention deficit disorder)   . Anxiety   . Arthritis    left leg  . BPH (benign prostatic hyperplasia)   . CKD (chronic kidney disease), stage V ([place]) 08/14/2018   pt unaware  . Depression   . Diabetes mellitus without complication ([place])   . Does use hearing aid   . GERD (gastroesophageal reflux disease)   . Grade I diastolic dysfunction 26/33/3545   Noted on ECHO  . Hyperlipidemia   . Hypertension   . Kidney stones   . LAE (left atrial enlargement) 02/15/2018   Moderate, Noted on ECHO  . Left bundle branch block 08/13/2018   Noted on EKG   . LVH (left ventricular hypertrophy) 02/15/2018   Mild, Noted on ECHO  . Nephrolithiasis 08/14/2018  . Nocturia   . Pedal edema 02/2018  . Urinary retention 08/14/2018    Patient Active Problem List   Diagnosis Date Noted  . Combined forms of age-related cataract of right eye 06/09/2019  . Intraoperative floppy iris syndrome (IFIS) 06/09/2019  .  Combined forms of age-related cataract of left eye 06/02/2019  . Urinary retention 09/20/2018  . Preoperative cardiovascular examination 09/16/2018  . Pressure injury of skin 08/14/2018  . ARF (acute renal failure) ([place]) 08/13/2018  . Pedal edema 02/04/2018  . S/p TAVR (transcatheter aortic valve replacement), bioprosthetic 02/04/2018  . Encounter for follow-up for aortic valve replacement 01/29/2018  . Fall 01/29/2018  . Hematuria 01/29/2018    Past Surgical History:  Procedure Laterality Date  . AORTIC VALVE REPLACEMENT  06/26/2017  . JOINT REPLACEMENT    . right hip replacement  1994  . TRANSURETHRAL RESECTION OF PROSTATE N/A 09/20/2018   Procedure: TRANSURETHRAL RESECTION OF THE PROSTATE (TURP);  Surgeon: Lucas Mallow, MD;  Location: [facility];  Service: Urology;  Laterality: N/A;       Family History  Problem Relation Age of Onset  . Prostate cancer Father     Social History   Tobacco Use  . Smoking status: Former Research scientist (life sciences)  . Smokeless tobacco: Never Used  Substance Use Topics  . Alcohol use: Never  . Drug use: Never    Home Medications Prior to Admission medications   Medication Sig Start Date End Date Taking? Authorizing Provider  atenolol (TENORMIN) 50 MG tablet Take 50 mg by mouth daily.   Yes [provider]  Cyanocobalamin (  VITAMIN B 12 PO) Take by mouth daily.   Yes [provider]  ferrous sulfate 325 (65 FE) MG tablet Take 325 mg by mouth daily with breakfast.   Yes [provider]  furosemide (LASIX) 40 MG tablet Take 1.5 tablets (60 mg total) by mouth daily. 02/21/20  Yes Imogene Burn, PA-C  metFORMIN (GLUCOPHAGE-XR) 500 MG 24 hr tablet Take 500 mg by mouth daily with breakfast.   Yes [provider]  methylphenidate (RITALIN) 20 MG tablet Take 10 mg by mouth daily.    Yes [provider]  potassium chloride SA (KLOR-CON) 20 MEQ tablet Take 1.5 tablets (30 mEq total) by mouth daily. 02/21/20  Yes Imogene Burn, PA-C  simvastatin (ZOCOR) 20 MG tablet Take 20 mg by mouth every morning.    Yes [provider]  terazosin (HYTRIN) 10 MG capsule Take 10 mg by mouth at bedtime.   Yes [provider]  vitamin B-12 (CYANOCOBALAMIN) 500 MCG tablet Take 500 mcg by mouth daily. 05/25/19  Yes [provider]    Allergies    Patient has no known allergies.  Review of Systems   Review of Systems  Constitutional: Negative for chills and fever.  Respiratory: Negative for cough and shortness of breath.   Cardiovascular: Negative for chest pain.  Gastrointestinal: Negative for abdominal pain, diarrhea, nausea and vomiting.  Genitourinary: Negative for dysuria and urgency.  Musculoskeletal: Negative for back pain and neck pain.  Neurological: Positive for syncope and weakness. Negative for dizziness and headaches.  Psychiatric/Behavioral: Negative for behavioral problems. The patient is not nervous/anxious.   All other systems reviewed and are negative.   Physical Exam Updated Vital Signs BP (!) 133/58   Pulse (!) 53   Temp 97.9 F (36.6 C) (Oral)   Resp 18   SpO2 100%   Physical Exam Vitals and nursing note reviewed.  Constitutional:      General: He is not in acute distress.    Appearance: He is well-developed. He is not diaphoretic.  HENT:     Head: Normocephalic and atraumatic.     Mouth/Throat:     Mouth: Mucous membranes are moist.  Eyes:     General: No scleral icterus.       Right eye: No discharge.        Left eye: No discharge.     Conjunctiva/sclera: Conjunctivae normal.  Cardiovascular:     Rate and Rhythm: Regular rhythm. Bradycardia present.     Pulses: Normal pulses.          Radial pulses are 2+ on the right side and 2+ on the left side.       Dorsalis pedis pulses are 2+ on the right side and 2+ on the left side.     Heart sounds: Murmur present.  Pulmonary:     Effort: Pulmonary effort is normal. No respiratory distress.     Breath  sounds: No stridor.  Abdominal:     General: There is no distension.     Tenderness: There is no abdominal tenderness. There is no guarding.  Musculoskeletal:        General: No deformity.     Cervical back: Normal range of motion and neck supple. No rigidity.     Right lower leg: 3+ Edema present.     Left lower leg: No edema.  Skin:    General: Skin is warm and dry.  Neurological:     General: No focal deficit present.  Mental Status: He is alert and oriented to person, place, and time. Mental status is at baseline.     Cranial Nerves: No cranial nerve deficit.     Motor: No weakness or abnormal muscle tone.  Psychiatric:        Mood and Affect: Mood normal.        Behavior: Behavior normal.     ED Results / Procedures / Treatments   Labs (all labs ordered are listed, but only abnormal results are displayed) Labs Reviewed  COMPREHENSIVE METABOLIC PANEL - Abnormal; Notable for the following components:      Result Value   Glucose, Bld 126 (*)    BUN 27 (*)    Calcium 8.2 (*)    All other components within normal limits  CBC WITH DIFFERENTIAL/PLATELET - Abnormal; Notable for the following components:   RBC 3.58 (*)    Hemoglobin 10.7 (*)    HCT 33.7 (*)    All other components within normal limits  BRAIN NATRIURETIC PEPTIDE - Abnormal; Notable for the following components:   B Natriuretic Peptide 312.3 (*)    All other components within normal limits  SARS CORONAVIRUS 2 BY RT PCR (HOSPITAL ORDER, [facility])  URINALYSIS, ROUTINE W REFLEX MICROSCOPIC  ETHANOL  TROPONIN I (HIGH SENSITIVITY)  TROPONIN I (HIGH SENSITIVITY)    EKG EKG Interpretation  Date/Time:  Saturday March 17 2020 13:54:31 EDT Ventricular Rate:  54 PR Interval:    QRS Duration: 160 QT Interval:  538 QTC Calculation: 510 R Axis:   -26 Text Interpretation: Sinus rhythm Borderline prolonged PR interval Left bundle branch block No significant change since prior 11/19  Confirmed by Aletta Edouard [phone]) on 03/17/2020 1:58:49 PM   Radiology DG Chest Port 1 View  Result Date: 03/17/2020 CLINICAL DATA:  Lethargy.  Weakness. EXAM: PORTABLE CHEST 1 VIEW COMPARISON:  None. FINDINGS: Heart size is accentuated by technique and mildly enlarged. Remote aortic valve repair. The lungs are free of focal consolidations and pleural effusions. No pulmonary edema. There is minimal subsegmental atelectasis at both lung bases. IMPRESSION: 1. Cardiomegaly. 2. Bibasilar atelectasis. Electronically Signed   By: Nolon Nations M.D.   On: 03/17/2020 14:35   VAS [place] LOWER EXTREMITY VENOUS (DVT) ([facility] 7a-7p)  Result Date: 03/17/2020  Lower Venous DVTStudy Indications: Edema.  Comparison Study: no prior Performing Technologist: Abram Sander RVS  Examination Guidelines: A complete evaluation includes B-mode imaging, spectral Doppler, color Doppler, and power Doppler as needed of all accessible portions of each vessel. Bilateral testing is considered an integral part of a complete examination. Limited examinations for reoccurring indications may be performed as noted. The reflux portion of the exam is performed with the patient in reverse Trendelenburg.  +---------+---------------+---------+-----------+----------+--------------+ RIGHT    CompressibilityPhasicitySpontaneityPropertiesThrombus Aging +---------+---------------+---------+-----------+----------+--------------+ CFV      Full           Yes      Yes                                 +---------+---------------+---------+-----------+----------+--------------+ SFJ      Full                                                        +---------+---------------+---------+-----------+----------+--------------+  FV Prox  Full                                                        +---------+---------------+---------+-----------+----------+--------------+ FV Mid   Full                                                         +---------+---------------+---------+-----------+----------+--------------+ FV DistalFull                                                        +---------+---------------+---------+-----------+----------+--------------+ PFV      Full                                                        +---------+---------------+---------+-----------+----------+--------------+ POP      Full           Yes      Yes                                 +---------+---------------+---------+-----------+----------+--------------+ PTV      Full                                                        +---------+---------------+---------+-----------+----------+--------------+ PERO     Full                                                        +---------+---------------+---------+-----------+----------+--------------+   +---------+---------------+---------+-----------+----------+--------------+ LEFT     CompressibilityPhasicitySpontaneityPropertiesThrombus Aging +---------+---------------+---------+-----------+----------+--------------+ CFV      Full           Yes      Yes                                 +---------+---------------+---------+-----------+----------+--------------+ SFJ      Full                                                        +---------+---------------+---------+-----------+----------+--------------+ FV Prox  Full                                                        +---------+---------------+---------+-----------+----------+--------------+  FV Mid   Full                                                        +---------+---------------+---------+-----------+----------+--------------+ FV DistalFull                                                        +---------+---------------+---------+-----------+----------+--------------+ PFV      Full                                                         +---------+---------------+---------+-----------+----------+--------------+ POP      Full           Yes      Yes                                 +---------+---------------+---------+-----------+----------+--------------+ PTV      Full                                                        +---------+---------------+---------+-----------+----------+--------------+ PERO     Full                                                        +---------+---------------+---------+-----------+----------+--------------+     Summary: BILATERAL: - No evidence of deep vein thrombosis seen in the lower extremities, bilaterally. -   *See table(s) above for measurements and observations.    Preliminary     Procedures Procedures (including critical care time)  Medications Ordered in ED Medications - No data to display  ED Course  I have reviewed the triage vital signs and the nursing notes.  Pertinent labs & imaging results that were available during my care of the patient were reviewed by me and considered in my medical decision making (see chart for details).  Clinical Course as of Mar 17 1817  Sat Jun 05, 322  [id number] 86 year old male brought in by EMS for altered mental status and hypotension.  Patient does not know why he is here.  Has no specific complaints.  Bradycardic here normotensive now after some fluids.  Getting labs Chest x-ray and duplex lower extremity.   [MB]  1813 I spoke with Dr. Jonelle Sidle who will see patient for admission.    [EH]    Clinical Course User Index [EH] Lorin Glass, PA-C [MB] Hayden Rasmussen, MD   MDM Rules/Calculators/A&P                     Patient is a 86 year old man who presents today for evaluation of a syncopal event.  His wife  had gone outside and when she came back and found him unresponsive, not acting right on the couch.  When EMS arrived his blood pressure was in the low 49P systolic.  This resolved with IV fluids.  Given the patient's  history of CHF and systolic BP under 90 (abet with ems) he is not in the low risk group per [facility] syncope rule.  Labs are obtained and reviewed, BNP is elevated at 312, no prior for comparison.  CBC shows mild anemia with a hemoglobin of 10.7.  CBC and UA are unremarkable.  Here he is not orthostatic.  Chest x-ray shows bibasilar atelectasis.  DVT study bilaterally without evidence of DVT.  Given patient's hypotension, what appears to have significantly improved, will pursue admission for high risk syncope. I suspect that his symptoms may be a combination of overdiuresis and poor p.o. intake however I discussed risks of discharge versus admission with patient and his wife.  Spoke with Dr. Jonelle Sidle who will see the patient for admission.  Covid test is ordered.  Note: Portions of this report may have been transcribed using voice recognition software. Every effort was made to ensure accuracy; however, inadvertent computerized transcription errors may be present  Final Clinical Impression(s) / ED Diagnoses Final diagnoses:  Hypotension, unspecified hypotension type  Bradycardia    Rx / DC Orders ED Discharge Orders    None       Ollen Gross 03/17/20 Edd Fabian, MD 03/18/20 [phone]

## 2020-03-17 NOTE — ED Triage Notes (Signed)
Per EMS, pt from home here for weakness. Family report they left the patient to go on a walk for ~1 hour, then when they came back the patient was lethargic and "out of it". No loss of consciousness. Pt told EMS he felt weak, BP 72/34 upon arrival, HR 48-50, CBG 241.   Pt received 240cc of NS en route. 12 lead showed left bundle branch block, bradycardia.   96% on RA.  BP 122/56 after 250 cc bolus

## 2020-03-17 NOTE — ED Notes (Signed)
Pt given water and sandwich approved by Melina Copa, MD

## 2020-03-17 NOTE — Progress Notes (Signed)
Lower extremity venous has been completed.   Preliminary results in CV Proc.   Abram Sander 03/17/2020 4:31 PM

## 2020-03-18 ENCOUNTER — Encounter (HOSPITAL_COMMUNITY): Payer: Self-pay | Admitting: Internal Medicine

## 2020-03-18 ENCOUNTER — Other Ambulatory Visit: Payer: Self-pay

## 2020-03-18 ENCOUNTER — Observation Stay (HOSPITAL_BASED_OUTPATIENT_CLINIC_OR_DEPARTMENT_OTHER): Payer: Medicare Other

## 2020-03-18 DIAGNOSIS — R001 Bradycardia, unspecified: Secondary | ICD-10-CM

## 2020-03-18 DIAGNOSIS — I10 Essential (primary) hypertension: Secondary | ICD-10-CM

## 2020-03-18 DIAGNOSIS — R55 Syncope and collapse: Secondary | ICD-10-CM

## 2020-03-18 DIAGNOSIS — Z953 Presence of xenogenic heart valve: Secondary | ICD-10-CM

## 2020-03-18 DIAGNOSIS — R600 Localized edema: Secondary | ICD-10-CM | POA: Diagnosis not present

## 2020-03-18 LAB — COMPREHENSIVE METABOLIC PANEL
ALT: 16 U/L (ref 0–44)
AST: 19 U/L (ref 15–41)
Albumin: 3.6 g/dL (ref 3.5–5.0)
Alkaline Phosphatase: 56 U/L (ref 38–126)
Anion gap: 10 (ref 5–15)
BUN: 26 mg/dL — ABNORMAL HIGH (ref 8–23)
CO2: 28 mmol/L (ref 22–32)
Calcium: 8.4 mg/dL — ABNORMAL LOW (ref 8.9–10.3)
Chloride: 99 mmol/L (ref 98–111)
Creatinine, Ser: 1 mg/dL (ref 0.61–1.24)
GFR calc Af Amer: >60 mL/min (ref >60)
GFR calc non Af Amer: >60 mL/min (ref >60)
Glucose, Bld: 179 mg/dL — ABNORMAL HIGH (ref 70–99)
Potassium: 3.5 mmol/L (ref 3.5–5.1)
Sodium: 137 mmol/L (ref 135–145)
Total Bilirubin: 1 mg/dL (ref 0.3–1.2)
Total Protein: 5.9 g/dL — ABNORMAL LOW (ref 6.5–8.1)

## 2020-03-18 LAB — CBC
HCT: 31.5 % — ABNORMAL LOW (ref 39.0–52.0)
Hemoglobin: 10.1 g/dL — ABNORMAL LOW (ref 13.0–17.0)
MCH: 29 pg (ref 26.0–34.0)
MCHC: 32.1 g/dL (ref 30.0–36.0)
MCV: 90.5 fL (ref 80.0–100.0)
Platelets: 157 10*3/uL (ref 150–400)
RBC: 3.48 MIL/uL — ABNORMAL LOW (ref 4.22–5.81)
RDW: 13.4 % (ref 11.5–15.5)
WBC: 8.8 10*3/uL (ref 4.0–10.5)
nRBC: 0 % (ref 0.0–0.2)

## 2020-03-18 LAB — ECHOCARDIOGRAM LIMITED

## 2020-03-18 MED ORDER — FUROSEMIDE 40 MG PO TABS: 40.0000 mg | ORAL_TABLET | Freq: Every day | ORAL | 3 refills | Status: AC

## 2020-03-18 NOTE — Discharge Summary (Signed)
Physician Discharge Summary  Angel Barton VXB:[id number] DOB: Apr 23, 1926 DOA: 03/17/2020  PCP: Lyman Bishop, DO  Admit date: 03/17/2020 Discharge date: 03/18/2020  Admitted From: Home Disposition:  Home  Recommendations for Outpatient Follow-up:  1. Follow up with PCP in 1-2 weeks 2. Please obtain BMP/CBC in one week  Home Health:Yes Equipment/Devices:None  Discharge Condition: Stable CODE STATUS:Full Diet recommendation:Heart healthy Brief/Interim Summary: Angel Barton is a 86 y.o. male with medical history significant of chronic pedal edema, status post TAVR, diabetes, chronic kidney disease stage III, previous renal failure, on chronic diuresis, history of urinary retention who was brought in secondary to passing out at home.  Patient lives with his wife.  Apparently the wife stepped out briefly for 15 minutes came back and patient was passed out.  He came back to his consciousness.  Blood pressure at the time was 72/34.  EMS gave him 250 cc of fluid and brought him to the ER.  He is currently back to his baseline after resuscitation. The cause of his syncope however is unknown.  Denies any chest pain.  Denied any nausea vomiting or diarrhea.  He has been taking Lasix and the dose has recently been increased due to lower extremity edema.  Patient has been eating okay no decrease in his appetite.  He has not been keeping up with fluids however.  At this point patient is being admitted to the hospital for treatment and management of syncope for observation. In the ED, patient's blood pressure 180/65 pulse 49 respirate 20 oxygen sat 98% on room air.  Chemistry showed a glucose of 126.  BNP 312 troponin XII 109.  CBC showed a hemoglobin 10.7 otherwise within normal.  Head CT without contrast is negative.  Urinalysis is negative.  Doppler ultrasound of the lower extremity showed no DVT.  Chest x-ray showed no acute findings.    He underwent a TTE which revealed aortic valve replacement with  no issues with normal EF. When asked if he could have accidentally taken his medication twice he said this was possible, but he was unsure. His BP and heart rate both improved after holding atenolol. He had no complaints and was back to his baseline status. His wife and son arrived at bedside and also report that he appears well. Given the syncope with hypotension and bradycardia will continue to hold atenolol and return lasix dose back down to 40 mg daily. Also instructed patient and wife to discuss compression wraps with his PCP given his chronic venous hypertension/venous stasis. Patient will be discharged home with home health (no changes from previous) with his son and wife. He is discharged home in stable condition.    Discharge Diagnoses:  Principal Problem:   Syncope and collapse Active Problems:   Pedal edema   S/p TAVR (transcatheter aortic valve replacement), bioprosthetic   Bradycardia   HTN (hypertension)    Discharge Instructions   Allergies as of 03/18/2020   No Known Allergies     Medication List    STOP taking these medications   atenolol 50 MG tablet Commonly known as: TENORMIN     TAKE these medications   ferrous sulfate 325 (65 FE) MG tablet Take 325 mg by mouth daily with breakfast.   furosemide 40 MG tablet Commonly known as: LASIX Take 1 tablet (40 mg total) by mouth daily. What changed: how much to take   metFORMIN 500 MG 24 hr tablet Commonly known as: GLUCOPHAGE-XR Take 500 mg by mouth daily with  breakfast.   methylphenidate 20 MG tablet Commonly known as: RITALIN Take 10 mg by mouth daily.   potassium chloride SA 20 MEQ tablet Commonly known as: KLOR-CON Take 1.5 tablets (30 mEq total) by mouth daily.   simvastatin 20 MG tablet Commonly known as: ZOCOR Take 20 mg by mouth every morning.   terazosin 10 MG capsule Commonly known as: HYTRIN Take 10 mg by mouth at bedtime.   VITAMIN B 12 PO Take by mouth daily. What changed: Another  medication with the same name was removed. Continue taking this medication, and follow the directions you see here.      Follow-up Information    Lyman Bishop, DO Follow up in 3 day(s).   Specialty: Family Medicine Contact information: [place]  [id number] [phone]        Sueanne Margarita, MD .   Specialty: Cardiology Contact information: [phone] [address] [place] [address] [phone]          No Known Allergies  Consultations:  None   Procedures/Studies: DG Chest Port 1 View  Result Date: 03/17/2020 CLINICAL DATA:  Lethargy.  Weakness. EXAM: PORTABLE CHEST 1 VIEW COMPARISON:  None. FINDINGS: Heart size is accentuated by technique and mildly enlarged. Remote aortic valve repair. The lungs are free of focal consolidations and pleural effusions. No pulmonary edema. There is minimal subsegmental atelectasis at both lung bases. IMPRESSION: 1. Cardiomegaly. 2. Bibasilar atelectasis. Electronically Signed   By: Nolon Nations M.D.   On: 03/17/2020 14:35   VAS [place] LOWER EXTREMITY VENOUS (DVT) (MC and WL 7a-7p)  Result Date: 03/17/2020  Lower Venous DVTStudy Indications: Edema.  Comparison Study: no prior Performing Technologist: Abram Sander RVS  Examination Guidelines: A complete evaluation includes B-mode imaging, spectral Doppler, color Doppler, and power Doppler as needed of all accessible portions of each vessel. Bilateral testing is considered an integral part of a complete examination. Limited examinations for reoccurring indications may be performed as noted. The reflux portion of the exam is performed with the patient in reverse Trendelenburg.  +---------+---------------+---------+-----------+----------+--------------+ RIGHT    CompressibilityPhasicitySpontaneityPropertiesThrombus Aging +---------+---------------+---------+-----------+----------+--------------+ CFV      Full           Yes      Yes                                  +---------+---------------+---------+-----------+----------+--------------+ SFJ      Full                                                        +---------+---------------+---------+-----------+----------+--------------+ FV Prox  Full                                                        +---------+---------------+---------+-----------+----------+--------------+ FV Mid   Full                                                        +---------+---------------+---------+-----------+----------+--------------+  FV DistalFull                                                        +---------+---------------+---------+-----------+----------+--------------+ PFV      Full                                                        +---------+---------------+---------+-----------+----------+--------------+ POP      Full           Yes      Yes                                 +---------+---------------+---------+-----------+----------+--------------+ PTV      Full                                                        +---------+---------------+---------+-----------+----------+--------------+ PERO     Full                                                        +---------+---------------+---------+-----------+----------+--------------+   +---------+---------------+---------+-----------+----------+--------------+ LEFT     CompressibilityPhasicitySpontaneityPropertiesThrombus Aging +---------+---------------+---------+-----------+----------+--------------+ CFV      Full           Yes      Yes                                 +---------+---------------+---------+-----------+----------+--------------+ SFJ      Full                                                        +---------+---------------+---------+-----------+----------+--------------+ FV Prox  Full                                                         +---------+---------------+---------+-----------+----------+--------------+ FV Mid   Full                                                        +---------+---------------+---------+-----------+----------+--------------+ FV DistalFull                                                        +---------+---------------+---------+-----------+----------+--------------+   PFV      Full                                                        +---------+---------------+---------+-----------+----------+--------------+ POP      Full           Yes      Yes                                 +---------+---------------+---------+-----------+----------+--------------+ PTV      Full                                                        +---------+---------------+---------+-----------+----------+--------------+ PERO     Full                                                        +---------+---------------+---------+-----------+----------+--------------+     Summary: BILATERAL: - No evidence of deep vein thrombosis seen in the lower extremities, bilaterally. -   *See table(s) above for measurements and observations.    Preliminary    ECHOCARDIOGRAM LIMITED  Result Date: 03/18/2020    ECHOCARDIOGRAM LIMITED REPORT   Patient Name:   PHILLIPPE ORLICK Date of Exam: 03/18/2020 Medical Rec #:  [id number]       Height:       67.0 in Accession #:    [id number]      Weight:       159.0 lb Date of Birth:  22-Jan-1926       BSA:          1.834 m Patient Age:    44 years        BP:           141/60 mmHg Patient Gender: M               HR:           57 bpm. Exam Location:  Inpatient Procedure: Limited Echo, Limited Color Doppler and Cardiac Doppler Indications:    Syncope R55  History:        Patient has prior history of Echocardiogram examinations, most                 recent 02/13/2020. Aortic Valve Disease; Risk                 Factors:Hypertension, Dyslipidemia and Diabetes.                 Aortic Valve: TAVR  valve is present in the aortic position.                 Procedure Date: 06/26/2017.  Sonographer:    Mikki Santee RDCS (AE) Referring Phys: [place]  1. Left ventricular ejection fraction, by estimation, is 60 to 65%. The left ventricle has normal function. The left ventricle has no regional wall motion abnormalities.  2. Right ventricular systolic function is  normal. The right ventricular size is normal.  3. The mitral valve is normal in structure. No evidence of mitral valve regurgitation. No evidence of mitral stenosis.  4. The aortic valve has been repaired/replaced. Aortic valve regurgitation is not visualized. No aortic stenosis is present. There is a TAVR valve present in the aortic position. Procedure Date: 06/26/2017. Echo findings are consistent with normal structure and function of the aortic valve prosthesis.  5. The inferior vena cava is normal in size with greater than 50% respiratory variability, suggesting right atrial pressure of 3 mmHg. FINDINGS  Left Ventricle: Left ventricular ejection fraction, by estimation, is 60 to 65%. The left ventricle has normal function. The left ventricle has no regional wall motion abnormalities. The left ventricular internal cavity size was normal in size. There is  no left ventricular hypertrophy. Right Ventricle: The right ventricular size is normal. No increase in right ventricular wall thickness. Right ventricular systolic function is normal. Left Atrium: Left atrial size was normal in size. Right Atrium: Right atrial size was normal in size. Pericardium: There is no evidence of pericardial effusion. Mitral Valve: The mitral valve is normal in structure. Normal mobility of the mitral valve leaflets. Severe mitral annular calcification. No evidence of mitral valve stenosis. Tricuspid Valve: The tricuspid valve is normal in structure. Tricuspid valve regurgitation is not demonstrated. No evidence of tricuspid stenosis. Aortic Valve: The  aortic valve has been repaired/replaced. Aortic valve regurgitation is not visualized. No aortic stenosis is present. Aortic valve mean gradient measures 6.5 mmHg. Aortic valve peak gradient measures 10.8 mmHg. Aortic valve area, by VTI  measures 3.21 cm. There is a TAVR valve present in the aortic position. Procedure Date: 06/26/2017. Echo findings are consistent with normal structure and function of the aortic valve prosthesis. Pulmonic Valve: The pulmonic valve was normal in structure. Pulmonic valve regurgitation is not visualized. No evidence of pulmonic stenosis. Aorta: The aortic root is normal in size and structure. Venous: The inferior vena cava is normal in size with greater than 50% respiratory variability, suggesting right atrial pressure of 3 mmHg. IAS/Shunts: No atrial level shunt detected by color flow Doppler.  LEFT VENTRICLE PLAX 2D LVOT diam:     2.50 cm LV SV:         142 LV SV Index:   78 LVOT Area:     4.91 cm  AORTIC VALVE AV Area (Vmax):    3.07 cm AV Area (Vmean):   3.08 cm AV Area (VTI):     3.21 cm AV Vmax:           164.50 cm/s AV Vmean:          119.500 cm/s AV VTI:            0.444 m AV Peak Grad:      10.8 mmHg AV Mean Grad:      6.5 mmHg LVOT Vmax:         103.00 cm/s LVOT Vmean:        75.100 cm/s LVOT VTI:          0.290 m LVOT/AV VTI ratio: 0.65  SHUNTS Systemic VTI:  0.29 m Systemic Diam: 2.50 cm Candee Furbish MD Electronically signed by Candee Furbish MD Signature Date/Time: 03/18/2020/11:36:14 AM    Final     TTE: IMPRESSIONS    1. Left ventricular ejection fraction, by estimation, is 60 to 65%. The  left ventricle has normal function. The left ventricle has no regional  wall motion abnormalities.  2. Right ventricular systolic function is normal. The right ventricular  size is normal.  3. The mitral valve is normal in structure. No evidence of mitral valve  regurgitation. No evidence of mitral stenosis.  4. The aortic valve has been repaired/replaced. Aortic valve   regurgitation is not visualized. No aortic stenosis is present. There is a  TAVR valve present in the aortic position. Procedure Date: 06/26/2017. Echo  findings are consistent with normal  structure and function of the aortic valve prosthesis.  5. The inferior vena cava is normal in size with greater than 50%  respiratory variability, suggesting right atrial pressure of 3 mmHg.    Subjective: Patient with no complaints. Asking to go home today.   Discharge Exam: Vitals:   03/18/20 0605 03/18/20 1059  BP: (!) 141/60 (!) 140/54  Pulse: (!) 52 62  Resp: 16 16  Temp: 98.4 F (36.9 C) 98.1 F (36.7 C)  SpO2: 98% 98%   Vitals:   03/18/20 0200 03/18/20 0231 03/18/20 0605 03/18/20 1059  BP:  (!) 168/70 (!) 141/60 (!) 140/54  Pulse:  65 (!) 52 62  Resp:  17 16 16   Temp:  98 F (36.7 C) 98.4 F (36.9 C) 98.1 F (36.7 C)  TempSrc:  Oral Oral Oral  SpO2: 95% 97% 98% 98%  Weight:    71.1 kg  Height:    5\' 7"  (1.702 m)    General: Pt is alert, awake, not in acute distress, hard of hearing Cardiovascular: RRR, S1/S2 +, no rubs, no gallops Respiratory: CTA bilaterally, no wheezing, no rhonchi Abdominal: Soft, NT, ND, bowel sounds + Extremities: no edema, no cyanosis    The results of significant diagnostics from this hospitalization (including imaging, microbiology, ancillary and laboratory) are listed below for reference.     Microbiology: Recent Results (from the past 240 hour(s))  SARS Coronavirus 2 by RT PCR (hospital order, performed in [facility] hospital lab) Nasopharyngeal Nasopharyngeal Swab     Status: None   Collection Time: 03/17/20  6:02 PM   Specimen: Nasopharyngeal Swab  Result Value Ref Range Status   SARS Coronavirus 2 NEGATIVE NEGATIVE Final    Comment: (NOTE) SARS-CoV-2 target nucleic acids are NOT DETECTED. The SARS-CoV-2 RNA is generally detectable in upper and lower respiratory specimens during the acute phase of infection. The lowest concentration  of SARS-CoV-2 viral copies this assay can detect is 250 copies / mL. A negative result does not preclude SARS-CoV-2 infection and should not be used as the sole basis for treatment or other patient management decisions.  A negative result may occur with improper specimen collection / handling, submission of specimen other than nasopharyngeal swab, presence of viral mutation(s) within the areas targeted by this assay, and inadequate number of viral copies (<250 copies / mL). A negative result must be combined with clinical observations, patient history, and epidemiological information. Fact Sheet for Patients:   StrictlyIdeas.no Fact Sheet for Healthcare Providers: BankingDealers.co.za This test is not yet approved or cleared  by the [place] FDA and has been authorized for detection and/or diagnosis of SARS-CoV-2 by FDA under an Emergency Use Authorization (EUA).  This EUA will remain in effect (meaning this test can be used) for the duration of the COVID-19 declaration under Section 564(b)(1) of the Act, 21 U.S.C. section 360bbb-3(b)(1), unless the authorization is terminated or revoked sooner. Performed at [facility], [place] [address] [place], [place] [address]      Labs: BNP (last 3 results) Recent  Labs    03/17/20 1408  BNP 811.9*   Basic Metabolic Panel: Recent Labs  Lab 03/17/20 1408 03/17/20 2228 03/18/20 0436  NA 142  --  137  K 3.9  --  3.5  CL 107  --  99  CO2 28  --  28  GLUCOSE 126*  --  179*  BUN 27*  --  26*  CREATININE 0.96 0.99 1.00  CALCIUM 8.2*  --  8.4*   Liver Function Tests: Recent Labs  Lab 03/17/20 1408 03/18/20 0436  AST 22 19  ALT 19 16  ALKPHOS 61 56  BILITOT 0.9 1.0  PROT 6.6 5.9*  ALBUMIN 3.8 3.6   No results for input(s): LIPASE, AMYLASE in the last 168 hours. No results for input(s): AMMONIA in the last 168 hours. CBC: Recent Labs  Lab 03/17/20 1408  03/17/20 2228 03/18/20 0436  WBC 7.4 8.8 8.8  NEUTROABS 5.8  --   --   HGB 10.7* 10.6* 10.1*  HCT 33.7* 32.2* 31.5*  MCV 94.1 91.7 90.5  PLT 163 174 157   Cardiac Enzymes: No results for input(s): CKTOTAL, CKMB, CKMBINDEX, TROPONINI in the last 168 hours. BNP: Invalid input(s): POCBNP CBG: No results for input(s): GLUCAP in the last 168 hours. D-Dimer No results for input(s): DDIMER in the last 72 hours. Hgb A1c No results for input(s): HGBA1C in the last 72 hours. Lipid Profile No results for input(s): CHOL, HDL, LDLCALC, TRIG, CHOLHDL, LDLDIRECT in the last 72 hours. Thyroid function studies Recent Labs    03/17/20 2228  TSH 1.997   Anemia work up No results for input(s): VITAMINB12, FOLATE, FERRITIN, TIBC, IRON, RETICCTPCT in the last 72 hours. Urinalysis    Component Value Date/Time   COLORURINE YELLOW 03/17/2020 1408   APPEARANCEUR CLEAR 03/17/2020 1408   LABSPEC 1.011 03/17/2020 1408   PHURINE 6.0 03/17/2020 1408   GLUCOSEU NEGATIVE 03/17/2020 1408   HGBUR NEGATIVE 03/17/2020 1408   [place] 03/17/2020 1408   KETONESUR NEGATIVE 03/17/2020 1408   PROTEINUR NEGATIVE 03/17/2020 1408   NITRITE NEGATIVE 03/17/2020 1408   LEUKOCYTESUR NEGATIVE 03/17/2020 1408   Sepsis Labs Invalid input(s): PROCALCITONIN,  WBC,  LACTICIDVEN Microbiology Recent Results (from the past 240 hour(s))  SARS Coronavirus 2 by RT PCR (hospital order, performed in [place] hospital lab) Nasopharyngeal Nasopharyngeal Swab     Status: None   Collection Time: 03/17/20  6:02 PM   Specimen: Nasopharyngeal Swab  Result Value Ref Range Status   SARS Coronavirus 2 NEGATIVE NEGATIVE Final    Comment: (NOTE) SARS-CoV-2 target nucleic acids are NOT DETECTED. The SARS-CoV-2 RNA is generally detectable in upper and lower respiratory specimens during the acute phase of infection. The lowest concentration of SARS-CoV-2 viral copies this assay can detect is 250 copies / mL. A negative  result does not preclude SARS-CoV-2 infection and should not be used as the sole basis for treatment or other patient management decisions.  A negative result may occur with improper specimen collection / handling, submission of specimen other than nasopharyngeal swab, presence of viral mutation(s) within the areas targeted by this assay, and inadequate number of viral copies (<250 copies / mL). A negative result must be combined with clinical observations, patient history, and epidemiological information. Fact Sheet for Patients:   StrictlyIdeas.no Fact Sheet for Healthcare Providers: BankingDealers.co.za This test is not yet approved or cleared  by the [place] FDA and has been authorized for detection and/or diagnosis of SARS-CoV-2 by FDA under an Emergency Use  Authorization (EUA).  This EUA will remain in effect (meaning this test can be used) for the duration of the COVID-19 declaration under Section 564(b)(1) of the Act, 21 U.S.C. section 360bbb-3(b)(1), unless the authorization is terminated or revoked sooner. Performed at [facility], [place] [address], [place], [place] [address]      Time coordinating discharge: Over 30 minutes  SIGNED:   Arlan Organ, DO [facility] 03/18/2020, 3:01 PM   If 7PM-7AM, please contact night-coverage [email or web address]

## 2020-03-18 NOTE — Progress Notes (Signed)
AVS given to patient and the pt's son and explained at the bedside. Medications and follow up appointments have been explained with pt and the pt's son verbalizing understanding.

## 2020-03-18 NOTE — Plan of Care (Signed)
Initiated [place], RN

## 2020-03-18 NOTE — Progress Notes (Signed)
  Echocardiogram 2D Echocardiogram has been performed.  Jennette Dubin 03/18/2020, 8:30 AM

## 2020-03-18 NOTE — ED Notes (Signed)
Pt placed in hospital bed. Pt given water and crackers.

## 2020-03-21 DIAGNOSIS — I10 Essential (primary) hypertension: Secondary | ICD-10-CM | POA: Diagnosis not present

## 2020-03-23 ENCOUNTER — Other Ambulatory Visit: Payer: Self-pay

## 2020-03-23 ENCOUNTER — Other Ambulatory Visit: Payer: Medicare Other | Admitting: Orthotics

## 2020-03-23 DIAGNOSIS — L97511 Non-pressure chronic ulcer of other part of right foot limited to breakdown of skin: Secondary | ICD-10-CM | POA: Diagnosis not present

## 2020-03-23 DIAGNOSIS — E1342 Other specified diabetes mellitus with diabetic polyneuropathy: Secondary | ICD-10-CM | POA: Diagnosis not present

## 2020-03-23 DIAGNOSIS — L84 Corns and callosities: Secondary | ICD-10-CM | POA: Diagnosis not present

## 2020-03-23 DIAGNOSIS — E1142 Type 2 diabetes mellitus with diabetic polyneuropathy: Secondary | ICD-10-CM | POA: Diagnosis not present

## 2020-03-26 DIAGNOSIS — I132 Hypertensive heart and chronic kidney disease with heart failure and with stage 5 chronic kidney disease, or end stage renal disease: Secondary | ICD-10-CM | POA: Diagnosis not present

## 2020-03-26 DIAGNOSIS — E1122 Type 2 diabetes mellitus with diabetic chronic kidney disease: Secondary | ICD-10-CM | POA: Diagnosis not present

## 2020-03-26 DIAGNOSIS — N185 Chronic kidney disease, stage 5: Secondary | ICD-10-CM | POA: Diagnosis not present

## 2020-03-26 DIAGNOSIS — Z952 Presence of prosthetic heart valve: Secondary | ICD-10-CM | POA: Diagnosis not present

## 2020-03-26 DIAGNOSIS — Z7984 Long term (current) use of oral hypoglycemic drugs: Secondary | ICD-10-CM | POA: Diagnosis not present

## 2020-03-26 DIAGNOSIS — R2689 Other abnormalities of gait and mobility: Secondary | ICD-10-CM | POA: Diagnosis not present

## 2020-03-26 DIAGNOSIS — F988 Other specified behavioral and emotional disorders with onset usually occurring in childhood and adolescence: Secondary | ICD-10-CM | POA: Diagnosis not present

## 2020-03-26 DIAGNOSIS — Z87891 Personal history of nicotine dependence: Secondary | ICD-10-CM | POA: Diagnosis not present

## 2020-03-26 DIAGNOSIS — N401 Enlarged prostate with lower urinary tract symptoms: Secondary | ICD-10-CM | POA: Diagnosis not present

## 2020-03-26 DIAGNOSIS — Z9181 History of falling: Secondary | ICD-10-CM | POA: Diagnosis not present

## 2020-03-26 DIAGNOSIS — I5032 Chronic diastolic (congestive) heart failure: Secondary | ICD-10-CM | POA: Diagnosis not present

## 2020-03-26 DIAGNOSIS — F419 Anxiety disorder, unspecified: Secondary | ICD-10-CM | POA: Diagnosis not present

## 2020-03-26 DIAGNOSIS — Z7982 Long term (current) use of aspirin: Secondary | ICD-10-CM | POA: Diagnosis not present

## 2020-03-28 DIAGNOSIS — R2689 Other abnormalities of gait and mobility: Secondary | ICD-10-CM | POA: Diagnosis not present

## 2020-03-28 DIAGNOSIS — I132 Hypertensive heart and chronic kidney disease with heart failure and with stage 5 chronic kidney disease, or end stage renal disease: Secondary | ICD-10-CM | POA: Diagnosis not present

## 2020-03-28 DIAGNOSIS — N185 Chronic kidney disease, stage 5: Secondary | ICD-10-CM | POA: Diagnosis not present

## 2020-03-28 DIAGNOSIS — E1122 Type 2 diabetes mellitus with diabetic chronic kidney disease: Secondary | ICD-10-CM | POA: Diagnosis not present

## 2020-03-28 DIAGNOSIS — I5032 Chronic diastolic (congestive) heart failure: Secondary | ICD-10-CM | POA: Diagnosis not present

## 2020-03-28 DIAGNOSIS — N401 Enlarged prostate with lower urinary tract symptoms: Secondary | ICD-10-CM | POA: Diagnosis not present

## 2020-03-29 DIAGNOSIS — I5032 Chronic diastolic (congestive) heart failure: Secondary | ICD-10-CM | POA: Diagnosis not present

## 2020-03-29 DIAGNOSIS — I132 Hypertensive heart and chronic kidney disease with heart failure and with stage 5 chronic kidney disease, or end stage renal disease: Secondary | ICD-10-CM | POA: Diagnosis not present

## 2020-03-29 DIAGNOSIS — E1122 Type 2 diabetes mellitus with diabetic chronic kidney disease: Secondary | ICD-10-CM | POA: Diagnosis not present

## 2020-03-29 DIAGNOSIS — N185 Chronic kidney disease, stage 5: Secondary | ICD-10-CM | POA: Diagnosis not present

## 2020-03-29 DIAGNOSIS — N401 Enlarged prostate with lower urinary tract symptoms: Secondary | ICD-10-CM | POA: Diagnosis not present

## 2020-03-29 DIAGNOSIS — R2689 Other abnormalities of gait and mobility: Secondary | ICD-10-CM | POA: Diagnosis not present

## 2020-04-04 DIAGNOSIS — N401 Enlarged prostate with lower urinary tract symptoms: Secondary | ICD-10-CM | POA: Diagnosis not present

## 2020-04-04 DIAGNOSIS — I132 Hypertensive heart and chronic kidney disease with heart failure and with stage 5 chronic kidney disease, or end stage renal disease: Secondary | ICD-10-CM | POA: Diagnosis not present

## 2020-04-04 DIAGNOSIS — I5032 Chronic diastolic (congestive) heart failure: Secondary | ICD-10-CM | POA: Diagnosis not present

## 2020-04-04 DIAGNOSIS — E1122 Type 2 diabetes mellitus with diabetic chronic kidney disease: Secondary | ICD-10-CM | POA: Diagnosis not present

## 2020-04-04 DIAGNOSIS — R2689 Other abnormalities of gait and mobility: Secondary | ICD-10-CM | POA: Diagnosis not present

## 2020-04-04 DIAGNOSIS — N185 Chronic kidney disease, stage 5: Secondary | ICD-10-CM | POA: Diagnosis not present

## 2020-04-11 DIAGNOSIS — I132 Hypertensive heart and chronic kidney disease with heart failure and with stage 5 chronic kidney disease, or end stage renal disease: Secondary | ICD-10-CM | POA: Diagnosis not present

## 2020-04-11 DIAGNOSIS — E1122 Type 2 diabetes mellitus with diabetic chronic kidney disease: Secondary | ICD-10-CM | POA: Diagnosis not present

## 2020-04-11 DIAGNOSIS — N401 Enlarged prostate with lower urinary tract symptoms: Secondary | ICD-10-CM | POA: Diagnosis not present

## 2020-04-11 DIAGNOSIS — R2689 Other abnormalities of gait and mobility: Secondary | ICD-10-CM | POA: Diagnosis not present

## 2020-04-11 DIAGNOSIS — I5032 Chronic diastolic (congestive) heart failure: Secondary | ICD-10-CM | POA: Diagnosis not present

## 2020-04-11 DIAGNOSIS — N185 Chronic kidney disease, stage 5: Secondary | ICD-10-CM | POA: Diagnosis not present

## 2020-04-14 DIAGNOSIS — I5032 Chronic diastolic (congestive) heart failure: Secondary | ICD-10-CM | POA: Diagnosis not present

## 2020-04-14 DIAGNOSIS — N401 Enlarged prostate with lower urinary tract symptoms: Secondary | ICD-10-CM | POA: Diagnosis not present

## 2020-04-14 DIAGNOSIS — N185 Chronic kidney disease, stage 5: Secondary | ICD-10-CM | POA: Diagnosis not present

## 2020-04-14 DIAGNOSIS — E1122 Type 2 diabetes mellitus with diabetic chronic kidney disease: Secondary | ICD-10-CM | POA: Diagnosis not present

## 2020-04-14 DIAGNOSIS — I132 Hypertensive heart and chronic kidney disease with heart failure and with stage 5 chronic kidney disease, or end stage renal disease: Secondary | ICD-10-CM | POA: Diagnosis not present

## 2020-04-14 DIAGNOSIS — R2689 Other abnormalities of gait and mobility: Secondary | ICD-10-CM | POA: Diagnosis not present

## 2020-04-21 DIAGNOSIS — I5032 Chronic diastolic (congestive) heart failure: Secondary | ICD-10-CM | POA: Diagnosis not present

## 2020-04-21 DIAGNOSIS — I132 Hypertensive heart and chronic kidney disease with heart failure and with stage 5 chronic kidney disease, or end stage renal disease: Secondary | ICD-10-CM | POA: Diagnosis not present

## 2020-04-21 DIAGNOSIS — N185 Chronic kidney disease, stage 5: Secondary | ICD-10-CM | POA: Diagnosis not present

## 2020-04-21 DIAGNOSIS — E1122 Type 2 diabetes mellitus with diabetic chronic kidney disease: Secondary | ICD-10-CM | POA: Diagnosis not present

## 2020-04-21 DIAGNOSIS — N401 Enlarged prostate with lower urinary tract symptoms: Secondary | ICD-10-CM | POA: Diagnosis not present

## 2020-04-21 DIAGNOSIS — R2689 Other abnormalities of gait and mobility: Secondary | ICD-10-CM | POA: Diagnosis not present

## 2020-04-23 DIAGNOSIS — I132 Hypertensive heart and chronic kidney disease with heart failure and with stage 5 chronic kidney disease, or end stage renal disease: Secondary | ICD-10-CM | POA: Diagnosis not present

## 2020-04-23 DIAGNOSIS — N185 Chronic kidney disease, stage 5: Secondary | ICD-10-CM | POA: Diagnosis not present

## 2020-04-23 DIAGNOSIS — E1122 Type 2 diabetes mellitus with diabetic chronic kidney disease: Secondary | ICD-10-CM | POA: Diagnosis not present

## 2020-04-23 DIAGNOSIS — I5032 Chronic diastolic (congestive) heart failure: Secondary | ICD-10-CM | POA: Diagnosis not present

## 2020-04-23 DIAGNOSIS — N401 Enlarged prostate with lower urinary tract symptoms: Secondary | ICD-10-CM | POA: Diagnosis not present

## 2020-04-23 DIAGNOSIS — R2689 Other abnormalities of gait and mobility: Secondary | ICD-10-CM | POA: Diagnosis not present

## 2020-04-25 DIAGNOSIS — F988 Other specified behavioral and emotional disorders with onset usually occurring in childhood and adolescence: Secondary | ICD-10-CM | POA: Diagnosis not present

## 2020-04-25 DIAGNOSIS — F419 Anxiety disorder, unspecified: Secondary | ICD-10-CM | POA: Diagnosis not present

## 2020-04-25 DIAGNOSIS — R2689 Other abnormalities of gait and mobility: Secondary | ICD-10-CM | POA: Diagnosis not present

## 2020-04-25 DIAGNOSIS — N185 Chronic kidney disease, stage 5: Secondary | ICD-10-CM | POA: Diagnosis not present

## 2020-04-25 DIAGNOSIS — Z7982 Long term (current) use of aspirin: Secondary | ICD-10-CM | POA: Diagnosis not present

## 2020-04-25 DIAGNOSIS — Z87891 Personal history of nicotine dependence: Secondary | ICD-10-CM | POA: Diagnosis not present

## 2020-04-25 DIAGNOSIS — Z9181 History of falling: Secondary | ICD-10-CM | POA: Diagnosis not present

## 2020-04-25 DIAGNOSIS — I5032 Chronic diastolic (congestive) heart failure: Secondary | ICD-10-CM | POA: Diagnosis not present

## 2020-04-25 DIAGNOSIS — I132 Hypertensive heart and chronic kidney disease with heart failure and with stage 5 chronic kidney disease, or end stage renal disease: Secondary | ICD-10-CM | POA: Diagnosis not present

## 2020-04-25 DIAGNOSIS — E1122 Type 2 diabetes mellitus with diabetic chronic kidney disease: Secondary | ICD-10-CM | POA: Diagnosis not present

## 2020-04-25 DIAGNOSIS — N401 Enlarged prostate with lower urinary tract symptoms: Secondary | ICD-10-CM | POA: Diagnosis not present

## 2020-04-25 DIAGNOSIS — Z952 Presence of prosthetic heart valve: Secondary | ICD-10-CM | POA: Diagnosis not present

## 2020-04-25 DIAGNOSIS — Z7984 Long term (current) use of oral hypoglycemic drugs: Secondary | ICD-10-CM | POA: Diagnosis not present

## 2020-04-26 DIAGNOSIS — E1122 Type 2 diabetes mellitus with diabetic chronic kidney disease: Secondary | ICD-10-CM | POA: Diagnosis not present

## 2020-04-26 DIAGNOSIS — I132 Hypertensive heart and chronic kidney disease with heart failure and with stage 5 chronic kidney disease, or end stage renal disease: Secondary | ICD-10-CM | POA: Diagnosis not present

## 2020-04-26 DIAGNOSIS — N185 Chronic kidney disease, stage 5: Secondary | ICD-10-CM | POA: Diagnosis not present

## 2020-04-26 DIAGNOSIS — N401 Enlarged prostate with lower urinary tract symptoms: Secondary | ICD-10-CM | POA: Diagnosis not present

## 2020-04-26 DIAGNOSIS — R2689 Other abnormalities of gait and mobility: Secondary | ICD-10-CM | POA: Diagnosis not present

## 2020-04-26 DIAGNOSIS — I5032 Chronic diastolic (congestive) heart failure: Secondary | ICD-10-CM | POA: Diagnosis not present

## 2020-05-07 DIAGNOSIS — I5032 Chronic diastolic (congestive) heart failure: Secondary | ICD-10-CM | POA: Diagnosis not present

## 2020-05-07 DIAGNOSIS — N401 Enlarged prostate with lower urinary tract symptoms: Secondary | ICD-10-CM | POA: Diagnosis not present

## 2020-05-07 DIAGNOSIS — N185 Chronic kidney disease, stage 5: Secondary | ICD-10-CM | POA: Diagnosis not present

## 2020-05-07 DIAGNOSIS — R2689 Other abnormalities of gait and mobility: Secondary | ICD-10-CM | POA: Diagnosis not present

## 2020-05-07 DIAGNOSIS — E1122 Type 2 diabetes mellitus with diabetic chronic kidney disease: Secondary | ICD-10-CM | POA: Diagnosis not present

## 2020-05-07 DIAGNOSIS — I132 Hypertensive heart and chronic kidney disease with heart failure and with stage 5 chronic kidney disease, or end stage renal disease: Secondary | ICD-10-CM | POA: Diagnosis not present

## 2020-05-08 DIAGNOSIS — I5032 Chronic diastolic (congestive) heart failure: Secondary | ICD-10-CM | POA: Diagnosis not present

## 2020-05-08 DIAGNOSIS — N185 Chronic kidney disease, stage 5: Secondary | ICD-10-CM | POA: Diagnosis not present

## 2020-05-08 DIAGNOSIS — E1122 Type 2 diabetes mellitus with diabetic chronic kidney disease: Secondary | ICD-10-CM | POA: Diagnosis not present

## 2020-05-08 DIAGNOSIS — I132 Hypertensive heart and chronic kidney disease with heart failure and with stage 5 chronic kidney disease, or end stage renal disease: Secondary | ICD-10-CM | POA: Diagnosis not present

## 2020-05-08 DIAGNOSIS — N401 Enlarged prostate with lower urinary tract symptoms: Secondary | ICD-10-CM | POA: Diagnosis not present

## 2020-05-08 DIAGNOSIS — R2689 Other abnormalities of gait and mobility: Secondary | ICD-10-CM | POA: Diagnosis not present

## 2020-05-11 ENCOUNTER — Ambulatory Visit (INDEPENDENT_AMBULATORY_CARE_PROVIDER_SITE_OTHER): Payer: Medicare Other

## 2020-05-11 ENCOUNTER — Ambulatory Visit (INDEPENDENT_AMBULATORY_CARE_PROVIDER_SITE_OTHER): Payer: Medicare Other | Admitting: Podiatry

## 2020-05-11 ENCOUNTER — Other Ambulatory Visit: Payer: Self-pay

## 2020-05-11 ENCOUNTER — Encounter: Payer: Self-pay | Admitting: Podiatry

## 2020-05-11 ENCOUNTER — Telehealth: Payer: Self-pay | Admitting: *Deleted

## 2020-05-11 DIAGNOSIS — E08621 Diabetes mellitus due to underlying condition with foot ulcer: Secondary | ICD-10-CM

## 2020-05-11 DIAGNOSIS — M79675 Pain in left toe(s): Secondary | ICD-10-CM | POA: Diagnosis not present

## 2020-05-11 DIAGNOSIS — M79674 Pain in right toe(s): Secondary | ICD-10-CM | POA: Diagnosis not present

## 2020-05-11 DIAGNOSIS — E1142 Type 2 diabetes mellitus with diabetic polyneuropathy: Secondary | ICD-10-CM | POA: Diagnosis not present

## 2020-05-11 DIAGNOSIS — L97512 Non-pressure chronic ulcer of other part of right foot with fat layer exposed: Secondary | ICD-10-CM

## 2020-05-11 DIAGNOSIS — B351 Tinea unguium: Secondary | ICD-10-CM

## 2020-05-11 NOTE — Telephone Encounter (Signed)
Faxed required form, with Dr. Heber [place] wound care orders, demographics to [facility] with note stating pt is established with them and our clinicals will follow.

## 2020-05-11 NOTE — Patient Instructions (Addendum)
DRESSING CHANGES SUBMET HEAD 5 RIGHT FOOT:   A. IF DISPENSED, WEAR SURGICAL SHOE/BOOT AT ALL TIMES.  B. IF PRESCRIBED ORAL ANTIBIOTICS, TAKE ALL MEDICATION AS PRESCRIBED UNTIL ALL ARE GONE.  C. IF DOCTOR HAS DESIGNATED NONWEIGHTBEARING STATUS, PLEASE ADHERE TO INSTRUCTIONS.   1. KEEP submet head 5 left foot DRY AT ALL TIMES!!!!  2. CLEANSE ULCER WITH SALINE.  3. DAB DRY WITH GAUZE SPONGE.  4. APPLY A LIGHT AMOUNT OF Iodosorb Gel TO BASE OF ULCER.  5. APPLY OUTER DRESSING AS INSTRUCTED.  6. WEAR SURGICAL SHOE/BOOT DAILY AT ALL TIMES. IF SUPPLIED, WEAR HEEL PROTECTORS AT ALL TIMES WHEN IN BED.  7. DO NOT WALK BAREFOOT!!!  8.  IF YOU EXPERIENCE ANY FEVER, CHILLS, NIGHTSWEATS, NAUSEA OR VOMITING, ELEVATED OR LOW BLOOD SUGARS, REPORT TO EMERGENCY ROOM.  9. IF YOU EXPERIENCE INCREASED REDNESS, PAIN, SWELLING, DISCOLORATION, ODOR, PUS, DRAINAGE OR WARMTH OF YOUR FOOT, REPORT TO EMERGENCY ROOM.

## 2020-05-12 NOTE — Progress Notes (Signed)
Subjective: Patient presents today with diabetes and cc of painful, discolored, thick toenails which interfere with daily activities. Pain is aggravated when wearing enclosed shoe gear. Pain is getting progressively worse and relieved with periodic professional debridement.  New concern today: Painful callus noted on the plantar aspect of the right foot.  Patient is accompanied by his daughter-in-law on today's visit.  Mr. Elahi states his right foot has been sore for the past few weeks.  He has been unable to bear weight without pain.  He denies seeing any type of drainage in his socks or shoes.  He denies any fever, chills, night sweats, nausea or vomiting.  He has not attempted to treat the area.  Daughter-in-law states that patient did receive a new pair of diabetic shoes from the [employer] and he is wearing those on today's visit.  Masneri, Adele Barthel, DO is patient's PCP. Last visit was August 18, 2019..  Current Outpatient Medications on File Prior to Visit  Medication Sig Dispense Refill  . Cyanocobalamin (VITAMIN B 12 PO) Take by mouth daily.    . ferrous sulfate 325 (65 FE) MG tablet Take 325 mg by mouth daily with breakfast.    . furosemide (LASIX) 40 MG tablet Take 1 tablet (40 mg total) by mouth daily. 135 tablet 3  . metFORMIN (GLUCOPHAGE-XR) 500 MG 24 hr tablet Take 500 mg by mouth daily with breakfast.    . methylphenidate (RITALIN) 20 MG tablet Take 10 mg by mouth daily.     . potassium chloride SA (KLOR-CON) 20 MEQ tablet Take 1.5 tablets (30 mEq total) by mouth daily. 135 tablet 3  . simvastatin (ZOCOR) 20 MG tablet Take 20 mg by mouth every morning.     . terazosin (HYTRIN) 10 MG capsule Take 10 mg by mouth at bedtime.     No current facility-administered medications on file prior to visit.     No Known Allergies   Objective: There were no vitals filed for this visit.  Angel Barton is a pleasant 86 y.o. Caucasian male WD, WN in NAD.Marland Kitchen AAO X  3.  Vascular Examination: Capillary fill time to digits <3 seconds b/l lower extremities. Palpable pedal pulses b/l LE. Pedal hair absent. Lower extremity skin temperature gradient within normal limits. No edema noted b/l lower extremities.  Dermatological Examination: Pedal skin with normal turgor, texture and tone bilaterally. No interdigital macerations bilaterally. Toenails 1-5 b/l elongated, discolored, dystrophic, thickened, crumbly with subungual debris and tenderness to dorsal palpation.    Wound Location: submet head 5 right foot There is a moderate amount of devitalized tissue present in the wound. Predebridement Wound Measurement: 2.0 x 1.8 cm.  There appears to be some elevation of about 0.3 cm. Postdebridement Wound Measurement: 1.1 x 1.1 x 0.2 cm Wound Base: Granular/Healthy Peri-wound: Normal Exudate: None: wound tissue dry Blood Loss during debridement: 0 cc('s). Material in wound which inhibits healing/promotes adjacent tissue breakdown:  exuberant hyperkeratosis. Description of tissue removed from ulceration today:  exuberant hyperkeratosis. Sign(s) of clinical bacterial infection: no clinical signs of infection noted on examination today.   Musculoskeletal Examination: Normal muscle strength 5/5 to all lower extremity muscle groups bilaterally. Tailor's bunion deformity noted b/l lower extremities. Utilizes wheelchair for mobility assistance.   Evaluated diabetic shoes from the [employer].  They were known to have Plastizote inserts with no offloading for the callus on the right foot.  I had a Pedorthist, Betha, offload area of callus.  This should be more comfortable for  him when he resumes wearing his shoes.  Neurological Examination: Protective sensation diminished with 10g monofilament b/l. Vibratory sensation diminished b/l.  Xray findings right foot: no gas in tissues, no evidence of fracture right foot and no bone erosion noted at location of  ulceration.  He does appear to have spurring which may be a contributing factor to the ulceration at the head of the fifth metatarsal.   Assessment: 1. Pain due to onychomycosis of toenails of both feet   2. Diabetic ulcer of other part of right foot associated with diabetes mellitus due to underlying condition, with fat layer exposed ([place])   3. Diabetic peripheral neuropathy associated with type 2 diabetes mellitus ([place])    Plan: -Examined patient.  -Continue diabetic foot care principles. -Patient was evaluated and treated and all questions answered.  -Patient and daughter-in-law educated on diagnosis and treatment plan of routine ulcer debridement/wound care.  Daughter-in-law requests that [facility] assist with wound care of the right foot ulceration. -Ulceration debridement achieved utilizing sharp excisional debridement with sterile scalpel blade.. Type/amount of devitalized tissue removed: exuberant hyperkeratosis -Today's ulcer size post-debridement: 1.1 x 1.1 x 0.2 cm. -Ulceration cleansed with wound cleanser. Iodosorb Gel applied to base of ulceration and secured with light dressing. -Wound responded well to today's debridement. -Patient risk factors affecting healing of ulcer: diabetes, foot deformity, neuropathy, Debility -Ahmed Prima given written instructions on daily wound care for submet head 5 right foot ulceration. -Orders sent for [place] care to be faxed to: [facility] per family request for Iodoform Gel Dressing changes. Fax sent by R.N., Harriett Sine. -Frequency of debridements needed to achieve healing: Weekly to biweekly -Toenails 1-5 b/l were debrided in length and girth with sterile nail nippers and dremel without iatrogenic bleeding.  -Patient to report any pedal injuries to medical professional immediately. -Xray of Right foot was performed and reviewed with patient and daughter in law. Reviewed findings with them in office  today. -Surgical shoe was dispensed for right foot. -Due to Dr. March Rummage being out next week, patient referred to Dr. Lanae Crumbly for follow-up of Right foot ulceration. -Patient to continue soft, supportive shoe gear daily. -Patient/POA to call should there be question/concern in the interim.  Return in about 1 week (around 05/18/2020) for diabetic ulcer right with  Dr Sherryle Lis.  Marzetta Board, DPM

## 2020-05-13 DIAGNOSIS — N401 Enlarged prostate with lower urinary tract symptoms: Secondary | ICD-10-CM | POA: Diagnosis not present

## 2020-05-13 DIAGNOSIS — N185 Chronic kidney disease, stage 5: Secondary | ICD-10-CM | POA: Diagnosis not present

## 2020-05-13 DIAGNOSIS — I5032 Chronic diastolic (congestive) heart failure: Secondary | ICD-10-CM | POA: Diagnosis not present

## 2020-05-13 DIAGNOSIS — R2689 Other abnormalities of gait and mobility: Secondary | ICD-10-CM | POA: Diagnosis not present

## 2020-05-13 DIAGNOSIS — I132 Hypertensive heart and chronic kidney disease with heart failure and with stage 5 chronic kidney disease, or end stage renal disease: Secondary | ICD-10-CM | POA: Diagnosis not present

## 2020-05-13 DIAGNOSIS — E1122 Type 2 diabetes mellitus with diabetic chronic kidney disease: Secondary | ICD-10-CM | POA: Diagnosis not present

## 2020-05-15 DIAGNOSIS — Z7984 Long term (current) use of oral hypoglycemic drugs: Secondary | ICD-10-CM | POA: Diagnosis not present

## 2020-05-15 DIAGNOSIS — E1165 Type 2 diabetes mellitus with hyperglycemia: Secondary | ICD-10-CM | POA: Diagnosis not present

## 2020-05-15 DIAGNOSIS — D649 Anemia, unspecified: Secondary | ICD-10-CM | POA: Diagnosis not present

## 2020-05-16 DIAGNOSIS — I5032 Chronic diastolic (congestive) heart failure: Secondary | ICD-10-CM | POA: Diagnosis not present

## 2020-05-16 DIAGNOSIS — R2689 Other abnormalities of gait and mobility: Secondary | ICD-10-CM | POA: Diagnosis not present

## 2020-05-16 DIAGNOSIS — N401 Enlarged prostate with lower urinary tract symptoms: Secondary | ICD-10-CM | POA: Diagnosis not present

## 2020-05-16 DIAGNOSIS — N185 Chronic kidney disease, stage 5: Secondary | ICD-10-CM | POA: Diagnosis not present

## 2020-05-16 DIAGNOSIS — E1122 Type 2 diabetes mellitus with diabetic chronic kidney disease: Secondary | ICD-10-CM | POA: Diagnosis not present

## 2020-05-16 DIAGNOSIS — I132 Hypertensive heart and chronic kidney disease with heart failure and with stage 5 chronic kidney disease, or end stage renal disease: Secondary | ICD-10-CM | POA: Diagnosis not present

## 2020-05-18 ENCOUNTER — Other Ambulatory Visit: Payer: Self-pay

## 2020-05-18 ENCOUNTER — Ambulatory Visit (INDEPENDENT_AMBULATORY_CARE_PROVIDER_SITE_OTHER): Payer: Medicare Other | Admitting: Podiatry

## 2020-05-18 DIAGNOSIS — E1142 Type 2 diabetes mellitus with diabetic polyneuropathy: Secondary | ICD-10-CM

## 2020-05-18 DIAGNOSIS — L97511 Non-pressure chronic ulcer of other part of right foot limited to breakdown of skin: Secondary | ICD-10-CM | POA: Diagnosis not present

## 2020-05-18 DIAGNOSIS — E08621 Diabetes mellitus due to underlying condition with foot ulcer: Secondary | ICD-10-CM | POA: Diagnosis not present

## 2020-05-18 NOTE — Patient Instructions (Signed)
Wound is healed. Wear the insert and shoes while walking

## 2020-05-19 NOTE — Progress Notes (Signed)
  Subjective:  Patient ID: Angel Barton, male    DOB: [birth year],  MRN: [id number]  Chief Complaint  Patient presents with  . Wound Check    Pt states healing well without any concerns.    86 y.o. male presents with the above complaint. History confirmed with patient. Doing well, has been wearing surgical shoe. Has his previous diabetic shoes with him today  Objective:  Physical Exam: Right foot is warm and well perfused. Callus over wound, on debridement reveals a healed wound.       Assessment:   1. Diabetic ulcer of other part of right foot associated with diabetes mellitus due to underlying condition, limited to breakdown of skin ([place])   2. Diabetic peripheral neuropathy associated with type 2 diabetes mellitus ([place])      Plan:  Patient was evaluated and treated and all questions answered.  -Aperture pad was adjusted and trimmed to fit on the insert in the right shoe insert. Advised to start walking in this, monitor for signs of recurrence / worsening.  - F/U in 3 weeks if concerns, if no issues will see at his routine 3 month visit with Dr Angel Barton, DPM    Return in about 3 weeks (around 06/08/2020).

## 2020-05-22 DIAGNOSIS — N401 Enlarged prostate with lower urinary tract symptoms: Secondary | ICD-10-CM | POA: Diagnosis not present

## 2020-05-22 DIAGNOSIS — N185 Chronic kidney disease, stage 5: Secondary | ICD-10-CM | POA: Diagnosis not present

## 2020-05-22 DIAGNOSIS — I5032 Chronic diastolic (congestive) heart failure: Secondary | ICD-10-CM | POA: Diagnosis not present

## 2020-05-22 DIAGNOSIS — E1122 Type 2 diabetes mellitus with diabetic chronic kidney disease: Secondary | ICD-10-CM | POA: Diagnosis not present

## 2020-05-22 DIAGNOSIS — I132 Hypertensive heart and chronic kidney disease with heart failure and with stage 5 chronic kidney disease, or end stage renal disease: Secondary | ICD-10-CM | POA: Diagnosis not present

## 2020-05-22 DIAGNOSIS — R2689 Other abnormalities of gait and mobility: Secondary | ICD-10-CM | POA: Diagnosis not present

## 2020-05-23 DIAGNOSIS — I5032 Chronic diastolic (congestive) heart failure: Secondary | ICD-10-CM | POA: Diagnosis not present

## 2020-05-23 DIAGNOSIS — R2689 Other abnormalities of gait and mobility: Secondary | ICD-10-CM | POA: Diagnosis not present

## 2020-05-23 DIAGNOSIS — E1122 Type 2 diabetes mellitus with diabetic chronic kidney disease: Secondary | ICD-10-CM | POA: Diagnosis not present

## 2020-05-23 DIAGNOSIS — I132 Hypertensive heart and chronic kidney disease with heart failure and with stage 5 chronic kidney disease, or end stage renal disease: Secondary | ICD-10-CM | POA: Diagnosis not present

## 2020-05-23 DIAGNOSIS — N185 Chronic kidney disease, stage 5: Secondary | ICD-10-CM | POA: Diagnosis not present

## 2020-05-23 DIAGNOSIS — N401 Enlarged prostate with lower urinary tract symptoms: Secondary | ICD-10-CM | POA: Diagnosis not present

## 2020-05-25 DIAGNOSIS — Z7982 Long term (current) use of aspirin: Secondary | ICD-10-CM | POA: Diagnosis not present

## 2020-05-25 DIAGNOSIS — N401 Enlarged prostate with lower urinary tract symptoms: Secondary | ICD-10-CM | POA: Diagnosis not present

## 2020-05-25 DIAGNOSIS — Z87891 Personal history of nicotine dependence: Secondary | ICD-10-CM | POA: Diagnosis not present

## 2020-05-25 DIAGNOSIS — Z952 Presence of prosthetic heart valve: Secondary | ICD-10-CM | POA: Diagnosis not present

## 2020-05-25 DIAGNOSIS — F419 Anxiety disorder, unspecified: Secondary | ICD-10-CM | POA: Diagnosis not present

## 2020-05-25 DIAGNOSIS — I5032 Chronic diastolic (congestive) heart failure: Secondary | ICD-10-CM | POA: Diagnosis not present

## 2020-05-25 DIAGNOSIS — Z9181 History of falling: Secondary | ICD-10-CM | POA: Diagnosis not present

## 2020-05-25 DIAGNOSIS — I132 Hypertensive heart and chronic kidney disease with heart failure and with stage 5 chronic kidney disease, or end stage renal disease: Secondary | ICD-10-CM | POA: Diagnosis not present

## 2020-05-25 DIAGNOSIS — R2689 Other abnormalities of gait and mobility: Secondary | ICD-10-CM | POA: Diagnosis not present

## 2020-05-25 DIAGNOSIS — F988 Other specified behavioral and emotional disorders with onset usually occurring in childhood and adolescence: Secondary | ICD-10-CM | POA: Diagnosis not present

## 2020-05-25 DIAGNOSIS — Z7984 Long term (current) use of oral hypoglycemic drugs: Secondary | ICD-10-CM | POA: Diagnosis not present

## 2020-05-25 DIAGNOSIS — E1122 Type 2 diabetes mellitus with diabetic chronic kidney disease: Secondary | ICD-10-CM | POA: Diagnosis not present

## 2020-05-25 DIAGNOSIS — N185 Chronic kidney disease, stage 5: Secondary | ICD-10-CM | POA: Diagnosis not present

## 2020-05-30 DIAGNOSIS — R2689 Other abnormalities of gait and mobility: Secondary | ICD-10-CM | POA: Diagnosis not present

## 2020-05-30 DIAGNOSIS — N185 Chronic kidney disease, stage 5: Secondary | ICD-10-CM | POA: Diagnosis not present

## 2020-05-30 DIAGNOSIS — I132 Hypertensive heart and chronic kidney disease with heart failure and with stage 5 chronic kidney disease, or end stage renal disease: Secondary | ICD-10-CM | POA: Diagnosis not present

## 2020-05-30 DIAGNOSIS — E1122 Type 2 diabetes mellitus with diabetic chronic kidney disease: Secondary | ICD-10-CM | POA: Diagnosis not present

## 2020-05-30 DIAGNOSIS — N401 Enlarged prostate with lower urinary tract symptoms: Secondary | ICD-10-CM | POA: Diagnosis not present

## 2020-05-30 DIAGNOSIS — I5032 Chronic diastolic (congestive) heart failure: Secondary | ICD-10-CM | POA: Diagnosis not present

## 2020-05-31 DIAGNOSIS — R2689 Other abnormalities of gait and mobility: Secondary | ICD-10-CM | POA: Diagnosis not present

## 2020-05-31 DIAGNOSIS — E1122 Type 2 diabetes mellitus with diabetic chronic kidney disease: Secondary | ICD-10-CM | POA: Diagnosis not present

## 2020-05-31 DIAGNOSIS — I5032 Chronic diastolic (congestive) heart failure: Secondary | ICD-10-CM | POA: Diagnosis not present

## 2020-05-31 DIAGNOSIS — I132 Hypertensive heart and chronic kidney disease with heart failure and with stage 5 chronic kidney disease, or end stage renal disease: Secondary | ICD-10-CM | POA: Diagnosis not present

## 2020-05-31 DIAGNOSIS — N401 Enlarged prostate with lower urinary tract symptoms: Secondary | ICD-10-CM | POA: Diagnosis not present

## 2020-05-31 DIAGNOSIS — N185 Chronic kidney disease, stage 5: Secondary | ICD-10-CM | POA: Diagnosis not present

## 2020-06-01 NOTE — Progress Notes (Signed)
Date:  06/04/2020   ID:  Angel Barton, DOB 1926-05-01, MRN [id number]  PCP:  Lyman Bishop, DO  Cardiologist:  Fransico Him, MD  Electrophysiologist:  None   Evaluation Performed:  Follow-Up Visit  Chief Complaint:  f/u  History of Present Illness:    Angel Barton is a 86 y.o. male with with history of severe aortic stenosis status post TAVR 2018 in [place], hypertension, HLD, LBBB, CKD.  Echo 02/2018 normal LVEF 55 to 60% with grade 1 DD and normal valve function.  NST 09/2018 small scar in the apical inferior wall no peri-infarct ischemia normal LVEF low risk study   He was seen in our office 02/06/2020 by Melina Copa, PA-C for lower extremity edema.  He was given Lasix 20 mg prior to that visit and had taken 2 doses-she continued Lasix 20 mg daily.  BNP was 951, creatinine 0.91, hemoglobin 10.8.  2D echo 02/13/20 normal LVEF 55 to 60% with grade 2 DD, mild mitral stenosis and stable TAVR.   I saw the patient 02/15/2020 and was still having edema on Lasix 40 mg daily. He was watching his sodium intake much more closely. The importance of being compliant with compression stockings daily and 2 g sodium diet were stressed and Lasix was increased to 60 mg daily for 2 days then back to 40 mg daily.   Telemedicine visit with patient 02/21/2020 legs were still swollen.  Patient was not keeping them elevated.  Son sent a picture of his legs through my chart and he had some edema but not very significant.  He was in the ER in June with syncope.  Apparently his wife stepped out of the house for about 15 min and when she came back the patient was passed out.  EMS was called and BP was 72/88mmHg and given IVF.  He had been taking prescribed diuretic dose but not keeping up with oral fluid intake.  He was admitted and head CT was benign, hsTrop was 109 and BNP 312.  TTE showed stable TAVR and normal LVF.  His BB was held due to bradycardia and hypotension and there was concern that he may have  accidentally taken his meds twice. His atenolol was stopped.  He is here today for followup and is doing well.  He denies any chest pain or pressure, SOB, DOE, PND, orthopnea, LE edema, dizziness, palpitations or syncope. He is compliant with his meds and is tolerating meds with no SE.    Past Medical History:  Diagnosis Date  . ADD (attention deficit disorder)   . Anxiety   . Arthritis    left leg  . BPH (benign prostatic hyperplasia)   . CKD (chronic kidney disease), stage V ([place]) 08/14/2018   pt unaware  . Depression   . Diabetes mellitus without complication ([place])   . Does use hearing aid   . GERD (gastroesophageal reflux disease)   . Grade I diastolic dysfunction 19/14/7829   Noted on ECHO  . Hyperlipidemia   . Hypertension   . Kidney stones   . LAE (left atrial enlargement) 02/15/2018   Moderate, Noted on ECHO  . Left bundle branch block 08/13/2018   Noted on EKG   . LVH (left ventricular hypertrophy) 02/15/2018   Mild, Noted on ECHO  . Nephrolithiasis 08/14/2018  . Nocturia   . Pedal edema 02/2018  . Urinary retention 08/14/2018   Past Surgical History:  Procedure Laterality Date  . AORTIC VALVE REPLACEMENT  06/26/2017  .  JOINT REPLACEMENT    . right hip replacement  1994  . TRANSURETHRAL RESECTION OF PROSTATE N/A 09/20/2018   Procedure: TRANSURETHRAL RESECTION OF THE PROSTATE (TURP);  Surgeon: Lucas Mallow, MD;  Location: [facility];  Service: Urology;  Laterality: N/A;     Current Meds  Medication Sig  . Cyanocobalamin (VITAMIN B 12 PO) Take by mouth daily.  . ferrous sulfate 325 (65 FE) MG tablet Take 325 mg by mouth daily with breakfast.  . furosemide (LASIX) 40 MG tablet Take 1 tablet (40 mg total) by mouth daily.  . metFORMIN (GLUCOPHAGE-XR) 500 MG 24 hr tablet Take 500 mg by mouth daily with breakfast.  . methylphenidate (RITALIN) 20 MG tablet Take 10 mg by mouth daily.   . potassium chloride SA (KLOR-CON) 20 MEQ tablet Take 1.5 tablets (30 mEq total)  by mouth daily.  . simvastatin (ZOCOR) 20 MG tablet Take 20 mg by mouth every morning.   . terazosin (HYTRIN) 10 MG capsule Take 10 mg by mouth at bedtime.     Allergies:   Patient has no known allergies.   Social History   Tobacco Use  . Smoking status: Former Research scientist (life sciences)  . Smokeless tobacco: Never Used  Vaping Use  . Vaping Use: Never used  Substance Use Topics  . Alcohol use: Never  . Drug use: Never     Family Hx: The patient's family history includes Prostate cancer in his father.  ROS:   Please see the history of present illness.      All other systems reviewed and are negative.   Prior CV studies:   The following studies were reviewed today:    2D echo 5/3//2021IMPRESSIONS     1. Left ventricular ejection fraction, by estimation, is 55 to 60%. The  left ventricle has normal function. The left ventricle has no regional  wall motion abnormalities. There is mild left ventricular hypertrophy.  Left ventricular diastolic parameters  are consistent with Grade II diastolic dysfunction (pseudonormalization).   2. Right ventricular systolic function is normal. The right ventricular  size is normal.   3. Left atrial size was mildly dilated.   4. Right atrial size was mildly dilated.   5. The mitral valve and annulus were calcified. Trivial mitral valve  regurgitation. Mild mitral stenosis. Mean gradient 6 mmHg, MVA by PHT 2.12  cm^2.   6. Bioprosthetic aortic valve s/p TAVR. Mean gradient 8 mmHg, no  significant stenosis. I was unable to visualize aortic insufficiency.  Procedure Date: 2018.   7. IVC not visualized, peak RV-RA gradient 27 mmHg.   Comparison(s): 02/15/18 EF 55-60%. AV 98mmHg mean PG, 18mmHg peak PG.        2D Echo 02/2018 Study Conclusions    - Left ventricle: The cavity size was normal. Wall thickness was    increased in a pattern of mild LVH. Systolic function was normal.    The estimated ejection fraction was in the range of 55% to 60%.    Wall  motion was normal; there were no regional wall motion    abnormalities. Doppler parameters are consistent with abnormal    left ventricular relaxation (grade 1 diastolic dysfunction).  - Mitral valve: Calcified annulus.  - Left atrium: The atrium was mildly to moderately dilated.    Impressions:    - Normal LVEF.    MIld LVH.    MOderate LAE.    S/P TAVR with normal valve function (Peak/Mean 16/8,,Hg).    NST 09/2018  Nuclear stress EF: 65%.            Defect 1: There is a small defect of severe severity present in the apical inferior location.            Findings consistent with prior myocardial infarction.            This is a low risk study.            The left ventricular ejection fraction is normal (55-65%).   There is a small scar in the apical inferior wall with no peri-infarct ischemia. Normal LVEF. Low risk study.            Labs/Other Tests and Data Reviewed:    EKG:  No ECG reviewed.  Recent Labs: 02/06/2020: NT-Pro BNP 951 03/17/2020: B Natriuretic Peptide 312.3; TSH 1.997 03/18/2020: ALT 16; BUN 26; Creatinine, Ser 1.00; Hemoglobin 10.1; Platelets 157; Potassium 3.5; Sodium 137   Recent Lipid Panel No results found for: CHOL, TRIG, HDL, CHOLHDL, LDLCALC, LDLDIRECT  Wt Readings from Last 3 Encounters:  06/04/20 154 lb (69.9 kg)  03/18/20 156 lb 12 oz (71.1 kg)  03/06/20 159 lb (72.1 kg)     Objective:    Vital Signs:  BP 122/60   Pulse (!) 57   Ht 5\' 7"  (1.702 m)   Wt 154 lb (69.9 kg)   SpO2 97%   BMI 24.12 kg/m    GEN: Well nourished, well developed in no acute distress HEENT: Normal NECK: No JVD; No carotid bruits LYMPHATICS: No lymphadenopathy CARDIAC:RRR, no  rubs, gallops.  1/6 SM at RUSB RESPIRATORY:  Clear to auscultation without rales, wheezing or rhonchi  ABDOMEN: Soft, non-tender, non-distended MUSCULOSKELETAL:  1+ LLE edema; No deformity  SKIN: Warm and dry NEUROLOGIC:  Alert and oriented x 3 PSYCHIATRIC:  Normal affect     ASSESSMENT & PLAN:    1.  Chronic diastolic CHF -he appears euvolemic -he has chronic LE edema which is multifactorial from sedentary state, noncompliance with low Na and compression hose.  He has 1+ LLE edema today but not wearing his compression hose -2D echo normal LVEF and stable TAVR 03/2020 -continue Lasix 20mg  daily and compression hose  2.  Severe AS -s/p TAVR in 2018 in [place]  -2D echo 03/2020 with normal LVF and normal TAVR function   3.  HTN -BP controlled -continue Terazosin 10mg  daily  4.  Chronic LBBB -denies any chest pain  5.  Syncope -recent syncope felt related to poor oral fluid intake in setting of diuretic therapy for LE edema as well as possibly taking more than prescribed medication -Atenolol stopped -no further dizziness -encouraged to wear compression hose during the day     Medication Adjustments/Labs and Tests Ordered: Current medicines are reviewed at length with the patient today.  Concerns regarding medicines are outlined above.   Tests Ordered: No orders of the defined types were placed in this encounter.   Medication Changes: No orders of the defined types were placed in this encounter.   Follow Up: 6 months with TT  Signed, Fransico Him, MD  06/04/2020 11:13 AM    [facility]

## 2020-06-04 ENCOUNTER — Other Ambulatory Visit: Payer: Self-pay

## 2020-06-04 ENCOUNTER — Encounter: Payer: Self-pay | Admitting: Cardiology

## 2020-06-04 ENCOUNTER — Ambulatory Visit (INDEPENDENT_AMBULATORY_CARE_PROVIDER_SITE_OTHER): Payer: Medicare Other | Admitting: Cardiology

## 2020-06-04 VITALS — BP 122/60 | HR 57 | Ht 67.0 in | Wt 154.0 lb

## 2020-06-04 DIAGNOSIS — I350 Nonrheumatic aortic (valve) stenosis: Secondary | ICD-10-CM | POA: Diagnosis not present

## 2020-06-04 DIAGNOSIS — I5032 Chronic diastolic (congestive) heart failure: Secondary | ICD-10-CM | POA: Diagnosis not present

## 2020-06-04 DIAGNOSIS — I447 Left bundle-branch block, unspecified: Secondary | ICD-10-CM | POA: Diagnosis not present

## 2020-06-04 DIAGNOSIS — E1122 Type 2 diabetes mellitus with diabetic chronic kidney disease: Secondary | ICD-10-CM | POA: Diagnosis not present

## 2020-06-04 DIAGNOSIS — R2689 Other abnormalities of gait and mobility: Secondary | ICD-10-CM | POA: Diagnosis not present

## 2020-06-04 DIAGNOSIS — R55 Syncope and collapse: Secondary | ICD-10-CM | POA: Diagnosis not present

## 2020-06-04 DIAGNOSIS — I10 Essential (primary) hypertension: Secondary | ICD-10-CM | POA: Diagnosis not present

## 2020-06-04 DIAGNOSIS — I132 Hypertensive heart and chronic kidney disease with heart failure and with stage 5 chronic kidney disease, or end stage renal disease: Secondary | ICD-10-CM | POA: Diagnosis not present

## 2020-06-04 DIAGNOSIS — N185 Chronic kidney disease, stage 5: Secondary | ICD-10-CM | POA: Diagnosis not present

## 2020-06-04 DIAGNOSIS — N401 Enlarged prostate with lower urinary tract symptoms: Secondary | ICD-10-CM | POA: Diagnosis not present

## 2020-06-04 NOTE — Patient Instructions (Signed)

## 2020-06-13 DIAGNOSIS — I5032 Chronic diastolic (congestive) heart failure: Secondary | ICD-10-CM | POA: Diagnosis not present

## 2020-06-13 DIAGNOSIS — E1122 Type 2 diabetes mellitus with diabetic chronic kidney disease: Secondary | ICD-10-CM | POA: Diagnosis not present

## 2020-06-13 DIAGNOSIS — I132 Hypertensive heart and chronic kidney disease with heart failure and with stage 5 chronic kidney disease, or end stage renal disease: Secondary | ICD-10-CM | POA: Diagnosis not present

## 2020-06-13 DIAGNOSIS — N401 Enlarged prostate with lower urinary tract symptoms: Secondary | ICD-10-CM | POA: Diagnosis not present

## 2020-06-13 DIAGNOSIS — R2689 Other abnormalities of gait and mobility: Secondary | ICD-10-CM | POA: Diagnosis not present

## 2020-06-13 DIAGNOSIS — N185 Chronic kidney disease, stage 5: Secondary | ICD-10-CM | POA: Diagnosis not present

## 2020-06-24 DIAGNOSIS — Z7982 Long term (current) use of aspirin: Secondary | ICD-10-CM | POA: Diagnosis not present

## 2020-06-24 DIAGNOSIS — F988 Other specified behavioral and emotional disorders with onset usually occurring in childhood and adolescence: Secondary | ICD-10-CM | POA: Diagnosis not present

## 2020-06-24 DIAGNOSIS — I132 Hypertensive heart and chronic kidney disease with heart failure and with stage 5 chronic kidney disease, or end stage renal disease: Secondary | ICD-10-CM | POA: Diagnosis not present

## 2020-06-24 DIAGNOSIS — Z9181 History of falling: Secondary | ICD-10-CM | POA: Diagnosis not present

## 2020-06-24 DIAGNOSIS — R2689 Other abnormalities of gait and mobility: Secondary | ICD-10-CM | POA: Diagnosis not present

## 2020-06-24 DIAGNOSIS — Z952 Presence of prosthetic heart valve: Secondary | ICD-10-CM | POA: Diagnosis not present

## 2020-06-24 DIAGNOSIS — N185 Chronic kidney disease, stage 5: Secondary | ICD-10-CM | POA: Diagnosis not present

## 2020-06-24 DIAGNOSIS — I5032 Chronic diastolic (congestive) heart failure: Secondary | ICD-10-CM | POA: Diagnosis not present

## 2020-06-24 DIAGNOSIS — E1122 Type 2 diabetes mellitus with diabetic chronic kidney disease: Secondary | ICD-10-CM | POA: Diagnosis not present

## 2020-06-24 DIAGNOSIS — Z87891 Personal history of nicotine dependence: Secondary | ICD-10-CM | POA: Diagnosis not present

## 2020-06-24 DIAGNOSIS — N401 Enlarged prostate with lower urinary tract symptoms: Secondary | ICD-10-CM | POA: Diagnosis not present

## 2020-06-24 DIAGNOSIS — Z7984 Long term (current) use of oral hypoglycemic drugs: Secondary | ICD-10-CM | POA: Diagnosis not present

## 2020-06-24 DIAGNOSIS — F419 Anxiety disorder, unspecified: Secondary | ICD-10-CM | POA: Diagnosis not present

## 2020-06-25 DIAGNOSIS — I5032 Chronic diastolic (congestive) heart failure: Secondary | ICD-10-CM | POA: Diagnosis not present

## 2020-06-25 DIAGNOSIS — N401 Enlarged prostate with lower urinary tract symptoms: Secondary | ICD-10-CM | POA: Diagnosis not present

## 2020-06-25 DIAGNOSIS — N185 Chronic kidney disease, stage 5: Secondary | ICD-10-CM | POA: Diagnosis not present

## 2020-06-25 DIAGNOSIS — I132 Hypertensive heart and chronic kidney disease with heart failure and with stage 5 chronic kidney disease, or end stage renal disease: Secondary | ICD-10-CM | POA: Diagnosis not present

## 2020-06-25 DIAGNOSIS — R2689 Other abnormalities of gait and mobility: Secondary | ICD-10-CM | POA: Diagnosis not present

## 2020-06-25 DIAGNOSIS — E1122 Type 2 diabetes mellitus with diabetic chronic kidney disease: Secondary | ICD-10-CM | POA: Diagnosis not present

## 2020-07-10 DIAGNOSIS — N185 Chronic kidney disease, stage 5: Secondary | ICD-10-CM | POA: Diagnosis not present

## 2020-07-10 DIAGNOSIS — I5032 Chronic diastolic (congestive) heart failure: Secondary | ICD-10-CM | POA: Diagnosis not present

## 2020-07-10 DIAGNOSIS — R2689 Other abnormalities of gait and mobility: Secondary | ICD-10-CM | POA: Diagnosis not present

## 2020-07-10 DIAGNOSIS — I132 Hypertensive heart and chronic kidney disease with heart failure and with stage 5 chronic kidney disease, or end stage renal disease: Secondary | ICD-10-CM | POA: Diagnosis not present

## 2020-07-10 DIAGNOSIS — N401 Enlarged prostate with lower urinary tract symptoms: Secondary | ICD-10-CM | POA: Diagnosis not present

## 2020-07-10 DIAGNOSIS — E1122 Type 2 diabetes mellitus with diabetic chronic kidney disease: Secondary | ICD-10-CM | POA: Diagnosis not present

## 2020-07-27 DIAGNOSIS — Z23 Encounter for immunization: Secondary | ICD-10-CM | POA: Diagnosis not present

## 2020-08-17 ENCOUNTER — Ambulatory Visit: Payer: Medicare Other | Admitting: Podiatry

## 2020-08-31 ENCOUNTER — Other Ambulatory Visit: Payer: Self-pay

## 2020-08-31 ENCOUNTER — Ambulatory Visit (INDEPENDENT_AMBULATORY_CARE_PROVIDER_SITE_OTHER): Payer: Medicare Other | Admitting: Podiatry

## 2020-08-31 DIAGNOSIS — B351 Tinea unguium: Secondary | ICD-10-CM

## 2020-08-31 DIAGNOSIS — E1169 Type 2 diabetes mellitus with other specified complication: Secondary | ICD-10-CM

## 2020-08-31 DIAGNOSIS — E1142 Type 2 diabetes mellitus with diabetic polyneuropathy: Secondary | ICD-10-CM

## 2020-08-31 NOTE — Progress Notes (Signed)
  Subjective:  Patient ID: Angel Barton, male    DOB: 08/31/1926,  MRN: [id number]  Chief Complaint  Patient presents with  . Nail Problem    Nail trim 1-5 bilateral  . Callouses    Right plantar midfoot   86 y.o. male presents with the above complaint. History confirmed with patient.   Objective:  Physical Exam: warm, good capillary refill, nail exam onychomycosis of the toenails and pain to palpation, no trophic changes or ulcerative lesions, normal DP and PT pulses, and absent protective sensation. HPK right 5th met plantar  No images are attached to the encounter.  Assessment:   1. Onychomycosis of multiple toenails with type 2 diabetes mellitus and peripheral neuropathy (Angel Barton)    Plan:  Patient was evaluated and treated and all questions answered.  Diabetes with DPN, Onychomycosis -At risk foot care provided as below  Procedure: Nail Debridement Type of Debridement: manual, sharp debridement. Instrumentation: Nail nipper, rotary burr. Number of Nails: 10  No follow-ups on file.

## 2020-12-14 ENCOUNTER — Other Ambulatory Visit: Payer: Self-pay

## 2020-12-14 ENCOUNTER — Ambulatory Visit (INDEPENDENT_AMBULATORY_CARE_PROVIDER_SITE_OTHER): Payer: Medicare Other | Admitting: Podiatry

## 2020-12-14 DIAGNOSIS — E1142 Type 2 diabetes mellitus with diabetic polyneuropathy: Secondary | ICD-10-CM | POA: Diagnosis not present

## 2020-12-14 DIAGNOSIS — B351 Tinea unguium: Secondary | ICD-10-CM

## 2020-12-14 DIAGNOSIS — E1169 Type 2 diabetes mellitus with other specified complication: Secondary | ICD-10-CM

## 2021-01-10 NOTE — Progress Notes (Signed)
  Subjective:  Patient ID: Ahmed Prima, male    DOB: [birth year],  MRN: [id number]  Chief Complaint  Patient presents with  . routine foot care    Nail trimming    87 y.o. male presents with the above complaint. History confirmed with patient.   Objective:  Physical Exam: warm, good capillary refill, nail exam onychomycosis of the toenails and pain to palpation, no trophic changes or ulcerative lesions, normal DP and PT pulses, and absent protective sensation. HPK right 5th met plantar  No images are attached to the encounter.  Assessment:   1. Onychomycosis of multiple toenails with type 2 diabetes mellitus and peripheral neuropathy ([place])    Plan:  Patient was evaluated and treated and all questions answered.  Diabetes with DPN, Onychomycosis -At risk foot care provided as below  Procedure: Nail Debridement Type of Debridement: manual, sharp debridement. Instrumentation: Nail nipper, rotary burr. Number of Nails: 10     No follow-ups on file.

## 2021-01-14 MED ORDER — SIMVASTATIN 20 MG PO TABS: 20.0000 mg | ORAL_TABLET | ORAL | 1 refills | Status: AC

## 2021-02-04 DIAGNOSIS — R195 Other fecal abnormalities: Secondary | ICD-10-CM | POA: Diagnosis not present

## 2021-02-18 ENCOUNTER — Other Ambulatory Visit: Payer: Self-pay

## 2021-02-18 ENCOUNTER — Encounter (HOSPITAL_BASED_OUTPATIENT_CLINIC_OR_DEPARTMENT_OTHER): Payer: Self-pay

## 2021-02-18 ENCOUNTER — Emergency Department (HOSPITAL_BASED_OUTPATIENT_CLINIC_OR_DEPARTMENT_OTHER)
Admission: EM | Admit: 2021-02-18 | Discharge: 2021-02-18 | Disposition: A | Discharge status: Home / Self Care | Payer: Medicare Other | Attending: Emergency Medicine | Admitting: Emergency Medicine

## 2021-02-18 DIAGNOSIS — Z79899 Other long term (current) drug therapy: Secondary | ICD-10-CM | POA: Insufficient documentation

## 2021-02-18 DIAGNOSIS — I120 Hypertensive chronic kidney disease with stage 5 chronic kidney disease or end stage renal disease: Secondary | ICD-10-CM | POA: Insufficient documentation

## 2021-02-18 DIAGNOSIS — E1122 Type 2 diabetes mellitus with diabetic chronic kidney disease: Secondary | ICD-10-CM | POA: Insufficient documentation

## 2021-02-18 DIAGNOSIS — Z87891 Personal history of nicotine dependence: Secondary | ICD-10-CM | POA: Insufficient documentation

## 2021-02-18 DIAGNOSIS — Z96641 Presence of right artificial hip joint: Secondary | ICD-10-CM | POA: Insufficient documentation

## 2021-02-18 DIAGNOSIS — K5900 Constipation, unspecified: Secondary | ICD-10-CM | POA: Diagnosis present

## 2021-02-18 DIAGNOSIS — Z7984 Long term (current) use of oral hypoglycemic drugs: Secondary | ICD-10-CM | POA: Diagnosis not present

## 2021-02-18 DIAGNOSIS — K5641 Fecal impaction: Secondary | ICD-10-CM | POA: Diagnosis not present

## 2021-02-18 DIAGNOSIS — N185 Chronic kidney disease, stage 5: Secondary | ICD-10-CM | POA: Insufficient documentation

## 2021-02-18 MED ORDER — FLEET ENEMA 7-19 GM/118ML RE ENEM
1.0000 | ENEMA | Freq: Once | RECTAL | Status: AC
  Administered 2021-02-18: 1 via RECTAL
  Filled 2021-02-18: qty 1

## 2021-02-18 NOTE — ED Notes (Signed)
Pt given enema  Some fluid came out , pt was placed on bedpan

## 2021-02-18 NOTE — ED Triage Notes (Signed)
Per pt and son-in-law-pt c/o constipation x 1 week-no relief with OTC meds-NAD-to triage in w/c

## 2021-02-18 NOTE — Discharge Instructions (Signed)
Take 8 scoops of miralax in 32oz of whatever you would like to drink.(Gatorade comes in this size) You can also use a fleets enema which you can buy over the counter at the pharmacy.  Return for worsening abdominal pain, vomiting or fever. ? ?

## 2021-02-18 NOTE — ED Provider Notes (Signed)
[facility] EMERGENCY DEPARTMENT Provider Note   CSN: [id number] Arrival date & time: 02/18/21  1057     History Chief Complaint  Patient presents with  . Constipation    Angel Barton is a 87 y.o. male.  87 yo M with a chief complaint of constipation.  The family states that he had his teeth removed and then was on a soft diet and then had some profuse diarrhea followed by now 2 episodes of constipation.  Bursa lasted for about 5 days and then he had some good output after using laxatives.  This 1 is been going on for about 5 days as well.  Having some encopresis at home.  No abdominal pain no vomiting.  No recent medication changes.  The history is provided by the patient and a relative.  Constipation Associated symptoms: no abdominal pain, no diarrhea, no fever and no vomiting   Illness Severity:  Moderate Onset quality:  Gradual Duration:  5 days Timing:  Constant Progression:  Worsening Chronicity:  New Associated symptoms: no abdominal pain, no chest pain, no congestion, no diarrhea, no fever, no headaches, no myalgias, no rash, no shortness of breath and no vomiting        Past Medical History:  Diagnosis Date  . ADD (attention deficit disorder)   . Anxiety   . Arthritis    left leg  . BPH (benign prostatic hyperplasia)   . CKD (chronic kidney disease), stage V ([place]) 08/14/2018   pt unaware  . Depression   . Diabetes mellitus without complication ([place])   . Does use hearing aid   . GERD (gastroesophageal reflux disease)   . Grade I diastolic dysfunction 62/69/4854   Noted on ECHO  . Hyperlipidemia   . Hypertension   . Kidney stones   . LAE (left atrial enlargement) 02/15/2018   Moderate, Noted on ECHO  . Left bundle branch block 08/13/2018   Noted on EKG   . LVH (left ventricular hypertrophy) 02/15/2018   Mild, Noted on ECHO  . Nephrolithiasis 08/14/2018  . Nocturia   . Pedal edema 02/2018  . Urinary retention 08/14/2018    Patient  Active Problem List   Diagnosis Date Noted  . Syncope and collapse 03/17/2020  . Bradycardia 03/17/2020  . HTN (hypertension) 03/17/2020  . Combined forms of age-related cataract of right eye 06/09/2019  . Intraoperative floppy iris syndrome (IFIS) 06/09/2019  . Combined forms of age-related cataract of left eye 06/02/2019  . Urinary retention 09/20/2018  . Preoperative cardiovascular examination 09/16/2018  . Pressure injury of skin 08/14/2018  . ARF (acute renal failure) ([place]) 08/13/2018  . Pedal edema 02/04/2018  . S/p TAVR (transcatheter aortic valve replacement), bioprosthetic 02/04/2018  . Encounter for follow-up for aortic valve replacement 01/29/2018  . Fall 01/29/2018  . Hematuria 01/29/2018    Past Surgical History:  Procedure Laterality Date  . AORTIC VALVE REPLACEMENT  06/26/2017  . JOINT REPLACEMENT    . right hip replacement  1994  . TRANSURETHRAL RESECTION OF PROSTATE N/A 09/20/2018   Procedure: TRANSURETHRAL RESECTION OF THE PROSTATE (TURP);  Surgeon: Lucas Mallow, MD;  Location: [facility] ORS;  Service: Urology;  Laterality: N/A;       Family History  Problem Relation Age of Onset  . Prostate cancer Father     Social History   Tobacco Use  . Smoking status: Former Research scientist (life sciences)  . Smokeless tobacco: Never Used  Vaping Use  . Vaping Use: Never used  Substance Use  Topics  . Alcohol use: Never  . Drug use: Never    Home Medications Prior to Admission medications   Medication Sig Start Date End Date Taking? Authorizing Provider  Cyanocobalamin (VITAMIN B 12 PO) Take by mouth daily.    [provider]  ferrous sulfate 325 (65 FE) MG tablet Take 325 mg by mouth daily with breakfast.    [provider]  furosemide (LASIX) 40 MG tablet Take 1 tablet (40 mg total) by mouth daily. 03/18/20   Arlan Organ, DO  metFORMIN (GLUCOPHAGE-XR) 500 MG 24 hr tablet Take 500 mg by mouth daily with breakfast.    [provider]  methylphenidate  (RITALIN) 20 MG tablet Take 10 mg by mouth daily.     [provider]  potassium chloride SA (KLOR-CON) 20 MEQ tablet Take 1.5 tablets (30 mEq total) by mouth daily. 02/21/20   Imogene Burn, PA-C  simvastatin (ZOCOR) 20 MG tablet Take 1 tablet (20 mg total) by mouth every morning. 01/14/21 07/13/21  Sueanne Margarita, MD  terazosin (HYTRIN) 10 MG capsule Take 10 mg by mouth at bedtime.    [provider]    Allergies    Patient has no known allergies.  Review of Systems   Review of Systems  Constitutional: Negative for chills and fever.  HENT: Negative for congestion and facial swelling.   Eyes: Negative for discharge and visual disturbance.  Respiratory: Negative for shortness of breath.   Cardiovascular: Negative for chest pain and palpitations.  Gastrointestinal: Positive for constipation. Negative for abdominal pain, diarrhea and vomiting.  Musculoskeletal: Negative for arthralgias and myalgias.  Skin: Negative for color change and rash.  Neurological: Negative for tremors, syncope and headaches.  Psychiatric/Behavioral: Negative for confusion and dysphoric mood.    Physical Exam Updated Vital Signs BP (!) 158/84   Pulse 72   Temp 97.7 F (36.5 C) (Oral)   Resp 18   Ht 5\' 7"  (1.702 m)   Wt 68 kg   SpO2 99%   BMI 23.49 kg/m   Physical Exam Vitals and nursing note reviewed.  Constitutional:      Appearance: He is well-developed.  HENT:     Head: Normocephalic and atraumatic.  Eyes:     Pupils: Pupils are equal, round, and reactive to light.  Neck:     Vascular: No JVD.  Cardiovascular:     Rate and Rhythm: Normal rate and regular rhythm.     Heart sounds: No murmur heard. No friction rub. No gallop.   Pulmonary:     Effort: No respiratory distress.     Breath sounds: No wheezing.  Abdominal:     General: There is no distension.     Tenderness: There is no abdominal tenderness. There is no guarding or rebound.  Genitourinary:    Comments:  Large amounts of clay consistency stool in the rectum Musculoskeletal:        General: Normal range of motion.     Cervical back: Normal range of motion and neck supple.  Skin:    Coloration: Skin is not pale.     Findings: No rash.  Neurological:     Mental Status: He is alert and oriented to person, place, and time.  Psychiatric:        Behavior: Behavior normal.     ED Results / Procedures / Treatments   Labs (all labs ordered are listed, but only abnormal results are displayed) Labs Reviewed - No data to display  EKG None  Radiology No results found.  Procedures Fecal disimpaction  Date/Time: 02/18/2021 12:54 PM Performed by: Deno Etienne, DO Authorized by: Deno Etienne, DO  Consent: Verbal consent obtained. Risks and benefits: risks, benefits and alternatives were discussed Consent given by: patient (relative) Patient identity confirmed: verbally with patient Time out: Immediately prior to procedure a "time out" was called to verify the correct patient, procedure, equipment, support staff and site/side marked as required. Local anesthesia used: no  Anesthesia: Local anesthesia used: no  Sedation: Patient sedated: no  Patient tolerance: patient tolerated the procedure well with no immediate complications      Medications Ordered in ED Medications  sodium phosphate (FLEET) 7-19 GM/118ML enema 1 enema (1 enema Rectal Given 02/18/21 1359)    ED Course  I have reviewed the triage vital signs and the nursing notes.  Pertinent labs & imaging results that were available during my care of the patient were reviewed by me and considered in my medical decision making (see chart for details).    MDM Rules/Calculators/A&P                          87 yo M with a chief complaints of constipation.  Patient states that he has not had a good bowel movement about 5 days time.  Has been trying over-the-counter medications without improvement.  On my exam the patient is  impacted.  Removed with some improvement.  We will give an enema as well.  Reassess.  Patient has had a large bowel movement.  Feeling better.  Will discharge home.  PCP follow-up.  2:46 PM:  I have discussed the diagnosis/risks/treatment options with the patient and believe the pt to be eligible for discharge home to follow-up with PCP. We also discussed returning to the ED immediately if new or worsening sx occur. We discussed the sx which are most concerning (e.g., sudden worsening pain, fever, inability to tolerate by mouth) that necessitate immediate return. Medications administered to the patient during their visit and any new prescriptions provided to the patient are listed below.  Medications given during this visit Medications  sodium phosphate (FLEET) 7-19 GM/118ML enema 1 enema (1 enema Rectal Given 02/18/21 1359)     The patient appears reasonably screen and/or stabilized for discharge and I doubt any other medical condition or other [facility] requiring further screening, evaluation, or treatment in the ED at this time prior to discharge.   Final Clinical Impression(s) / ED Diagnoses Final diagnoses:  Fecal impaction in rectum [facility])    Rx / DC Orders ED Discharge Orders    None       Deno Etienne, DO 02/18/21 1446

## 2021-02-26 DIAGNOSIS — Z952 Presence of prosthetic heart valve: Secondary | ICD-10-CM | POA: Diagnosis not present

## 2021-02-26 DIAGNOSIS — F988 Other specified behavioral and emotional disorders with onset usually occurring in childhood and adolescence: Secondary | ICD-10-CM | POA: Diagnosis not present

## 2021-02-26 DIAGNOSIS — I10 Essential (primary) hypertension: Secondary | ICD-10-CM | POA: Diagnosis not present

## 2021-02-26 DIAGNOSIS — R296 Repeated falls: Secondary | ICD-10-CM | POA: Diagnosis not present

## 2021-02-26 DIAGNOSIS — Z111 Encounter for screening for respiratory tuberculosis: Secondary | ICD-10-CM | POA: Diagnosis not present

## 2021-02-26 DIAGNOSIS — N401 Enlarged prostate with lower urinary tract symptoms: Secondary | ICD-10-CM | POA: Diagnosis not present

## 2021-02-26 DIAGNOSIS — Z7984 Long term (current) use of oral hypoglycemic drugs: Secondary | ICD-10-CM | POA: Diagnosis not present

## 2021-02-26 DIAGNOSIS — E1165 Type 2 diabetes mellitus with hyperglycemia: Secondary | ICD-10-CM | POA: Diagnosis not present

## 2021-02-26 DIAGNOSIS — E785 Hyperlipidemia, unspecified: Secondary | ICD-10-CM | POA: Diagnosis not present

## 2021-02-28 DIAGNOSIS — Z111 Encounter for screening for respiratory tuberculosis: Secondary | ICD-10-CM | POA: Diagnosis not present

## 2021-03-06 DIAGNOSIS — R7612 Nonspecific reaction to cell mediated immunity measurement of gamma interferon antigen response without active tuberculosis: Secondary | ICD-10-CM | POA: Diagnosis not present

## 2021-03-07 DIAGNOSIS — Z20822 Contact with and (suspected) exposure to covid-19: Secondary | ICD-10-CM | POA: Diagnosis not present

## 2021-03-19 ENCOUNTER — Ambulatory Visit: Payer: Medicare Other | Admitting: Podiatry

## 2021-03-19 DIAGNOSIS — G309 Alzheimer's disease, unspecified: Secondary | ICD-10-CM | POA: Diagnosis not present

## 2021-03-19 DIAGNOSIS — Z79899 Other long term (current) drug therapy: Secondary | ICD-10-CM | POA: Diagnosis not present

## 2021-03-19 DIAGNOSIS — F0280 Dementia in other diseases classified elsewhere without behavioral disturbance: Secondary | ICD-10-CM | POA: Diagnosis not present

## 2021-03-19 DIAGNOSIS — E1159 Type 2 diabetes mellitus with other circulatory complications: Secondary | ICD-10-CM | POA: Diagnosis not present

## 2021-03-19 DIAGNOSIS — F419 Anxiety disorder, unspecified: Secondary | ICD-10-CM | POA: Diagnosis not present

## 2021-03-19 DIAGNOSIS — E559 Vitamin D deficiency, unspecified: Secondary | ICD-10-CM | POA: Diagnosis not present

## 2021-03-19 DIAGNOSIS — E782 Mixed hyperlipidemia: Secondary | ICD-10-CM | POA: Diagnosis not present

## 2021-03-19 DIAGNOSIS — Z515 Encounter for palliative care: Secondary | ICD-10-CM | POA: Diagnosis not present

## 2021-03-19 DIAGNOSIS — D509 Iron deficiency anemia, unspecified: Secondary | ICD-10-CM | POA: Diagnosis not present

## 2021-03-19 DIAGNOSIS — K5901 Slow transit constipation: Secondary | ICD-10-CM | POA: Diagnosis not present

## 2021-03-20 DIAGNOSIS — Z79899 Other long term (current) drug therapy: Secondary | ICD-10-CM | POA: Diagnosis not present

## 2021-03-20 DIAGNOSIS — E559 Vitamin D deficiency, unspecified: Secondary | ICD-10-CM | POA: Diagnosis not present

## 2021-03-21 DIAGNOSIS — E785 Hyperlipidemia, unspecified: Secondary | ICD-10-CM | POA: Diagnosis not present

## 2021-03-21 DIAGNOSIS — Z952 Presence of prosthetic heart valve: Secondary | ICD-10-CM | POA: Diagnosis not present

## 2021-03-21 DIAGNOSIS — F988 Other specified behavioral and emotional disorders with onset usually occurring in childhood and adolescence: Secondary | ICD-10-CM | POA: Diagnosis not present

## 2021-03-21 DIAGNOSIS — R300 Dysuria: Secondary | ICD-10-CM | POA: Diagnosis not present

## 2021-03-21 DIAGNOSIS — N401 Enlarged prostate with lower urinary tract symptoms: Secondary | ICD-10-CM | POA: Diagnosis not present

## 2021-03-21 DIAGNOSIS — R296 Repeated falls: Secondary | ICD-10-CM | POA: Diagnosis not present

## 2021-03-21 DIAGNOSIS — R291 Meningismus: Secondary | ICD-10-CM | POA: Diagnosis not present

## 2021-03-21 DIAGNOSIS — E1165 Type 2 diabetes mellitus with hyperglycemia: Secondary | ICD-10-CM | POA: Diagnosis not present

## 2021-03-21 DIAGNOSIS — D649 Anemia, unspecified: Secondary | ICD-10-CM | POA: Diagnosis not present

## 2021-03-21 DIAGNOSIS — I10 Essential (primary) hypertension: Secondary | ICD-10-CM | POA: Diagnosis not present

## 2021-03-25 DIAGNOSIS — R296 Repeated falls: Secondary | ICD-10-CM | POA: Diagnosis not present

## 2021-03-25 DIAGNOSIS — M6281 Muscle weakness (generalized): Secondary | ICD-10-CM | POA: Diagnosis not present

## 2021-03-25 DIAGNOSIS — N3946 Mixed incontinence: Secondary | ICD-10-CM | POA: Diagnosis not present

## 2021-03-25 DIAGNOSIS — R41841 Cognitive communication deficit: Secondary | ICD-10-CM | POA: Diagnosis not present

## 2021-03-25 DIAGNOSIS — R2681 Unsteadiness on feet: Secondary | ICD-10-CM | POA: Diagnosis not present

## 2021-03-25 DIAGNOSIS — R262 Difficulty in walking, not elsewhere classified: Secondary | ICD-10-CM | POA: Diagnosis not present

## 2021-03-25 DIAGNOSIS — R488 Other symbolic dysfunctions: Secondary | ICD-10-CM | POA: Diagnosis not present

## 2021-03-26 ENCOUNTER — Ambulatory Visit: Payer: Medicare Other | Admitting: Podiatry

## 2021-03-26 DIAGNOSIS — R2681 Unsteadiness on feet: Secondary | ICD-10-CM | POA: Diagnosis not present

## 2021-03-26 DIAGNOSIS — M6281 Muscle weakness (generalized): Secondary | ICD-10-CM | POA: Diagnosis not present

## 2021-03-26 DIAGNOSIS — N3946 Mixed incontinence: Secondary | ICD-10-CM | POA: Diagnosis not present

## 2021-03-26 DIAGNOSIS — R41841 Cognitive communication deficit: Secondary | ICD-10-CM | POA: Diagnosis not present

## 2021-03-26 DIAGNOSIS — R262 Difficulty in walking, not elsewhere classified: Secondary | ICD-10-CM | POA: Diagnosis not present

## 2021-03-26 DIAGNOSIS — R296 Repeated falls: Secondary | ICD-10-CM | POA: Diagnosis not present

## 2021-04-01 DIAGNOSIS — R41841 Cognitive communication deficit: Secondary | ICD-10-CM | POA: Diagnosis not present

## 2021-04-01 DIAGNOSIS — R262 Difficulty in walking, not elsewhere classified: Secondary | ICD-10-CM | POA: Diagnosis not present

## 2021-04-01 DIAGNOSIS — N3946 Mixed incontinence: Secondary | ICD-10-CM | POA: Diagnosis not present

## 2021-04-01 DIAGNOSIS — M6281 Muscle weakness (generalized): Secondary | ICD-10-CM | POA: Diagnosis not present

## 2021-04-01 DIAGNOSIS — R296 Repeated falls: Secondary | ICD-10-CM | POA: Diagnosis not present

## 2021-04-01 DIAGNOSIS — R2681 Unsteadiness on feet: Secondary | ICD-10-CM | POA: Diagnosis not present

## 2021-04-02 DIAGNOSIS — R262 Difficulty in walking, not elsewhere classified: Secondary | ICD-10-CM | POA: Diagnosis not present

## 2021-04-02 DIAGNOSIS — M6281 Muscle weakness (generalized): Secondary | ICD-10-CM | POA: Diagnosis not present

## 2021-04-02 DIAGNOSIS — N3946 Mixed incontinence: Secondary | ICD-10-CM | POA: Diagnosis not present

## 2021-04-02 DIAGNOSIS — R296 Repeated falls: Secondary | ICD-10-CM | POA: Diagnosis not present

## 2021-04-02 DIAGNOSIS — R2681 Unsteadiness on feet: Secondary | ICD-10-CM | POA: Diagnosis not present

## 2021-04-02 DIAGNOSIS — R41841 Cognitive communication deficit: Secondary | ICD-10-CM | POA: Diagnosis not present

## 2021-04-03 DIAGNOSIS — M6281 Muscle weakness (generalized): Secondary | ICD-10-CM | POA: Diagnosis not present

## 2021-04-03 DIAGNOSIS — R2681 Unsteadiness on feet: Secondary | ICD-10-CM | POA: Diagnosis not present

## 2021-04-03 DIAGNOSIS — R41841 Cognitive communication deficit: Secondary | ICD-10-CM | POA: Diagnosis not present

## 2021-04-03 DIAGNOSIS — R262 Difficulty in walking, not elsewhere classified: Secondary | ICD-10-CM | POA: Diagnosis not present

## 2021-04-03 DIAGNOSIS — R296 Repeated falls: Secondary | ICD-10-CM | POA: Diagnosis not present

## 2021-04-03 DIAGNOSIS — E1165 Type 2 diabetes mellitus with hyperglycemia: Secondary | ICD-10-CM | POA: Diagnosis not present

## 2021-04-03 DIAGNOSIS — N3946 Mixed incontinence: Secondary | ICD-10-CM | POA: Diagnosis not present

## 2021-04-04 DIAGNOSIS — N3946 Mixed incontinence: Secondary | ICD-10-CM | POA: Diagnosis not present

## 2021-04-04 DIAGNOSIS — R262 Difficulty in walking, not elsewhere classified: Secondary | ICD-10-CM | POA: Diagnosis not present

## 2021-04-04 DIAGNOSIS — R2681 Unsteadiness on feet: Secondary | ICD-10-CM | POA: Diagnosis not present

## 2021-04-04 DIAGNOSIS — R41841 Cognitive communication deficit: Secondary | ICD-10-CM | POA: Diagnosis not present

## 2021-04-04 DIAGNOSIS — R296 Repeated falls: Secondary | ICD-10-CM | POA: Diagnosis not present

## 2021-04-04 DIAGNOSIS — M6281 Muscle weakness (generalized): Secondary | ICD-10-CM | POA: Diagnosis not present

## 2021-04-05 DIAGNOSIS — N3946 Mixed incontinence: Secondary | ICD-10-CM | POA: Diagnosis not present

## 2021-04-05 DIAGNOSIS — R41841 Cognitive communication deficit: Secondary | ICD-10-CM | POA: Diagnosis not present

## 2021-04-05 DIAGNOSIS — M6281 Muscle weakness (generalized): Secondary | ICD-10-CM | POA: Diagnosis not present

## 2021-04-05 DIAGNOSIS — R2681 Unsteadiness on feet: Secondary | ICD-10-CM | POA: Diagnosis not present

## 2021-04-05 DIAGNOSIS — R296 Repeated falls: Secondary | ICD-10-CM | POA: Diagnosis not present

## 2021-04-05 DIAGNOSIS — R262 Difficulty in walking, not elsewhere classified: Secondary | ICD-10-CM | POA: Diagnosis not present

## 2021-04-08 DIAGNOSIS — R41841 Cognitive communication deficit: Secondary | ICD-10-CM | POA: Diagnosis not present

## 2021-04-08 DIAGNOSIS — R2681 Unsteadiness on feet: Secondary | ICD-10-CM | POA: Diagnosis not present

## 2021-04-08 DIAGNOSIS — R296 Repeated falls: Secondary | ICD-10-CM | POA: Diagnosis not present

## 2021-04-08 DIAGNOSIS — M6281 Muscle weakness (generalized): Secondary | ICD-10-CM | POA: Diagnosis not present

## 2021-04-08 DIAGNOSIS — N3946 Mixed incontinence: Secondary | ICD-10-CM | POA: Diagnosis not present

## 2021-04-08 DIAGNOSIS — R262 Difficulty in walking, not elsewhere classified: Secondary | ICD-10-CM | POA: Diagnosis not present

## 2021-04-09 DIAGNOSIS — Z515 Encounter for palliative care: Secondary | ICD-10-CM | POA: Diagnosis not present

## 2021-04-09 DIAGNOSIS — F0280 Dementia in other diseases classified elsewhere without behavioral disturbance: Secondary | ICD-10-CM | POA: Diagnosis not present

## 2021-04-09 DIAGNOSIS — Z9181 History of falling: Secondary | ICD-10-CM | POA: Diagnosis not present

## 2021-04-09 DIAGNOSIS — L89311 Pressure ulcer of right buttock, stage 1: Secondary | ICD-10-CM | POA: Diagnosis not present

## 2021-04-09 DIAGNOSIS — M6281 Muscle weakness (generalized): Secondary | ICD-10-CM | POA: Diagnosis not present

## 2021-04-09 DIAGNOSIS — R262 Difficulty in walking, not elsewhere classified: Secondary | ICD-10-CM | POA: Diagnosis not present

## 2021-04-09 DIAGNOSIS — E1159 Type 2 diabetes mellitus with other circulatory complications: Secondary | ICD-10-CM | POA: Diagnosis not present

## 2021-04-09 DIAGNOSIS — R41841 Cognitive communication deficit: Secondary | ICD-10-CM | POA: Diagnosis not present

## 2021-04-09 DIAGNOSIS — R296 Repeated falls: Secondary | ICD-10-CM | POA: Diagnosis not present

## 2021-04-09 DIAGNOSIS — N3946 Mixed incontinence: Secondary | ICD-10-CM | POA: Diagnosis not present

## 2021-04-09 DIAGNOSIS — K5901 Slow transit constipation: Secondary | ICD-10-CM | POA: Diagnosis not present

## 2021-04-09 DIAGNOSIS — R2681 Unsteadiness on feet: Secondary | ICD-10-CM | POA: Diagnosis not present

## 2021-04-10 DIAGNOSIS — R2681 Unsteadiness on feet: Secondary | ICD-10-CM | POA: Diagnosis not present

## 2021-04-10 DIAGNOSIS — M6281 Muscle weakness (generalized): Secondary | ICD-10-CM | POA: Diagnosis not present

## 2021-04-10 DIAGNOSIS — R296 Repeated falls: Secondary | ICD-10-CM | POA: Diagnosis not present

## 2021-04-10 DIAGNOSIS — R41841 Cognitive communication deficit: Secondary | ICD-10-CM | POA: Diagnosis not present

## 2021-04-10 DIAGNOSIS — R262 Difficulty in walking, not elsewhere classified: Secondary | ICD-10-CM | POA: Diagnosis not present

## 2021-04-10 DIAGNOSIS — N3946 Mixed incontinence: Secondary | ICD-10-CM | POA: Diagnosis not present

## 2021-04-12 DIAGNOSIS — E785 Hyperlipidemia, unspecified: Secondary | ICD-10-CM | POA: Diagnosis not present

## 2021-04-12 DIAGNOSIS — R296 Repeated falls: Secondary | ICD-10-CM | POA: Diagnosis not present

## 2021-04-12 DIAGNOSIS — E1165 Type 2 diabetes mellitus with hyperglycemia: Secondary | ICD-10-CM | POA: Diagnosis not present

## 2021-04-12 DIAGNOSIS — R41841 Cognitive communication deficit: Secondary | ICD-10-CM | POA: Diagnosis not present

## 2021-04-12 DIAGNOSIS — R2681 Unsteadiness on feet: Secondary | ICD-10-CM | POA: Diagnosis not present

## 2021-04-12 DIAGNOSIS — M6281 Muscle weakness (generalized): Secondary | ICD-10-CM | POA: Diagnosis not present

## 2021-04-12 DIAGNOSIS — R262 Difficulty in walking, not elsewhere classified: Secondary | ICD-10-CM | POA: Diagnosis not present

## 2021-04-12 DIAGNOSIS — I10 Essential (primary) hypertension: Secondary | ICD-10-CM | POA: Diagnosis not present

## 2021-04-12 DIAGNOSIS — N3946 Mixed incontinence: Secondary | ICD-10-CM | POA: Diagnosis not present

## 2021-04-12 DIAGNOSIS — F988 Other specified behavioral and emotional disorders with onset usually occurring in childhood and adolescence: Secondary | ICD-10-CM | POA: Diagnosis not present

## 2021-04-12 DIAGNOSIS — R291 Meningismus: Secondary | ICD-10-CM | POA: Diagnosis not present

## 2021-04-12 DIAGNOSIS — R488 Other symbolic dysfunctions: Secondary | ICD-10-CM | POA: Diagnosis not present

## 2021-04-12 DIAGNOSIS — D649 Anemia, unspecified: Secondary | ICD-10-CM | POA: Diagnosis not present

## 2021-04-12 DIAGNOSIS — Z952 Presence of prosthetic heart valve: Secondary | ICD-10-CM | POA: Diagnosis not present

## 2021-04-12 DIAGNOSIS — R300 Dysuria: Secondary | ICD-10-CM | POA: Diagnosis not present

## 2021-04-12 DIAGNOSIS — N401 Enlarged prostate with lower urinary tract symptoms: Secondary | ICD-10-CM | POA: Diagnosis not present

## 2021-04-12 DIAGNOSIS — R1312 Dysphagia, oropharyngeal phase: Secondary | ICD-10-CM | POA: Diagnosis not present

## 2021-04-16 DIAGNOSIS — R41841 Cognitive communication deficit: Secondary | ICD-10-CM | POA: Diagnosis not present

## 2021-04-16 DIAGNOSIS — R262 Difficulty in walking, not elsewhere classified: Secondary | ICD-10-CM | POA: Diagnosis not present

## 2021-04-16 DIAGNOSIS — M6281 Muscle weakness (generalized): Secondary | ICD-10-CM | POA: Diagnosis not present

## 2021-04-16 DIAGNOSIS — R2681 Unsteadiness on feet: Secondary | ICD-10-CM | POA: Diagnosis not present

## 2021-04-16 DIAGNOSIS — N3946 Mixed incontinence: Secondary | ICD-10-CM | POA: Diagnosis not present

## 2021-04-16 DIAGNOSIS — R1312 Dysphagia, oropharyngeal phase: Secondary | ICD-10-CM | POA: Diagnosis not present

## 2021-04-17 DIAGNOSIS — R41841 Cognitive communication deficit: Secondary | ICD-10-CM | POA: Diagnosis not present

## 2021-04-17 DIAGNOSIS — R1312 Dysphagia, oropharyngeal phase: Secondary | ICD-10-CM | POA: Diagnosis not present

## 2021-04-17 DIAGNOSIS — R262 Difficulty in walking, not elsewhere classified: Secondary | ICD-10-CM | POA: Diagnosis not present

## 2021-04-17 DIAGNOSIS — R2681 Unsteadiness on feet: Secondary | ICD-10-CM | POA: Diagnosis not present

## 2021-04-17 DIAGNOSIS — M6281 Muscle weakness (generalized): Secondary | ICD-10-CM | POA: Diagnosis not present

## 2021-04-17 DIAGNOSIS — N3946 Mixed incontinence: Secondary | ICD-10-CM | POA: Diagnosis not present

## 2021-04-18 DIAGNOSIS — R1312 Dysphagia, oropharyngeal phase: Secondary | ICD-10-CM | POA: Diagnosis not present

## 2021-04-18 DIAGNOSIS — R2681 Unsteadiness on feet: Secondary | ICD-10-CM | POA: Diagnosis not present

## 2021-04-18 DIAGNOSIS — M6281 Muscle weakness (generalized): Secondary | ICD-10-CM | POA: Diagnosis not present

## 2021-04-18 DIAGNOSIS — N3946 Mixed incontinence: Secondary | ICD-10-CM | POA: Diagnosis not present

## 2021-04-18 DIAGNOSIS — R262 Difficulty in walking, not elsewhere classified: Secondary | ICD-10-CM | POA: Diagnosis not present

## 2021-04-18 DIAGNOSIS — R41841 Cognitive communication deficit: Secondary | ICD-10-CM | POA: Diagnosis not present

## 2021-04-19 DIAGNOSIS — R1312 Dysphagia, oropharyngeal phase: Secondary | ICD-10-CM | POA: Diagnosis not present

## 2021-04-19 DIAGNOSIS — R41841 Cognitive communication deficit: Secondary | ICD-10-CM | POA: Diagnosis not present

## 2021-04-19 DIAGNOSIS — M6281 Muscle weakness (generalized): Secondary | ICD-10-CM | POA: Diagnosis not present

## 2021-04-19 DIAGNOSIS — N3946 Mixed incontinence: Secondary | ICD-10-CM | POA: Diagnosis not present

## 2021-04-19 DIAGNOSIS — R262 Difficulty in walking, not elsewhere classified: Secondary | ICD-10-CM | POA: Diagnosis not present

## 2021-04-19 DIAGNOSIS — R2681 Unsteadiness on feet: Secondary | ICD-10-CM | POA: Diagnosis not present

## 2021-04-22 DIAGNOSIS — R1312 Dysphagia, oropharyngeal phase: Secondary | ICD-10-CM | POA: Diagnosis not present

## 2021-04-22 DIAGNOSIS — R262 Difficulty in walking, not elsewhere classified: Secondary | ICD-10-CM | POA: Diagnosis not present

## 2021-04-22 DIAGNOSIS — R41841 Cognitive communication deficit: Secondary | ICD-10-CM | POA: Diagnosis not present

## 2021-04-22 DIAGNOSIS — M6281 Muscle weakness (generalized): Secondary | ICD-10-CM | POA: Diagnosis not present

## 2021-04-22 DIAGNOSIS — R2681 Unsteadiness on feet: Secondary | ICD-10-CM | POA: Diagnosis not present

## 2021-04-22 DIAGNOSIS — N3946 Mixed incontinence: Secondary | ICD-10-CM | POA: Diagnosis not present

## 2021-04-23 DIAGNOSIS — R41841 Cognitive communication deficit: Secondary | ICD-10-CM | POA: Diagnosis not present

## 2021-04-23 DIAGNOSIS — R2681 Unsteadiness on feet: Secondary | ICD-10-CM | POA: Diagnosis not present

## 2021-04-23 DIAGNOSIS — N3946 Mixed incontinence: Secondary | ICD-10-CM | POA: Diagnosis not present

## 2021-04-23 DIAGNOSIS — M6281 Muscle weakness (generalized): Secondary | ICD-10-CM | POA: Diagnosis not present

## 2021-04-23 DIAGNOSIS — R1312 Dysphagia, oropharyngeal phase: Secondary | ICD-10-CM | POA: Diagnosis not present

## 2021-04-23 DIAGNOSIS — R262 Difficulty in walking, not elsewhere classified: Secondary | ICD-10-CM | POA: Diagnosis not present

## 2021-04-24 DIAGNOSIS — R2681 Unsteadiness on feet: Secondary | ICD-10-CM | POA: Diagnosis not present

## 2021-04-24 DIAGNOSIS — R41841 Cognitive communication deficit: Secondary | ICD-10-CM | POA: Diagnosis not present

## 2021-04-24 DIAGNOSIS — N3946 Mixed incontinence: Secondary | ICD-10-CM | POA: Diagnosis not present

## 2021-04-24 DIAGNOSIS — M6281 Muscle weakness (generalized): Secondary | ICD-10-CM | POA: Diagnosis not present

## 2021-04-24 DIAGNOSIS — R262 Difficulty in walking, not elsewhere classified: Secondary | ICD-10-CM | POA: Diagnosis not present

## 2021-04-24 DIAGNOSIS — R1312 Dysphagia, oropharyngeal phase: Secondary | ICD-10-CM | POA: Diagnosis not present

## 2021-04-25 DIAGNOSIS — M6281 Muscle weakness (generalized): Secondary | ICD-10-CM | POA: Diagnosis not present

## 2021-04-25 DIAGNOSIS — R1312 Dysphagia, oropharyngeal phase: Secondary | ICD-10-CM | POA: Diagnosis not present

## 2021-04-25 DIAGNOSIS — R41841 Cognitive communication deficit: Secondary | ICD-10-CM | POA: Diagnosis not present

## 2021-04-25 DIAGNOSIS — R262 Difficulty in walking, not elsewhere classified: Secondary | ICD-10-CM | POA: Diagnosis not present

## 2021-04-25 DIAGNOSIS — R2681 Unsteadiness on feet: Secondary | ICD-10-CM | POA: Diagnosis not present

## 2021-04-25 DIAGNOSIS — N3946 Mixed incontinence: Secondary | ICD-10-CM | POA: Diagnosis not present

## 2021-04-26 DIAGNOSIS — N3946 Mixed incontinence: Secondary | ICD-10-CM | POA: Diagnosis not present

## 2021-04-26 DIAGNOSIS — R41841 Cognitive communication deficit: Secondary | ICD-10-CM | POA: Diagnosis not present

## 2021-04-26 DIAGNOSIS — R262 Difficulty in walking, not elsewhere classified: Secondary | ICD-10-CM | POA: Diagnosis not present

## 2021-04-26 DIAGNOSIS — R1312 Dysphagia, oropharyngeal phase: Secondary | ICD-10-CM | POA: Diagnosis not present

## 2021-04-26 DIAGNOSIS — R2681 Unsteadiness on feet: Secondary | ICD-10-CM | POA: Diagnosis not present

## 2021-04-26 DIAGNOSIS — M6281 Muscle weakness (generalized): Secondary | ICD-10-CM | POA: Diagnosis not present

## 2021-04-29 DIAGNOSIS — N3946 Mixed incontinence: Secondary | ICD-10-CM | POA: Diagnosis not present

## 2021-04-29 DIAGNOSIS — R262 Difficulty in walking, not elsewhere classified: Secondary | ICD-10-CM | POA: Diagnosis not present

## 2021-04-29 DIAGNOSIS — R1312 Dysphagia, oropharyngeal phase: Secondary | ICD-10-CM | POA: Diagnosis not present

## 2021-04-29 DIAGNOSIS — R41841 Cognitive communication deficit: Secondary | ICD-10-CM | POA: Diagnosis not present

## 2021-04-29 DIAGNOSIS — R2681 Unsteadiness on feet: Secondary | ICD-10-CM | POA: Diagnosis not present

## 2021-04-29 DIAGNOSIS — M6281 Muscle weakness (generalized): Secondary | ICD-10-CM | POA: Diagnosis not present

## 2021-04-30 DIAGNOSIS — E1159 Type 2 diabetes mellitus with other circulatory complications: Secondary | ICD-10-CM | POA: Diagnosis not present

## 2021-04-30 DIAGNOSIS — N400 Enlarged prostate without lower urinary tract symptoms: Secondary | ICD-10-CM | POA: Diagnosis not present

## 2021-04-30 DIAGNOSIS — R41841 Cognitive communication deficit: Secondary | ICD-10-CM | POA: Diagnosis not present

## 2021-04-30 DIAGNOSIS — R2681 Unsteadiness on feet: Secondary | ICD-10-CM | POA: Diagnosis not present

## 2021-04-30 DIAGNOSIS — R262 Difficulty in walking, not elsewhere classified: Secondary | ICD-10-CM | POA: Diagnosis not present

## 2021-04-30 DIAGNOSIS — M6281 Muscle weakness (generalized): Secondary | ICD-10-CM | POA: Diagnosis not present

## 2021-04-30 DIAGNOSIS — Z79899 Other long term (current) drug therapy: Secondary | ICD-10-CM | POA: Diagnosis not present

## 2021-04-30 DIAGNOSIS — N3946 Mixed incontinence: Secondary | ICD-10-CM | POA: Diagnosis not present

## 2021-04-30 DIAGNOSIS — R1312 Dysphagia, oropharyngeal phase: Secondary | ICD-10-CM | POA: Diagnosis not present

## 2021-04-30 DIAGNOSIS — F0281 Dementia in other diseases classified elsewhere with behavioral disturbance: Secondary | ICD-10-CM | POA: Diagnosis not present

## 2021-04-30 DIAGNOSIS — G309 Alzheimer's disease, unspecified: Secondary | ICD-10-CM | POA: Diagnosis not present

## 2021-05-01 DIAGNOSIS — N3946 Mixed incontinence: Secondary | ICD-10-CM | POA: Diagnosis not present

## 2021-05-01 DIAGNOSIS — R1312 Dysphagia, oropharyngeal phase: Secondary | ICD-10-CM | POA: Diagnosis not present

## 2021-05-01 DIAGNOSIS — R262 Difficulty in walking, not elsewhere classified: Secondary | ICD-10-CM | POA: Diagnosis not present

## 2021-05-01 DIAGNOSIS — M6281 Muscle weakness (generalized): Secondary | ICD-10-CM | POA: Diagnosis not present

## 2021-05-01 DIAGNOSIS — R2681 Unsteadiness on feet: Secondary | ICD-10-CM | POA: Diagnosis not present

## 2021-05-01 DIAGNOSIS — R41841 Cognitive communication deficit: Secondary | ICD-10-CM | POA: Diagnosis not present

## 2021-05-02 DIAGNOSIS — N3946 Mixed incontinence: Secondary | ICD-10-CM | POA: Diagnosis not present

## 2021-05-02 DIAGNOSIS — R1312 Dysphagia, oropharyngeal phase: Secondary | ICD-10-CM | POA: Diagnosis not present

## 2021-05-02 DIAGNOSIS — R41841 Cognitive communication deficit: Secondary | ICD-10-CM | POA: Diagnosis not present

## 2021-05-02 DIAGNOSIS — R262 Difficulty in walking, not elsewhere classified: Secondary | ICD-10-CM | POA: Diagnosis not present

## 2021-05-02 DIAGNOSIS — R2681 Unsteadiness on feet: Secondary | ICD-10-CM | POA: Diagnosis not present

## 2021-05-02 DIAGNOSIS — M6281 Muscle weakness (generalized): Secondary | ICD-10-CM | POA: Diagnosis not present

## 2021-05-03 DIAGNOSIS — R262 Difficulty in walking, not elsewhere classified: Secondary | ICD-10-CM | POA: Diagnosis not present

## 2021-05-03 DIAGNOSIS — R2681 Unsteadiness on feet: Secondary | ICD-10-CM | POA: Diagnosis not present

## 2021-05-03 DIAGNOSIS — R1312 Dysphagia, oropharyngeal phase: Secondary | ICD-10-CM | POA: Diagnosis not present

## 2021-05-03 DIAGNOSIS — R41841 Cognitive communication deficit: Secondary | ICD-10-CM | POA: Diagnosis not present

## 2021-05-03 DIAGNOSIS — M6281 Muscle weakness (generalized): Secondary | ICD-10-CM | POA: Diagnosis not present

## 2021-05-03 DIAGNOSIS — N3946 Mixed incontinence: Secondary | ICD-10-CM | POA: Diagnosis not present

## 2021-05-04 DIAGNOSIS — R1312 Dysphagia, oropharyngeal phase: Secondary | ICD-10-CM | POA: Diagnosis not present

## 2021-05-04 DIAGNOSIS — R41841 Cognitive communication deficit: Secondary | ICD-10-CM | POA: Diagnosis not present

## 2021-05-04 DIAGNOSIS — M6281 Muscle weakness (generalized): Secondary | ICD-10-CM | POA: Diagnosis not present

## 2021-05-04 DIAGNOSIS — R262 Difficulty in walking, not elsewhere classified: Secondary | ICD-10-CM | POA: Diagnosis not present

## 2021-05-04 DIAGNOSIS — N3946 Mixed incontinence: Secondary | ICD-10-CM | POA: Diagnosis not present

## 2021-05-04 DIAGNOSIS — R2681 Unsteadiness on feet: Secondary | ICD-10-CM | POA: Diagnosis not present

## 2021-05-06 DIAGNOSIS — R41841 Cognitive communication deficit: Secondary | ICD-10-CM | POA: Diagnosis not present

## 2021-05-06 DIAGNOSIS — R2681 Unsteadiness on feet: Secondary | ICD-10-CM | POA: Diagnosis not present

## 2021-05-06 DIAGNOSIS — R1312 Dysphagia, oropharyngeal phase: Secondary | ICD-10-CM | POA: Diagnosis not present

## 2021-05-06 DIAGNOSIS — N3946 Mixed incontinence: Secondary | ICD-10-CM | POA: Diagnosis not present

## 2021-05-06 DIAGNOSIS — M6281 Muscle weakness (generalized): Secondary | ICD-10-CM | POA: Diagnosis not present

## 2021-05-06 DIAGNOSIS — R262 Difficulty in walking, not elsewhere classified: Secondary | ICD-10-CM | POA: Diagnosis not present

## 2021-05-07 DIAGNOSIS — R262 Difficulty in walking, not elsewhere classified: Secondary | ICD-10-CM | POA: Diagnosis not present

## 2021-05-07 DIAGNOSIS — R2681 Unsteadiness on feet: Secondary | ICD-10-CM | POA: Diagnosis not present

## 2021-05-07 DIAGNOSIS — N3946 Mixed incontinence: Secondary | ICD-10-CM | POA: Diagnosis not present

## 2021-05-07 DIAGNOSIS — M6281 Muscle weakness (generalized): Secondary | ICD-10-CM | POA: Diagnosis not present

## 2021-05-07 DIAGNOSIS — R41841 Cognitive communication deficit: Secondary | ICD-10-CM | POA: Diagnosis not present

## 2021-05-07 DIAGNOSIS — R1312 Dysphagia, oropharyngeal phase: Secondary | ICD-10-CM | POA: Diagnosis not present

## 2021-05-08 DIAGNOSIS — R262 Difficulty in walking, not elsewhere classified: Secondary | ICD-10-CM | POA: Diagnosis not present

## 2021-05-08 DIAGNOSIS — M6281 Muscle weakness (generalized): Secondary | ICD-10-CM | POA: Diagnosis not present

## 2021-05-08 DIAGNOSIS — R2681 Unsteadiness on feet: Secondary | ICD-10-CM | POA: Diagnosis not present

## 2021-05-08 DIAGNOSIS — N3946 Mixed incontinence: Secondary | ICD-10-CM | POA: Diagnosis not present

## 2021-05-08 DIAGNOSIS — R1312 Dysphagia, oropharyngeal phase: Secondary | ICD-10-CM | POA: Diagnosis not present

## 2021-05-08 DIAGNOSIS — R41841 Cognitive communication deficit: Secondary | ICD-10-CM | POA: Diagnosis not present

## 2021-05-09 DIAGNOSIS — R1312 Dysphagia, oropharyngeal phase: Secondary | ICD-10-CM | POA: Diagnosis not present

## 2021-05-09 DIAGNOSIS — M6281 Muscle weakness (generalized): Secondary | ICD-10-CM | POA: Diagnosis not present

## 2021-05-09 DIAGNOSIS — R2681 Unsteadiness on feet: Secondary | ICD-10-CM | POA: Diagnosis not present

## 2021-05-09 DIAGNOSIS — R262 Difficulty in walking, not elsewhere classified: Secondary | ICD-10-CM | POA: Diagnosis not present

## 2021-05-09 DIAGNOSIS — N3946 Mixed incontinence: Secondary | ICD-10-CM | POA: Diagnosis not present

## 2021-05-09 DIAGNOSIS — R41841 Cognitive communication deficit: Secondary | ICD-10-CM | POA: Diagnosis not present

## 2021-05-10 DIAGNOSIS — N3946 Mixed incontinence: Secondary | ICD-10-CM | POA: Diagnosis not present

## 2021-05-10 DIAGNOSIS — R41841 Cognitive communication deficit: Secondary | ICD-10-CM | POA: Diagnosis not present

## 2021-05-10 DIAGNOSIS — M6281 Muscle weakness (generalized): Secondary | ICD-10-CM | POA: Diagnosis not present

## 2021-05-10 DIAGNOSIS — R2681 Unsteadiness on feet: Secondary | ICD-10-CM | POA: Diagnosis not present

## 2021-05-10 DIAGNOSIS — R262 Difficulty in walking, not elsewhere classified: Secondary | ICD-10-CM | POA: Diagnosis not present

## 2021-05-10 DIAGNOSIS — R1312 Dysphagia, oropharyngeal phase: Secondary | ICD-10-CM | POA: Diagnosis not present

## 2021-05-13 DIAGNOSIS — R296 Repeated falls: Secondary | ICD-10-CM | POA: Diagnosis not present

## 2021-05-13 DIAGNOSIS — R488 Other symbolic dysfunctions: Secondary | ICD-10-CM | POA: Diagnosis not present

## 2021-05-13 DIAGNOSIS — R41841 Cognitive communication deficit: Secondary | ICD-10-CM | POA: Diagnosis not present

## 2021-05-13 DIAGNOSIS — M6281 Muscle weakness (generalized): Secondary | ICD-10-CM | POA: Diagnosis not present

## 2021-05-13 DIAGNOSIS — R262 Difficulty in walking, not elsewhere classified: Secondary | ICD-10-CM | POA: Diagnosis not present

## 2021-05-13 DIAGNOSIS — N3946 Mixed incontinence: Secondary | ICD-10-CM | POA: Diagnosis not present

## 2021-05-13 DIAGNOSIS — R2681 Unsteadiness on feet: Secondary | ICD-10-CM | POA: Diagnosis not present

## 2021-05-13 DIAGNOSIS — R1312 Dysphagia, oropharyngeal phase: Secondary | ICD-10-CM | POA: Diagnosis not present

## 2021-05-14 DIAGNOSIS — M6281 Muscle weakness (generalized): Secondary | ICD-10-CM | POA: Diagnosis not present

## 2021-05-14 DIAGNOSIS — R262 Difficulty in walking, not elsewhere classified: Secondary | ICD-10-CM | POA: Diagnosis not present

## 2021-05-14 DIAGNOSIS — N3946 Mixed incontinence: Secondary | ICD-10-CM | POA: Diagnosis not present

## 2021-05-14 DIAGNOSIS — R2681 Unsteadiness on feet: Secondary | ICD-10-CM | POA: Diagnosis not present

## 2021-05-14 DIAGNOSIS — R41841 Cognitive communication deficit: Secondary | ICD-10-CM | POA: Diagnosis not present

## 2021-05-14 DIAGNOSIS — R1312 Dysphagia, oropharyngeal phase: Secondary | ICD-10-CM | POA: Diagnosis not present

## 2021-05-15 DIAGNOSIS — R262 Difficulty in walking, not elsewhere classified: Secondary | ICD-10-CM | POA: Diagnosis not present

## 2021-05-15 DIAGNOSIS — R41841 Cognitive communication deficit: Secondary | ICD-10-CM | POA: Diagnosis not present

## 2021-05-15 DIAGNOSIS — R2681 Unsteadiness on feet: Secondary | ICD-10-CM | POA: Diagnosis not present

## 2021-05-15 DIAGNOSIS — M6281 Muscle weakness (generalized): Secondary | ICD-10-CM | POA: Diagnosis not present

## 2021-05-15 DIAGNOSIS — N3946 Mixed incontinence: Secondary | ICD-10-CM | POA: Diagnosis not present

## 2021-05-15 DIAGNOSIS — R1312 Dysphagia, oropharyngeal phase: Secondary | ICD-10-CM | POA: Diagnosis not present

## 2021-05-16 DIAGNOSIS — R41841 Cognitive communication deficit: Secondary | ICD-10-CM | POA: Diagnosis not present

## 2021-05-16 DIAGNOSIS — N3946 Mixed incontinence: Secondary | ICD-10-CM | POA: Diagnosis not present

## 2021-05-16 DIAGNOSIS — M6281 Muscle weakness (generalized): Secondary | ICD-10-CM | POA: Diagnosis not present

## 2021-05-16 DIAGNOSIS — R2681 Unsteadiness on feet: Secondary | ICD-10-CM | POA: Diagnosis not present

## 2021-05-16 DIAGNOSIS — R1312 Dysphagia, oropharyngeal phase: Secondary | ICD-10-CM | POA: Diagnosis not present

## 2021-05-16 DIAGNOSIS — R262 Difficulty in walking, not elsewhere classified: Secondary | ICD-10-CM | POA: Diagnosis not present

## 2021-05-17 DIAGNOSIS — R1312 Dysphagia, oropharyngeal phase: Secondary | ICD-10-CM | POA: Diagnosis not present

## 2021-05-17 DIAGNOSIS — R41841 Cognitive communication deficit: Secondary | ICD-10-CM | POA: Diagnosis not present

## 2021-05-17 DIAGNOSIS — R2681 Unsteadiness on feet: Secondary | ICD-10-CM | POA: Diagnosis not present

## 2021-05-17 DIAGNOSIS — N3946 Mixed incontinence: Secondary | ICD-10-CM | POA: Diagnosis not present

## 2021-05-17 DIAGNOSIS — R262 Difficulty in walking, not elsewhere classified: Secondary | ICD-10-CM | POA: Diagnosis not present

## 2021-05-17 DIAGNOSIS — M6281 Muscle weakness (generalized): Secondary | ICD-10-CM | POA: Diagnosis not present

## 2021-05-19 DIAGNOSIS — R1312 Dysphagia, oropharyngeal phase: Secondary | ICD-10-CM | POA: Diagnosis not present

## 2021-05-19 DIAGNOSIS — M6281 Muscle weakness (generalized): Secondary | ICD-10-CM | POA: Diagnosis not present

## 2021-05-19 DIAGNOSIS — R262 Difficulty in walking, not elsewhere classified: Secondary | ICD-10-CM | POA: Diagnosis not present

## 2021-05-19 DIAGNOSIS — R2681 Unsteadiness on feet: Secondary | ICD-10-CM | POA: Diagnosis not present

## 2021-05-19 DIAGNOSIS — N3946 Mixed incontinence: Secondary | ICD-10-CM | POA: Diagnosis not present

## 2021-05-19 DIAGNOSIS — R41841 Cognitive communication deficit: Secondary | ICD-10-CM | POA: Diagnosis not present

## 2021-05-20 DIAGNOSIS — N3946 Mixed incontinence: Secondary | ICD-10-CM | POA: Diagnosis not present

## 2021-05-20 DIAGNOSIS — R1312 Dysphagia, oropharyngeal phase: Secondary | ICD-10-CM | POA: Diagnosis not present

## 2021-05-20 DIAGNOSIS — R262 Difficulty in walking, not elsewhere classified: Secondary | ICD-10-CM | POA: Diagnosis not present

## 2021-05-20 DIAGNOSIS — R41841 Cognitive communication deficit: Secondary | ICD-10-CM | POA: Diagnosis not present

## 2021-05-20 DIAGNOSIS — R2681 Unsteadiness on feet: Secondary | ICD-10-CM | POA: Diagnosis not present

## 2021-05-20 DIAGNOSIS — M6281 Muscle weakness (generalized): Secondary | ICD-10-CM | POA: Diagnosis not present

## 2021-05-21 DIAGNOSIS — R262 Difficulty in walking, not elsewhere classified: Secondary | ICD-10-CM | POA: Diagnosis not present

## 2021-05-21 DIAGNOSIS — R2681 Unsteadiness on feet: Secondary | ICD-10-CM | POA: Diagnosis not present

## 2021-05-21 DIAGNOSIS — M6281 Muscle weakness (generalized): Secondary | ICD-10-CM | POA: Diagnosis not present

## 2021-05-21 DIAGNOSIS — R41841 Cognitive communication deficit: Secondary | ICD-10-CM | POA: Diagnosis not present

## 2021-05-21 DIAGNOSIS — N3946 Mixed incontinence: Secondary | ICD-10-CM | POA: Diagnosis not present

## 2021-05-21 DIAGNOSIS — R1312 Dysphagia, oropharyngeal phase: Secondary | ICD-10-CM | POA: Diagnosis not present

## 2021-05-22 DIAGNOSIS — N3946 Mixed incontinence: Secondary | ICD-10-CM | POA: Diagnosis not present

## 2021-05-22 DIAGNOSIS — R262 Difficulty in walking, not elsewhere classified: Secondary | ICD-10-CM | POA: Diagnosis not present

## 2021-05-22 DIAGNOSIS — R2681 Unsteadiness on feet: Secondary | ICD-10-CM | POA: Diagnosis not present

## 2021-05-22 DIAGNOSIS — R41841 Cognitive communication deficit: Secondary | ICD-10-CM | POA: Diagnosis not present

## 2021-05-22 DIAGNOSIS — R1312 Dysphagia, oropharyngeal phase: Secondary | ICD-10-CM | POA: Diagnosis not present

## 2021-05-22 DIAGNOSIS — M6281 Muscle weakness (generalized): Secondary | ICD-10-CM | POA: Diagnosis not present

## 2021-05-23 DIAGNOSIS — R1312 Dysphagia, oropharyngeal phase: Secondary | ICD-10-CM | POA: Diagnosis not present

## 2021-05-23 DIAGNOSIS — R262 Difficulty in walking, not elsewhere classified: Secondary | ICD-10-CM | POA: Diagnosis not present

## 2021-05-23 DIAGNOSIS — N3946 Mixed incontinence: Secondary | ICD-10-CM | POA: Diagnosis not present

## 2021-05-23 DIAGNOSIS — M6281 Muscle weakness (generalized): Secondary | ICD-10-CM | POA: Diagnosis not present

## 2021-05-23 DIAGNOSIS — R2681 Unsteadiness on feet: Secondary | ICD-10-CM | POA: Diagnosis not present

## 2021-05-23 DIAGNOSIS — R41841 Cognitive communication deficit: Secondary | ICD-10-CM | POA: Diagnosis not present

## 2021-05-24 DIAGNOSIS — R2681 Unsteadiness on feet: Secondary | ICD-10-CM | POA: Diagnosis not present

## 2021-05-24 DIAGNOSIS — M6281 Muscle weakness (generalized): Secondary | ICD-10-CM | POA: Diagnosis not present

## 2021-05-24 DIAGNOSIS — R262 Difficulty in walking, not elsewhere classified: Secondary | ICD-10-CM | POA: Diagnosis not present

## 2021-05-24 DIAGNOSIS — N3946 Mixed incontinence: Secondary | ICD-10-CM | POA: Diagnosis not present

## 2021-05-24 DIAGNOSIS — R1312 Dysphagia, oropharyngeal phase: Secondary | ICD-10-CM | POA: Diagnosis not present

## 2021-05-24 DIAGNOSIS — R41841 Cognitive communication deficit: Secondary | ICD-10-CM | POA: Diagnosis not present

## 2021-05-27 DIAGNOSIS — R2681 Unsteadiness on feet: Secondary | ICD-10-CM | POA: Diagnosis not present

## 2021-05-27 DIAGNOSIS — R262 Difficulty in walking, not elsewhere classified: Secondary | ICD-10-CM | POA: Diagnosis not present

## 2021-05-27 DIAGNOSIS — R1312 Dysphagia, oropharyngeal phase: Secondary | ICD-10-CM | POA: Diagnosis not present

## 2021-05-27 DIAGNOSIS — D539 Nutritional anemia, unspecified: Secondary | ICD-10-CM | POA: Diagnosis not present

## 2021-05-27 DIAGNOSIS — K5901 Slow transit constipation: Secondary | ICD-10-CM | POA: Diagnosis not present

## 2021-05-27 DIAGNOSIS — N3946 Mixed incontinence: Secondary | ICD-10-CM | POA: Diagnosis not present

## 2021-05-27 DIAGNOSIS — M6281 Muscle weakness (generalized): Secondary | ICD-10-CM | POA: Diagnosis not present

## 2021-05-27 DIAGNOSIS — N400 Enlarged prostate without lower urinary tract symptoms: Secondary | ICD-10-CM | POA: Diagnosis not present

## 2021-05-27 DIAGNOSIS — E1159 Type 2 diabetes mellitus with other circulatory complications: Secondary | ICD-10-CM | POA: Diagnosis not present

## 2021-05-27 DIAGNOSIS — Z9181 History of falling: Secondary | ICD-10-CM | POA: Diagnosis not present

## 2021-05-27 DIAGNOSIS — R41841 Cognitive communication deficit: Secondary | ICD-10-CM | POA: Diagnosis not present

## 2021-05-27 DIAGNOSIS — G309 Alzheimer's disease, unspecified: Secondary | ICD-10-CM | POA: Diagnosis not present

## 2021-05-28 DIAGNOSIS — R2681 Unsteadiness on feet: Secondary | ICD-10-CM | POA: Diagnosis not present

## 2021-05-28 DIAGNOSIS — Z79899 Other long term (current) drug therapy: Secondary | ICD-10-CM | POA: Diagnosis not present

## 2021-05-28 DIAGNOSIS — R262 Difficulty in walking, not elsewhere classified: Secondary | ICD-10-CM | POA: Diagnosis not present

## 2021-05-28 DIAGNOSIS — N3946 Mixed incontinence: Secondary | ICD-10-CM | POA: Diagnosis not present

## 2021-05-28 DIAGNOSIS — S80211A Abrasion, right knee, initial encounter: Secondary | ICD-10-CM | POA: Diagnosis not present

## 2021-05-28 DIAGNOSIS — W19XXXA Unspecified fall, initial encounter: Secondary | ICD-10-CM | POA: Diagnosis not present

## 2021-05-28 DIAGNOSIS — F0281 Dementia in other diseases classified elsewhere with behavioral disturbance: Secondary | ICD-10-CM | POA: Diagnosis not present

## 2021-05-28 DIAGNOSIS — M6281 Muscle weakness (generalized): Secondary | ICD-10-CM | POA: Diagnosis not present

## 2021-05-28 DIAGNOSIS — R41841 Cognitive communication deficit: Secondary | ICD-10-CM | POA: Diagnosis not present

## 2021-05-28 DIAGNOSIS — E782 Mixed hyperlipidemia: Secondary | ICD-10-CM | POA: Diagnosis not present

## 2021-05-28 DIAGNOSIS — R1312 Dysphagia, oropharyngeal phase: Secondary | ICD-10-CM | POA: Diagnosis not present

## 2021-05-28 DIAGNOSIS — G309 Alzheimer's disease, unspecified: Secondary | ICD-10-CM | POA: Diagnosis not present

## 2021-05-28 DIAGNOSIS — D509 Iron deficiency anemia, unspecified: Secondary | ICD-10-CM | POA: Diagnosis not present

## 2021-05-28 IMAGING — DX DG CHEST 1V PORT
1 series · 1 of 1 positions shown · non-contrast
Comparison: None.

CLINICAL DATA: Lethargy.  Weakness.

EXAM:
PORTABLE CHEST 1 VIEW

[chest ap]
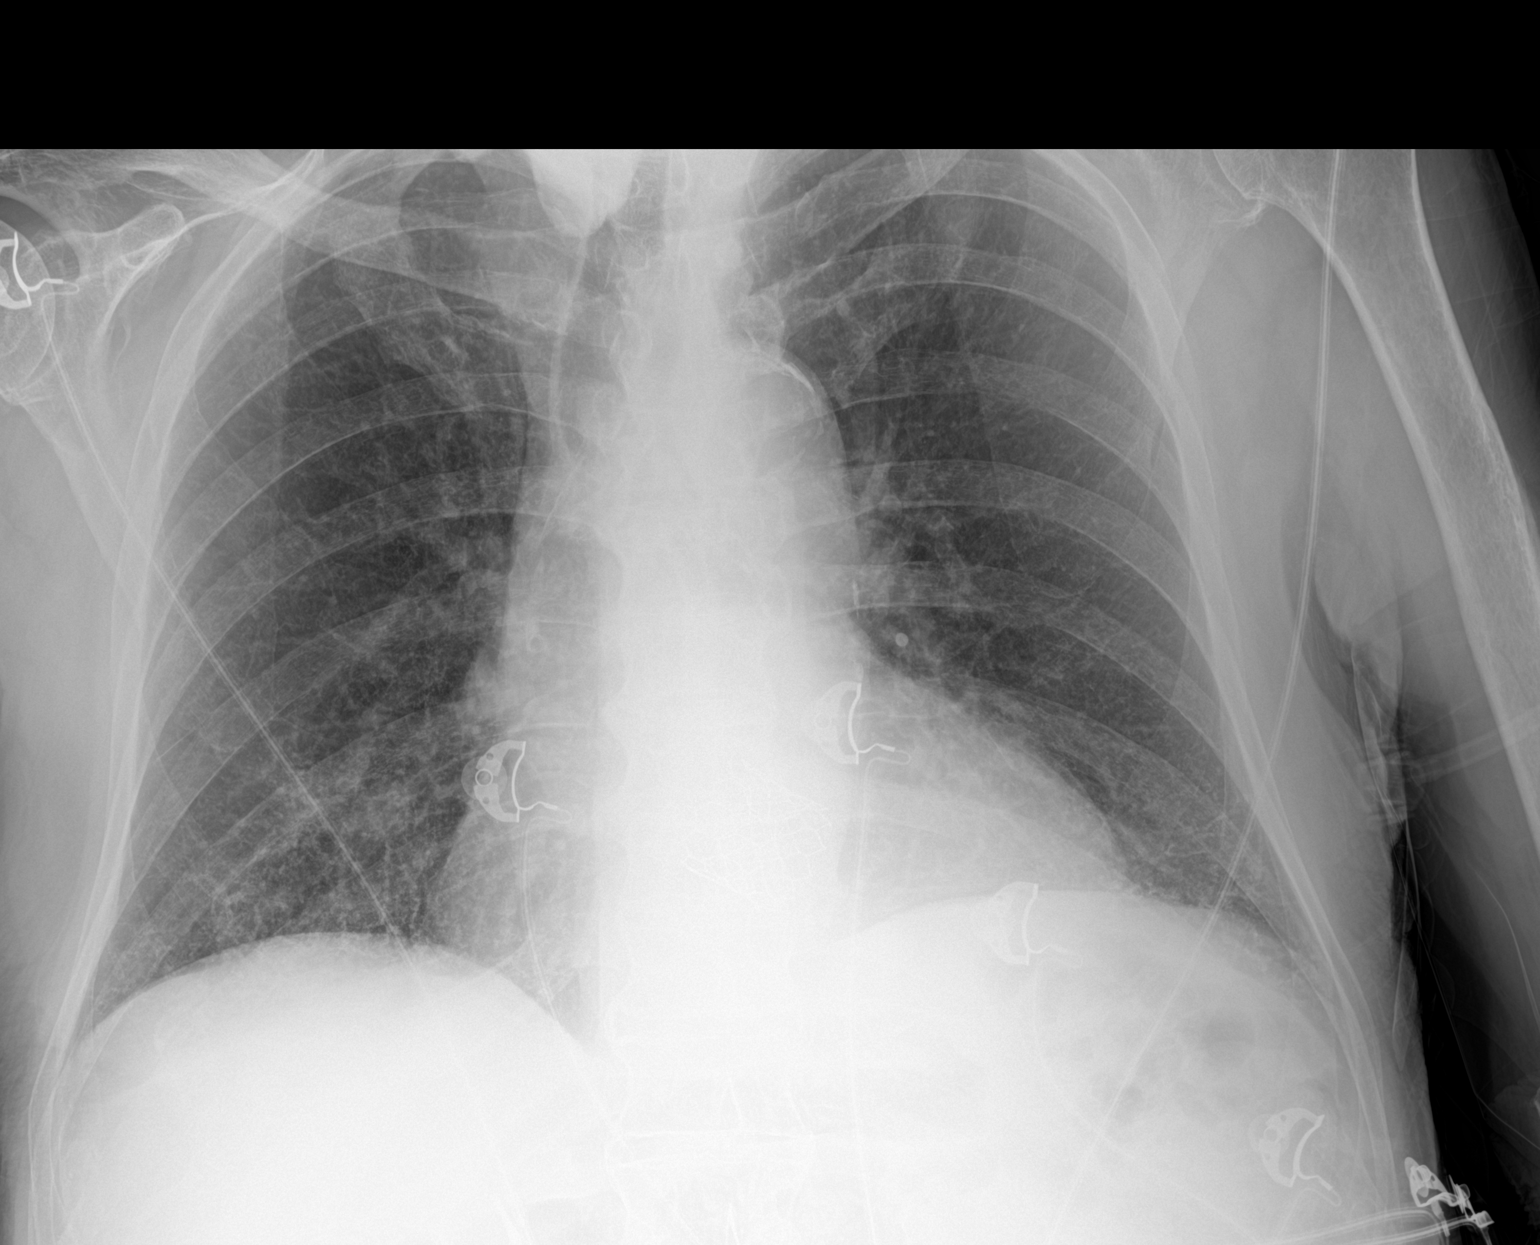

[1 of 1 positions shown; findings below may reference images not displayed]

FINDINGS: Heart size is accentuated by technique and mildly enlarged. Remote
aortic valve repair. The lungs are free of focal consolidations and
pleural effusions. No pulmonary edema. There is minimal subsegmental
atelectasis at both lung bases.
IMPRESSION: 1. Cardiomegaly.
2. Bibasilar atelectasis.

## 2021-05-29 DIAGNOSIS — M6281 Muscle weakness (generalized): Secondary | ICD-10-CM | POA: Diagnosis not present

## 2021-05-29 DIAGNOSIS — N3946 Mixed incontinence: Secondary | ICD-10-CM | POA: Diagnosis not present

## 2021-05-29 DIAGNOSIS — R1312 Dysphagia, oropharyngeal phase: Secondary | ICD-10-CM | POA: Diagnosis not present

## 2021-05-29 DIAGNOSIS — R2681 Unsteadiness on feet: Secondary | ICD-10-CM | POA: Diagnosis not present

## 2021-05-29 DIAGNOSIS — R41841 Cognitive communication deficit: Secondary | ICD-10-CM | POA: Diagnosis not present

## 2021-05-29 DIAGNOSIS — R262 Difficulty in walking, not elsewhere classified: Secondary | ICD-10-CM | POA: Diagnosis not present

## 2021-05-30 DIAGNOSIS — N3946 Mixed incontinence: Secondary | ICD-10-CM | POA: Diagnosis not present

## 2021-05-30 DIAGNOSIS — R1312 Dysphagia, oropharyngeal phase: Secondary | ICD-10-CM | POA: Diagnosis not present

## 2021-05-30 DIAGNOSIS — R41841 Cognitive communication deficit: Secondary | ICD-10-CM | POA: Diagnosis not present

## 2021-05-30 DIAGNOSIS — M6281 Muscle weakness (generalized): Secondary | ICD-10-CM | POA: Diagnosis not present

## 2021-05-30 DIAGNOSIS — R262 Difficulty in walking, not elsewhere classified: Secondary | ICD-10-CM | POA: Diagnosis not present

## 2021-05-30 DIAGNOSIS — R2681 Unsteadiness on feet: Secondary | ICD-10-CM | POA: Diagnosis not present

## 2021-05-31 DIAGNOSIS — R262 Difficulty in walking, not elsewhere classified: Secondary | ICD-10-CM | POA: Diagnosis not present

## 2021-05-31 DIAGNOSIS — R41841 Cognitive communication deficit: Secondary | ICD-10-CM | POA: Diagnosis not present

## 2021-05-31 DIAGNOSIS — R1312 Dysphagia, oropharyngeal phase: Secondary | ICD-10-CM | POA: Diagnosis not present

## 2021-05-31 DIAGNOSIS — R2681 Unsteadiness on feet: Secondary | ICD-10-CM | POA: Diagnosis not present

## 2021-05-31 DIAGNOSIS — N3946 Mixed incontinence: Secondary | ICD-10-CM | POA: Diagnosis not present

## 2021-05-31 DIAGNOSIS — M6281 Muscle weakness (generalized): Secondary | ICD-10-CM | POA: Diagnosis not present

## 2021-06-05 DIAGNOSIS — F988 Other specified behavioral and emotional disorders with onset usually occurring in childhood and adolescence: Secondary | ICD-10-CM | POA: Diagnosis not present

## 2021-06-05 DIAGNOSIS — R55 Syncope and collapse: Secondary | ICD-10-CM | POA: Diagnosis not present

## 2021-06-05 DIAGNOSIS — H9190 Unspecified hearing loss, unspecified ear: Secondary | ICD-10-CM | POA: Diagnosis not present

## 2021-06-05 DIAGNOSIS — R296 Repeated falls: Secondary | ICD-10-CM | POA: Diagnosis not present

## 2021-06-05 DIAGNOSIS — K219 Gastro-esophageal reflux disease without esophagitis: Secondary | ICD-10-CM | POA: Diagnosis not present

## 2021-06-05 DIAGNOSIS — I447 Left bundle-branch block, unspecified: Secondary | ICD-10-CM | POA: Diagnosis not present

## 2021-06-05 DIAGNOSIS — F32A Depression, unspecified: Secondary | ICD-10-CM | POA: Diagnosis not present

## 2021-06-05 DIAGNOSIS — E785 Hyperlipidemia, unspecified: Secondary | ICD-10-CM | POA: Diagnosis not present

## 2021-06-05 DIAGNOSIS — I5030 Unspecified diastolic (congestive) heart failure: Secondary | ICD-10-CM | POA: Diagnosis not present

## 2021-06-05 DIAGNOSIS — F0280 Dementia in other diseases classified elsewhere without behavioral disturbance: Secondary | ICD-10-CM | POA: Diagnosis not present

## 2021-06-05 DIAGNOSIS — N183 Chronic kidney disease, stage 3 unspecified: Secondary | ICD-10-CM | POA: Diagnosis not present

## 2021-06-05 DIAGNOSIS — E1122 Type 2 diabetes mellitus with diabetic chronic kidney disease: Secondary | ICD-10-CM | POA: Diagnosis not present

## 2021-06-05 DIAGNOSIS — N400 Enlarged prostate without lower urinary tract symptoms: Secondary | ICD-10-CM | POA: Diagnosis not present

## 2021-06-05 DIAGNOSIS — Z87442 Personal history of urinary calculi: Secondary | ICD-10-CM | POA: Diagnosis not present

## 2021-06-05 DIAGNOSIS — I4891 Unspecified atrial fibrillation: Secondary | ICD-10-CM | POA: Diagnosis not present

## 2021-06-05 DIAGNOSIS — D631 Anemia in chronic kidney disease: Secondary | ICD-10-CM | POA: Diagnosis not present

## 2021-06-05 DIAGNOSIS — I350 Nonrheumatic aortic (valve) stenosis: Secondary | ICD-10-CM | POA: Diagnosis not present

## 2021-06-05 DIAGNOSIS — I130 Hypertensive heart and chronic kidney disease with heart failure and stage 1 through stage 4 chronic kidney disease, or unspecified chronic kidney disease: Secondary | ICD-10-CM | POA: Diagnosis not present

## 2021-06-05 DIAGNOSIS — G309 Alzheimer's disease, unspecified: Secondary | ICD-10-CM | POA: Diagnosis not present

## 2021-06-05 DIAGNOSIS — M1990 Unspecified osteoarthritis, unspecified site: Secondary | ICD-10-CM | POA: Diagnosis not present

## 2021-06-05 DIAGNOSIS — F419 Anxiety disorder, unspecified: Secondary | ICD-10-CM | POA: Diagnosis not present

## 2021-06-06 DIAGNOSIS — I5030 Unspecified diastolic (congestive) heart failure: Secondary | ICD-10-CM | POA: Diagnosis not present

## 2021-06-06 DIAGNOSIS — R262 Difficulty in walking, not elsewhere classified: Secondary | ICD-10-CM | POA: Diagnosis not present

## 2021-06-06 DIAGNOSIS — D631 Anemia in chronic kidney disease: Secondary | ICD-10-CM | POA: Diagnosis not present

## 2021-06-06 DIAGNOSIS — I130 Hypertensive heart and chronic kidney disease with heart failure and stage 1 through stage 4 chronic kidney disease, or unspecified chronic kidney disease: Secondary | ICD-10-CM | POA: Diagnosis not present

## 2021-06-06 DIAGNOSIS — M6281 Muscle weakness (generalized): Secondary | ICD-10-CM | POA: Diagnosis not present

## 2021-06-06 DIAGNOSIS — E785 Hyperlipidemia, unspecified: Secondary | ICD-10-CM | POA: Diagnosis not present

## 2021-06-06 DIAGNOSIS — E1122 Type 2 diabetes mellitus with diabetic chronic kidney disease: Secondary | ICD-10-CM | POA: Diagnosis not present

## 2021-06-06 DIAGNOSIS — N183 Chronic kidney disease, stage 3 unspecified: Secondary | ICD-10-CM | POA: Diagnosis not present

## 2021-06-06 DIAGNOSIS — N3946 Mixed incontinence: Secondary | ICD-10-CM | POA: Diagnosis not present

## 2021-06-06 DIAGNOSIS — R2681 Unsteadiness on feet: Secondary | ICD-10-CM | POA: Diagnosis not present

## 2021-06-06 DIAGNOSIS — R1312 Dysphagia, oropharyngeal phase: Secondary | ICD-10-CM | POA: Diagnosis not present

## 2021-06-06 DIAGNOSIS — R41841 Cognitive communication deficit: Secondary | ICD-10-CM | POA: Diagnosis not present

## 2021-06-07 DIAGNOSIS — M6281 Muscle weakness (generalized): Secondary | ICD-10-CM | POA: Diagnosis not present

## 2021-06-07 DIAGNOSIS — E785 Hyperlipidemia, unspecified: Secondary | ICD-10-CM | POA: Diagnosis not present

## 2021-06-07 DIAGNOSIS — R1312 Dysphagia, oropharyngeal phase: Secondary | ICD-10-CM | POA: Diagnosis not present

## 2021-06-07 DIAGNOSIS — I5030 Unspecified diastolic (congestive) heart failure: Secondary | ICD-10-CM | POA: Diagnosis not present

## 2021-06-07 DIAGNOSIS — I130 Hypertensive heart and chronic kidney disease with heart failure and stage 1 through stage 4 chronic kidney disease, or unspecified chronic kidney disease: Secondary | ICD-10-CM | POA: Diagnosis not present

## 2021-06-07 DIAGNOSIS — R262 Difficulty in walking, not elsewhere classified: Secondary | ICD-10-CM | POA: Diagnosis not present

## 2021-06-07 DIAGNOSIS — N3946 Mixed incontinence: Secondary | ICD-10-CM | POA: Diagnosis not present

## 2021-06-07 DIAGNOSIS — D631 Anemia in chronic kidney disease: Secondary | ICD-10-CM | POA: Diagnosis not present

## 2021-06-07 DIAGNOSIS — N183 Chronic kidney disease, stage 3 unspecified: Secondary | ICD-10-CM | POA: Diagnosis not present

## 2021-06-07 DIAGNOSIS — E1122 Type 2 diabetes mellitus with diabetic chronic kidney disease: Secondary | ICD-10-CM | POA: Diagnosis not present

## 2021-06-07 DIAGNOSIS — R41841 Cognitive communication deficit: Secondary | ICD-10-CM | POA: Diagnosis not present

## 2021-06-07 DIAGNOSIS — R2681 Unsteadiness on feet: Secondary | ICD-10-CM | POA: Diagnosis not present

## 2021-06-10 DIAGNOSIS — R262 Difficulty in walking, not elsewhere classified: Secondary | ICD-10-CM | POA: Diagnosis not present

## 2021-06-10 DIAGNOSIS — N3946 Mixed incontinence: Secondary | ICD-10-CM | POA: Diagnosis not present

## 2021-06-10 DIAGNOSIS — R41841 Cognitive communication deficit: Secondary | ICD-10-CM | POA: Diagnosis not present

## 2021-06-10 DIAGNOSIS — E1159 Type 2 diabetes mellitus with other circulatory complications: Secondary | ICD-10-CM | POA: Diagnosis not present

## 2021-06-10 DIAGNOSIS — M6281 Muscle weakness (generalized): Secondary | ICD-10-CM | POA: Diagnosis not present

## 2021-06-10 DIAGNOSIS — R2681 Unsteadiness on feet: Secondary | ICD-10-CM | POA: Diagnosis not present

## 2021-06-10 DIAGNOSIS — I739 Peripheral vascular disease, unspecified: Secondary | ICD-10-CM | POA: Diagnosis not present

## 2021-06-10 DIAGNOSIS — R1312 Dysphagia, oropharyngeal phase: Secondary | ICD-10-CM | POA: Diagnosis not present

## 2021-06-10 DIAGNOSIS — L603 Nail dystrophy: Secondary | ICD-10-CM | POA: Diagnosis not present

## 2021-06-11 DIAGNOSIS — E785 Hyperlipidemia, unspecified: Secondary | ICD-10-CM | POA: Diagnosis not present

## 2021-06-11 DIAGNOSIS — D631 Anemia in chronic kidney disease: Secondary | ICD-10-CM | POA: Diagnosis not present

## 2021-06-11 DIAGNOSIS — I130 Hypertensive heart and chronic kidney disease with heart failure and stage 1 through stage 4 chronic kidney disease, or unspecified chronic kidney disease: Secondary | ICD-10-CM | POA: Diagnosis not present

## 2021-06-11 DIAGNOSIS — N183 Chronic kidney disease, stage 3 unspecified: Secondary | ICD-10-CM | POA: Diagnosis not present

## 2021-06-11 DIAGNOSIS — I5030 Unspecified diastolic (congestive) heart failure: Secondary | ICD-10-CM | POA: Diagnosis not present

## 2021-06-11 DIAGNOSIS — E1122 Type 2 diabetes mellitus with diabetic chronic kidney disease: Secondary | ICD-10-CM | POA: Diagnosis not present

## 2021-06-12 DIAGNOSIS — R2681 Unsteadiness on feet: Secondary | ICD-10-CM | POA: Diagnosis not present

## 2021-06-12 DIAGNOSIS — R262 Difficulty in walking, not elsewhere classified: Secondary | ICD-10-CM | POA: Diagnosis not present

## 2021-06-12 DIAGNOSIS — R1312 Dysphagia, oropharyngeal phase: Secondary | ICD-10-CM | POA: Diagnosis not present

## 2021-06-12 DIAGNOSIS — M6281 Muscle weakness (generalized): Secondary | ICD-10-CM | POA: Diagnosis not present

## 2021-06-12 DIAGNOSIS — N3946 Mixed incontinence: Secondary | ICD-10-CM | POA: Diagnosis not present

## 2021-06-12 DIAGNOSIS — R41841 Cognitive communication deficit: Secondary | ICD-10-CM | POA: Diagnosis not present

## 2021-06-13 DIAGNOSIS — F988 Other specified behavioral and emotional disorders with onset usually occurring in childhood and adolescence: Secondary | ICD-10-CM | POA: Diagnosis not present

## 2021-06-13 DIAGNOSIS — H9190 Unspecified hearing loss, unspecified ear: Secondary | ICD-10-CM | POA: Diagnosis not present

## 2021-06-13 DIAGNOSIS — K219 Gastro-esophageal reflux disease without esophagitis: Secondary | ICD-10-CM | POA: Diagnosis not present

## 2021-06-13 DIAGNOSIS — I4891 Unspecified atrial fibrillation: Secondary | ICD-10-CM | POA: Diagnosis not present

## 2021-06-13 DIAGNOSIS — G309 Alzheimer's disease, unspecified: Secondary | ICD-10-CM | POA: Diagnosis not present

## 2021-06-13 DIAGNOSIS — I350 Nonrheumatic aortic (valve) stenosis: Secondary | ICD-10-CM | POA: Diagnosis not present

## 2021-06-13 DIAGNOSIS — M6281 Muscle weakness (generalized): Secondary | ICD-10-CM | POA: Diagnosis not present

## 2021-06-13 DIAGNOSIS — R1312 Dysphagia, oropharyngeal phase: Secondary | ICD-10-CM | POA: Diagnosis not present

## 2021-06-13 DIAGNOSIS — R41841 Cognitive communication deficit: Secondary | ICD-10-CM | POA: Diagnosis not present

## 2021-06-13 DIAGNOSIS — F32A Depression, unspecified: Secondary | ICD-10-CM | POA: Diagnosis not present

## 2021-06-13 DIAGNOSIS — R2681 Unsteadiness on feet: Secondary | ICD-10-CM | POA: Diagnosis not present

## 2021-06-13 DIAGNOSIS — Z87442 Personal history of urinary calculi: Secondary | ICD-10-CM | POA: Diagnosis not present

## 2021-06-13 DIAGNOSIS — N400 Enlarged prostate without lower urinary tract symptoms: Secondary | ICD-10-CM | POA: Diagnosis not present

## 2021-06-13 DIAGNOSIS — R262 Difficulty in walking, not elsewhere classified: Secondary | ICD-10-CM | POA: Diagnosis not present

## 2021-06-13 DIAGNOSIS — I130 Hypertensive heart and chronic kidney disease with heart failure and stage 1 through stage 4 chronic kidney disease, or unspecified chronic kidney disease: Secondary | ICD-10-CM | POA: Diagnosis not present

## 2021-06-13 DIAGNOSIS — R296 Repeated falls: Secondary | ICD-10-CM | POA: Diagnosis not present

## 2021-06-13 DIAGNOSIS — F419 Anxiety disorder, unspecified: Secondary | ICD-10-CM | POA: Diagnosis not present

## 2021-06-13 DIAGNOSIS — D631 Anemia in chronic kidney disease: Secondary | ICD-10-CM | POA: Diagnosis not present

## 2021-06-13 DIAGNOSIS — R488 Other symbolic dysfunctions: Secondary | ICD-10-CM | POA: Diagnosis not present

## 2021-06-13 DIAGNOSIS — M1990 Unspecified osteoarthritis, unspecified site: Secondary | ICD-10-CM | POA: Diagnosis not present

## 2021-06-13 DIAGNOSIS — I5030 Unspecified diastolic (congestive) heart failure: Secondary | ICD-10-CM | POA: Diagnosis not present

## 2021-06-13 DIAGNOSIS — E1122 Type 2 diabetes mellitus with diabetic chronic kidney disease: Secondary | ICD-10-CM | POA: Diagnosis not present

## 2021-06-13 DIAGNOSIS — F0280 Dementia in other diseases classified elsewhere without behavioral disturbance: Secondary | ICD-10-CM | POA: Diagnosis not present

## 2021-06-13 DIAGNOSIS — I447 Left bundle-branch block, unspecified: Secondary | ICD-10-CM | POA: Diagnosis not present

## 2021-06-13 DIAGNOSIS — E785 Hyperlipidemia, unspecified: Secondary | ICD-10-CM | POA: Diagnosis not present

## 2021-06-13 DIAGNOSIS — R55 Syncope and collapse: Secondary | ICD-10-CM | POA: Diagnosis not present

## 2021-06-13 DIAGNOSIS — N3946 Mixed incontinence: Secondary | ICD-10-CM | POA: Diagnosis not present

## 2021-06-13 DIAGNOSIS — N183 Chronic kidney disease, stage 3 unspecified: Secondary | ICD-10-CM | POA: Diagnosis not present

## 2021-06-14 DIAGNOSIS — E1122 Type 2 diabetes mellitus with diabetic chronic kidney disease: Secondary | ICD-10-CM | POA: Diagnosis not present

## 2021-06-14 DIAGNOSIS — E785 Hyperlipidemia, unspecified: Secondary | ICD-10-CM | POA: Diagnosis not present

## 2021-06-14 DIAGNOSIS — I130 Hypertensive heart and chronic kidney disease with heart failure and stage 1 through stage 4 chronic kidney disease, or unspecified chronic kidney disease: Secondary | ICD-10-CM | POA: Diagnosis not present

## 2021-06-14 DIAGNOSIS — I5030 Unspecified diastolic (congestive) heart failure: Secondary | ICD-10-CM | POA: Diagnosis not present

## 2021-06-14 DIAGNOSIS — D631 Anemia in chronic kidney disease: Secondary | ICD-10-CM | POA: Diagnosis not present

## 2021-06-14 DIAGNOSIS — N183 Chronic kidney disease, stage 3 unspecified: Secondary | ICD-10-CM | POA: Diagnosis not present

## 2021-06-17 DIAGNOSIS — N3946 Mixed incontinence: Secondary | ICD-10-CM | POA: Diagnosis not present

## 2021-06-17 DIAGNOSIS — R262 Difficulty in walking, not elsewhere classified: Secondary | ICD-10-CM | POA: Diagnosis not present

## 2021-06-17 DIAGNOSIS — R2681 Unsteadiness on feet: Secondary | ICD-10-CM | POA: Diagnosis not present

## 2021-06-17 DIAGNOSIS — M6281 Muscle weakness (generalized): Secondary | ICD-10-CM | POA: Diagnosis not present

## 2021-06-17 DIAGNOSIS — R1312 Dysphagia, oropharyngeal phase: Secondary | ICD-10-CM | POA: Diagnosis not present

## 2021-06-17 DIAGNOSIS — R41841 Cognitive communication deficit: Secondary | ICD-10-CM | POA: Diagnosis not present

## 2021-06-19 DIAGNOSIS — N183 Chronic kidney disease, stage 3 unspecified: Secondary | ICD-10-CM | POA: Diagnosis not present

## 2021-06-19 DIAGNOSIS — R262 Difficulty in walking, not elsewhere classified: Secondary | ICD-10-CM | POA: Diagnosis not present

## 2021-06-19 DIAGNOSIS — R41841 Cognitive communication deficit: Secondary | ICD-10-CM | POA: Diagnosis not present

## 2021-06-19 DIAGNOSIS — N3946 Mixed incontinence: Secondary | ICD-10-CM | POA: Diagnosis not present

## 2021-06-19 DIAGNOSIS — R2681 Unsteadiness on feet: Secondary | ICD-10-CM | POA: Diagnosis not present

## 2021-06-19 DIAGNOSIS — R1312 Dysphagia, oropharyngeal phase: Secondary | ICD-10-CM | POA: Diagnosis not present

## 2021-06-19 DIAGNOSIS — E1122 Type 2 diabetes mellitus with diabetic chronic kidney disease: Secondary | ICD-10-CM | POA: Diagnosis not present

## 2021-06-19 DIAGNOSIS — D631 Anemia in chronic kidney disease: Secondary | ICD-10-CM | POA: Diagnosis not present

## 2021-06-19 DIAGNOSIS — I130 Hypertensive heart and chronic kidney disease with heart failure and stage 1 through stage 4 chronic kidney disease, or unspecified chronic kidney disease: Secondary | ICD-10-CM | POA: Diagnosis not present

## 2021-06-19 DIAGNOSIS — M6281 Muscle weakness (generalized): Secondary | ICD-10-CM | POA: Diagnosis not present

## 2021-06-19 DIAGNOSIS — E785 Hyperlipidemia, unspecified: Secondary | ICD-10-CM | POA: Diagnosis not present

## 2021-06-19 DIAGNOSIS — I5030 Unspecified diastolic (congestive) heart failure: Secondary | ICD-10-CM | POA: Diagnosis not present

## 2021-06-20 DIAGNOSIS — D631 Anemia in chronic kidney disease: Secondary | ICD-10-CM | POA: Diagnosis not present

## 2021-06-20 DIAGNOSIS — R1312 Dysphagia, oropharyngeal phase: Secondary | ICD-10-CM | POA: Diagnosis not present

## 2021-06-20 DIAGNOSIS — N183 Chronic kidney disease, stage 3 unspecified: Secondary | ICD-10-CM | POA: Diagnosis not present

## 2021-06-20 DIAGNOSIS — R262 Difficulty in walking, not elsewhere classified: Secondary | ICD-10-CM | POA: Diagnosis not present

## 2021-06-20 DIAGNOSIS — R2681 Unsteadiness on feet: Secondary | ICD-10-CM | POA: Diagnosis not present

## 2021-06-20 DIAGNOSIS — N3946 Mixed incontinence: Secondary | ICD-10-CM | POA: Diagnosis not present

## 2021-06-20 DIAGNOSIS — E785 Hyperlipidemia, unspecified: Secondary | ICD-10-CM | POA: Diagnosis not present

## 2021-06-20 DIAGNOSIS — M6281 Muscle weakness (generalized): Secondary | ICD-10-CM | POA: Diagnosis not present

## 2021-06-20 DIAGNOSIS — E1122 Type 2 diabetes mellitus with diabetic chronic kidney disease: Secondary | ICD-10-CM | POA: Diagnosis not present

## 2021-06-20 DIAGNOSIS — R41841 Cognitive communication deficit: Secondary | ICD-10-CM | POA: Diagnosis not present

## 2021-06-20 DIAGNOSIS — I5030 Unspecified diastolic (congestive) heart failure: Secondary | ICD-10-CM | POA: Diagnosis not present

## 2021-06-20 DIAGNOSIS — I130 Hypertensive heart and chronic kidney disease with heart failure and stage 1 through stage 4 chronic kidney disease, or unspecified chronic kidney disease: Secondary | ICD-10-CM | POA: Diagnosis not present

## 2021-06-21 DIAGNOSIS — R262 Difficulty in walking, not elsewhere classified: Secondary | ICD-10-CM | POA: Diagnosis not present

## 2021-06-21 DIAGNOSIS — R1312 Dysphagia, oropharyngeal phase: Secondary | ICD-10-CM | POA: Diagnosis not present

## 2021-06-21 DIAGNOSIS — N3946 Mixed incontinence: Secondary | ICD-10-CM | POA: Diagnosis not present

## 2021-06-21 DIAGNOSIS — R2681 Unsteadiness on feet: Secondary | ICD-10-CM | POA: Diagnosis not present

## 2021-06-21 DIAGNOSIS — R41841 Cognitive communication deficit: Secondary | ICD-10-CM | POA: Diagnosis not present

## 2021-06-21 DIAGNOSIS — M6281 Muscle weakness (generalized): Secondary | ICD-10-CM | POA: Diagnosis not present

## 2021-06-24 DIAGNOSIS — R262 Difficulty in walking, not elsewhere classified: Secondary | ICD-10-CM | POA: Diagnosis not present

## 2021-06-24 DIAGNOSIS — R2681 Unsteadiness on feet: Secondary | ICD-10-CM | POA: Diagnosis not present

## 2021-06-24 DIAGNOSIS — N183 Chronic kidney disease, stage 3 unspecified: Secondary | ICD-10-CM | POA: Diagnosis not present

## 2021-06-24 DIAGNOSIS — E1122 Type 2 diabetes mellitus with diabetic chronic kidney disease: Secondary | ICD-10-CM | POA: Diagnosis not present

## 2021-06-24 DIAGNOSIS — R41841 Cognitive communication deficit: Secondary | ICD-10-CM | POA: Diagnosis not present

## 2021-06-24 DIAGNOSIS — M6281 Muscle weakness (generalized): Secondary | ICD-10-CM | POA: Diagnosis not present

## 2021-06-24 DIAGNOSIS — R1312 Dysphagia, oropharyngeal phase: Secondary | ICD-10-CM | POA: Diagnosis not present

## 2021-06-24 DIAGNOSIS — I130 Hypertensive heart and chronic kidney disease with heart failure and stage 1 through stage 4 chronic kidney disease, or unspecified chronic kidney disease: Secondary | ICD-10-CM | POA: Diagnosis not present

## 2021-06-24 DIAGNOSIS — E785 Hyperlipidemia, unspecified: Secondary | ICD-10-CM | POA: Diagnosis not present

## 2021-06-24 DIAGNOSIS — I5030 Unspecified diastolic (congestive) heart failure: Secondary | ICD-10-CM | POA: Diagnosis not present

## 2021-06-24 DIAGNOSIS — N3946 Mixed incontinence: Secondary | ICD-10-CM | POA: Diagnosis not present

## 2021-06-24 DIAGNOSIS — D631 Anemia in chronic kidney disease: Secondary | ICD-10-CM | POA: Diagnosis not present

## 2021-06-25 DIAGNOSIS — D631 Anemia in chronic kidney disease: Secondary | ICD-10-CM | POA: Diagnosis not present

## 2021-06-25 DIAGNOSIS — I5030 Unspecified diastolic (congestive) heart failure: Secondary | ICD-10-CM | POA: Diagnosis not present

## 2021-06-25 DIAGNOSIS — Z79899 Other long term (current) drug therapy: Secondary | ICD-10-CM | POA: Diagnosis not present

## 2021-06-25 DIAGNOSIS — E1159 Type 2 diabetes mellitus with other circulatory complications: Secondary | ICD-10-CM | POA: Diagnosis not present

## 2021-06-25 DIAGNOSIS — E1122 Type 2 diabetes mellitus with diabetic chronic kidney disease: Secondary | ICD-10-CM | POA: Diagnosis not present

## 2021-06-25 DIAGNOSIS — M6281 Muscle weakness (generalized): Secondary | ICD-10-CM | POA: Diagnosis not present

## 2021-06-25 DIAGNOSIS — E785 Hyperlipidemia, unspecified: Secondary | ICD-10-CM | POA: Diagnosis not present

## 2021-06-25 DIAGNOSIS — I130 Hypertensive heart and chronic kidney disease with heart failure and stage 1 through stage 4 chronic kidney disease, or unspecified chronic kidney disease: Secondary | ICD-10-CM | POA: Diagnosis not present

## 2021-06-25 DIAGNOSIS — R41841 Cognitive communication deficit: Secondary | ICD-10-CM | POA: Diagnosis not present

## 2021-06-25 DIAGNOSIS — N3946 Mixed incontinence: Secondary | ICD-10-CM | POA: Diagnosis not present

## 2021-06-25 DIAGNOSIS — N183 Chronic kidney disease, stage 3 unspecified: Secondary | ICD-10-CM | POA: Diagnosis not present

## 2021-06-25 DIAGNOSIS — G309 Alzheimer's disease, unspecified: Secondary | ICD-10-CM | POA: Diagnosis not present

## 2021-06-25 DIAGNOSIS — R262 Difficulty in walking, not elsewhere classified: Secondary | ICD-10-CM | POA: Diagnosis not present

## 2021-06-25 DIAGNOSIS — R1312 Dysphagia, oropharyngeal phase: Secondary | ICD-10-CM | POA: Diagnosis not present

## 2021-06-25 DIAGNOSIS — N3281 Overactive bladder: Secondary | ICD-10-CM | POA: Diagnosis not present

## 2021-06-25 DIAGNOSIS — R2681 Unsteadiness on feet: Secondary | ICD-10-CM | POA: Diagnosis not present

## 2021-06-25 DIAGNOSIS — F0281 Dementia in other diseases classified elsewhere with behavioral disturbance: Secondary | ICD-10-CM | POA: Diagnosis not present

## 2021-06-27 DIAGNOSIS — R41841 Cognitive communication deficit: Secondary | ICD-10-CM | POA: Diagnosis not present

## 2021-06-27 DIAGNOSIS — R262 Difficulty in walking, not elsewhere classified: Secondary | ICD-10-CM | POA: Diagnosis not present

## 2021-06-27 DIAGNOSIS — N3946 Mixed incontinence: Secondary | ICD-10-CM | POA: Diagnosis not present

## 2021-06-27 DIAGNOSIS — M6281 Muscle weakness (generalized): Secondary | ICD-10-CM | POA: Diagnosis not present

## 2021-06-27 DIAGNOSIS — R1312 Dysphagia, oropharyngeal phase: Secondary | ICD-10-CM | POA: Diagnosis not present

## 2021-06-27 DIAGNOSIS — R2681 Unsteadiness on feet: Secondary | ICD-10-CM | POA: Diagnosis not present

## 2021-06-28 DIAGNOSIS — N183 Chronic kidney disease, stage 3 unspecified: Secondary | ICD-10-CM | POA: Diagnosis not present

## 2021-06-28 DIAGNOSIS — E1122 Type 2 diabetes mellitus with diabetic chronic kidney disease: Secondary | ICD-10-CM | POA: Diagnosis not present

## 2021-06-28 DIAGNOSIS — F331 Major depressive disorder, recurrent, moderate: Secondary | ICD-10-CM | POA: Diagnosis not present

## 2021-06-28 DIAGNOSIS — I130 Hypertensive heart and chronic kidney disease with heart failure and stage 1 through stage 4 chronic kidney disease, or unspecified chronic kidney disease: Secondary | ICD-10-CM | POA: Diagnosis not present

## 2021-06-28 DIAGNOSIS — E785 Hyperlipidemia, unspecified: Secondary | ICD-10-CM | POA: Diagnosis not present

## 2021-06-28 DIAGNOSIS — D631 Anemia in chronic kidney disease: Secondary | ICD-10-CM | POA: Diagnosis not present

## 2021-06-28 DIAGNOSIS — I5030 Unspecified diastolic (congestive) heart failure: Secondary | ICD-10-CM | POA: Diagnosis not present

## 2021-06-28 DIAGNOSIS — F09 Unspecified mental disorder due to known physiological condition: Secondary | ICD-10-CM | POA: Diagnosis not present

## 2021-07-01 DIAGNOSIS — N3946 Mixed incontinence: Secondary | ICD-10-CM | POA: Diagnosis not present

## 2021-07-01 DIAGNOSIS — R1312 Dysphagia, oropharyngeal phase: Secondary | ICD-10-CM | POA: Diagnosis not present

## 2021-07-01 DIAGNOSIS — M6281 Muscle weakness (generalized): Secondary | ICD-10-CM | POA: Diagnosis not present

## 2021-07-01 DIAGNOSIS — R41841 Cognitive communication deficit: Secondary | ICD-10-CM | POA: Diagnosis not present

## 2021-07-01 DIAGNOSIS — R262 Difficulty in walking, not elsewhere classified: Secondary | ICD-10-CM | POA: Diagnosis not present

## 2021-07-01 DIAGNOSIS — R2681 Unsteadiness on feet: Secondary | ICD-10-CM | POA: Diagnosis not present

## 2021-07-02 DIAGNOSIS — R262 Difficulty in walking, not elsewhere classified: Secondary | ICD-10-CM | POA: Diagnosis not present

## 2021-07-02 DIAGNOSIS — R1312 Dysphagia, oropharyngeal phase: Secondary | ICD-10-CM | POA: Diagnosis not present

## 2021-07-02 DIAGNOSIS — E1122 Type 2 diabetes mellitus with diabetic chronic kidney disease: Secondary | ICD-10-CM | POA: Diagnosis not present

## 2021-07-02 DIAGNOSIS — I5030 Unspecified diastolic (congestive) heart failure: Secondary | ICD-10-CM | POA: Diagnosis not present

## 2021-07-02 DIAGNOSIS — R2681 Unsteadiness on feet: Secondary | ICD-10-CM | POA: Diagnosis not present

## 2021-07-02 DIAGNOSIS — D631 Anemia in chronic kidney disease: Secondary | ICD-10-CM | POA: Diagnosis not present

## 2021-07-02 DIAGNOSIS — M6281 Muscle weakness (generalized): Secondary | ICD-10-CM | POA: Diagnosis not present

## 2021-07-02 DIAGNOSIS — N3946 Mixed incontinence: Secondary | ICD-10-CM | POA: Diagnosis not present

## 2021-07-02 DIAGNOSIS — E785 Hyperlipidemia, unspecified: Secondary | ICD-10-CM | POA: Diagnosis not present

## 2021-07-02 DIAGNOSIS — R41841 Cognitive communication deficit: Secondary | ICD-10-CM | POA: Diagnosis not present

## 2021-07-02 DIAGNOSIS — I130 Hypertensive heart and chronic kidney disease with heart failure and stage 1 through stage 4 chronic kidney disease, or unspecified chronic kidney disease: Secondary | ICD-10-CM | POA: Diagnosis not present

## 2021-07-02 DIAGNOSIS — N183 Chronic kidney disease, stage 3 unspecified: Secondary | ICD-10-CM | POA: Diagnosis not present

## 2021-07-03 DIAGNOSIS — R2681 Unsteadiness on feet: Secondary | ICD-10-CM | POA: Diagnosis not present

## 2021-07-03 DIAGNOSIS — M6281 Muscle weakness (generalized): Secondary | ICD-10-CM | POA: Diagnosis not present

## 2021-07-03 DIAGNOSIS — R1312 Dysphagia, oropharyngeal phase: Secondary | ICD-10-CM | POA: Diagnosis not present

## 2021-07-03 DIAGNOSIS — R41841 Cognitive communication deficit: Secondary | ICD-10-CM | POA: Diagnosis not present

## 2021-07-03 DIAGNOSIS — N3946 Mixed incontinence: Secondary | ICD-10-CM | POA: Diagnosis not present

## 2021-07-03 DIAGNOSIS — R262 Difficulty in walking, not elsewhere classified: Secondary | ICD-10-CM | POA: Diagnosis not present

## 2021-07-04 DIAGNOSIS — M6281 Muscle weakness (generalized): Secondary | ICD-10-CM | POA: Diagnosis not present

## 2021-07-04 DIAGNOSIS — R262 Difficulty in walking, not elsewhere classified: Secondary | ICD-10-CM | POA: Diagnosis not present

## 2021-07-04 DIAGNOSIS — R2681 Unsteadiness on feet: Secondary | ICD-10-CM | POA: Diagnosis not present

## 2021-07-04 DIAGNOSIS — R41841 Cognitive communication deficit: Secondary | ICD-10-CM | POA: Diagnosis not present

## 2021-07-04 DIAGNOSIS — N3946 Mixed incontinence: Secondary | ICD-10-CM | POA: Diagnosis not present

## 2021-07-04 DIAGNOSIS — R1312 Dysphagia, oropharyngeal phase: Secondary | ICD-10-CM | POA: Diagnosis not present

## 2021-07-05 DIAGNOSIS — N183 Chronic kidney disease, stage 3 unspecified: Secondary | ICD-10-CM | POA: Diagnosis not present

## 2021-07-05 DIAGNOSIS — E785 Hyperlipidemia, unspecified: Secondary | ICD-10-CM | POA: Diagnosis not present

## 2021-07-05 DIAGNOSIS — I5030 Unspecified diastolic (congestive) heart failure: Secondary | ICD-10-CM | POA: Diagnosis not present

## 2021-07-05 DIAGNOSIS — I130 Hypertensive heart and chronic kidney disease with heart failure and stage 1 through stage 4 chronic kidney disease, or unspecified chronic kidney disease: Secondary | ICD-10-CM | POA: Diagnosis not present

## 2021-07-05 DIAGNOSIS — E1122 Type 2 diabetes mellitus with diabetic chronic kidney disease: Secondary | ICD-10-CM | POA: Diagnosis not present

## 2021-07-05 DIAGNOSIS — R262 Difficulty in walking, not elsewhere classified: Secondary | ICD-10-CM | POA: Diagnosis not present

## 2021-07-05 DIAGNOSIS — N3946 Mixed incontinence: Secondary | ICD-10-CM | POA: Diagnosis not present

## 2021-07-05 DIAGNOSIS — M6281 Muscle weakness (generalized): Secondary | ICD-10-CM | POA: Diagnosis not present

## 2021-07-05 DIAGNOSIS — R41841 Cognitive communication deficit: Secondary | ICD-10-CM | POA: Diagnosis not present

## 2021-07-05 DIAGNOSIS — R1312 Dysphagia, oropharyngeal phase: Secondary | ICD-10-CM | POA: Diagnosis not present

## 2021-07-05 DIAGNOSIS — R2681 Unsteadiness on feet: Secondary | ICD-10-CM | POA: Diagnosis not present

## 2021-07-05 DIAGNOSIS — D631 Anemia in chronic kidney disease: Secondary | ICD-10-CM | POA: Diagnosis not present

## 2021-07-08 DIAGNOSIS — R262 Difficulty in walking, not elsewhere classified: Secondary | ICD-10-CM | POA: Diagnosis not present

## 2021-07-08 DIAGNOSIS — N3946 Mixed incontinence: Secondary | ICD-10-CM | POA: Diagnosis not present

## 2021-07-08 DIAGNOSIS — M6281 Muscle weakness (generalized): Secondary | ICD-10-CM | POA: Diagnosis not present

## 2021-07-08 DIAGNOSIS — R41841 Cognitive communication deficit: Secondary | ICD-10-CM | POA: Diagnosis not present

## 2021-07-08 DIAGNOSIS — R1312 Dysphagia, oropharyngeal phase: Secondary | ICD-10-CM | POA: Diagnosis not present

## 2021-07-08 DIAGNOSIS — R2681 Unsteadiness on feet: Secondary | ICD-10-CM | POA: Diagnosis not present

## 2021-07-09 DIAGNOSIS — I5030 Unspecified diastolic (congestive) heart failure: Secondary | ICD-10-CM | POA: Diagnosis not present

## 2021-07-09 DIAGNOSIS — R1312 Dysphagia, oropharyngeal phase: Secondary | ICD-10-CM | POA: Diagnosis not present

## 2021-07-09 DIAGNOSIS — E1122 Type 2 diabetes mellitus with diabetic chronic kidney disease: Secondary | ICD-10-CM | POA: Diagnosis not present

## 2021-07-09 DIAGNOSIS — R2681 Unsteadiness on feet: Secondary | ICD-10-CM | POA: Diagnosis not present

## 2021-07-09 DIAGNOSIS — R262 Difficulty in walking, not elsewhere classified: Secondary | ICD-10-CM | POA: Diagnosis not present

## 2021-07-09 DIAGNOSIS — M6281 Muscle weakness (generalized): Secondary | ICD-10-CM | POA: Diagnosis not present

## 2021-07-09 DIAGNOSIS — I130 Hypertensive heart and chronic kidney disease with heart failure and stage 1 through stage 4 chronic kidney disease, or unspecified chronic kidney disease: Secondary | ICD-10-CM | POA: Diagnosis not present

## 2021-07-09 DIAGNOSIS — D631 Anemia in chronic kidney disease: Secondary | ICD-10-CM | POA: Diagnosis not present

## 2021-07-09 DIAGNOSIS — E785 Hyperlipidemia, unspecified: Secondary | ICD-10-CM | POA: Diagnosis not present

## 2021-07-09 DIAGNOSIS — N3946 Mixed incontinence: Secondary | ICD-10-CM | POA: Diagnosis not present

## 2021-07-09 DIAGNOSIS — N183 Chronic kidney disease, stage 3 unspecified: Secondary | ICD-10-CM | POA: Diagnosis not present

## 2021-07-09 DIAGNOSIS — R41841 Cognitive communication deficit: Secondary | ICD-10-CM | POA: Diagnosis not present

## 2021-07-10 DIAGNOSIS — N3946 Mixed incontinence: Secondary | ICD-10-CM | POA: Diagnosis not present

## 2021-07-10 DIAGNOSIS — R1312 Dysphagia, oropharyngeal phase: Secondary | ICD-10-CM | POA: Diagnosis not present

## 2021-07-10 DIAGNOSIS — R2681 Unsteadiness on feet: Secondary | ICD-10-CM | POA: Diagnosis not present

## 2021-07-10 DIAGNOSIS — Z23 Encounter for immunization: Secondary | ICD-10-CM | POA: Diagnosis not present

## 2021-07-10 DIAGNOSIS — M6281 Muscle weakness (generalized): Secondary | ICD-10-CM | POA: Diagnosis not present

## 2021-07-10 DIAGNOSIS — R262 Difficulty in walking, not elsewhere classified: Secondary | ICD-10-CM | POA: Diagnosis not present

## 2021-07-10 DIAGNOSIS — R41841 Cognitive communication deficit: Secondary | ICD-10-CM | POA: Diagnosis not present

## 2021-07-11 DIAGNOSIS — R2681 Unsteadiness on feet: Secondary | ICD-10-CM | POA: Diagnosis not present

## 2021-07-11 DIAGNOSIS — R1312 Dysphagia, oropharyngeal phase: Secondary | ICD-10-CM | POA: Diagnosis not present

## 2021-07-11 DIAGNOSIS — N3946 Mixed incontinence: Secondary | ICD-10-CM | POA: Diagnosis not present

## 2021-07-11 DIAGNOSIS — R41841 Cognitive communication deficit: Secondary | ICD-10-CM | POA: Diagnosis not present

## 2021-07-11 DIAGNOSIS — R262 Difficulty in walking, not elsewhere classified: Secondary | ICD-10-CM | POA: Diagnosis not present

## 2021-07-11 DIAGNOSIS — M6281 Muscle weakness (generalized): Secondary | ICD-10-CM | POA: Diagnosis not present

## 2021-07-12 DIAGNOSIS — R262 Difficulty in walking, not elsewhere classified: Secondary | ICD-10-CM | POA: Diagnosis not present

## 2021-07-12 DIAGNOSIS — R1312 Dysphagia, oropharyngeal phase: Secondary | ICD-10-CM | POA: Diagnosis not present

## 2021-07-12 DIAGNOSIS — N3946 Mixed incontinence: Secondary | ICD-10-CM | POA: Diagnosis not present

## 2021-07-12 DIAGNOSIS — M6281 Muscle weakness (generalized): Secondary | ICD-10-CM | POA: Diagnosis not present

## 2021-07-12 DIAGNOSIS — R41841 Cognitive communication deficit: Secondary | ICD-10-CM | POA: Diagnosis not present

## 2021-07-12 DIAGNOSIS — R2681 Unsteadiness on feet: Secondary | ICD-10-CM | POA: Diagnosis not present

## 2021-07-13 DIAGNOSIS — F32A Depression, unspecified: Secondary | ICD-10-CM | POA: Diagnosis not present

## 2021-07-13 DIAGNOSIS — K219 Gastro-esophageal reflux disease without esophagitis: Secondary | ICD-10-CM | POA: Diagnosis not present

## 2021-07-13 DIAGNOSIS — F0280 Dementia in other diseases classified elsewhere without behavioral disturbance: Secondary | ICD-10-CM | POA: Diagnosis not present

## 2021-07-13 DIAGNOSIS — N400 Enlarged prostate without lower urinary tract symptoms: Secondary | ICD-10-CM | POA: Diagnosis not present

## 2021-07-13 DIAGNOSIS — E785 Hyperlipidemia, unspecified: Secondary | ICD-10-CM | POA: Diagnosis not present

## 2021-07-13 DIAGNOSIS — M1990 Unspecified osteoarthritis, unspecified site: Secondary | ICD-10-CM | POA: Diagnosis not present

## 2021-07-13 DIAGNOSIS — I130 Hypertensive heart and chronic kidney disease with heart failure and stage 1 through stage 4 chronic kidney disease, or unspecified chronic kidney disease: Secondary | ICD-10-CM | POA: Diagnosis not present

## 2021-07-13 DIAGNOSIS — E1122 Type 2 diabetes mellitus with diabetic chronic kidney disease: Secondary | ICD-10-CM | POA: Diagnosis not present

## 2021-07-13 DIAGNOSIS — Z87442 Personal history of urinary calculi: Secondary | ICD-10-CM | POA: Diagnosis not present

## 2021-07-13 DIAGNOSIS — I350 Nonrheumatic aortic (valve) stenosis: Secondary | ICD-10-CM | POA: Diagnosis not present

## 2021-07-13 DIAGNOSIS — F988 Other specified behavioral and emotional disorders with onset usually occurring in childhood and adolescence: Secondary | ICD-10-CM | POA: Diagnosis not present

## 2021-07-13 DIAGNOSIS — I447 Left bundle-branch block, unspecified: Secondary | ICD-10-CM | POA: Diagnosis not present

## 2021-07-13 DIAGNOSIS — I5030 Unspecified diastolic (congestive) heart failure: Secondary | ICD-10-CM | POA: Diagnosis not present

## 2021-07-13 DIAGNOSIS — I4891 Unspecified atrial fibrillation: Secondary | ICD-10-CM | POA: Diagnosis not present

## 2021-07-13 DIAGNOSIS — N183 Chronic kidney disease, stage 3 unspecified: Secondary | ICD-10-CM | POA: Diagnosis not present

## 2021-07-13 DIAGNOSIS — R55 Syncope and collapse: Secondary | ICD-10-CM | POA: Diagnosis not present

## 2021-07-13 DIAGNOSIS — R296 Repeated falls: Secondary | ICD-10-CM | POA: Diagnosis not present

## 2021-07-13 DIAGNOSIS — G309 Alzheimer's disease, unspecified: Secondary | ICD-10-CM | POA: Diagnosis not present

## 2021-07-13 DIAGNOSIS — H9190 Unspecified hearing loss, unspecified ear: Secondary | ICD-10-CM | POA: Diagnosis not present

## 2021-07-13 DIAGNOSIS — D631 Anemia in chronic kidney disease: Secondary | ICD-10-CM | POA: Diagnosis not present

## 2021-07-13 DIAGNOSIS — F419 Anxiety disorder, unspecified: Secondary | ICD-10-CM | POA: Diagnosis not present

## 2021-07-15 DIAGNOSIS — R488 Other symbolic dysfunctions: Secondary | ICD-10-CM | POA: Diagnosis not present

## 2021-07-15 DIAGNOSIS — R262 Difficulty in walking, not elsewhere classified: Secondary | ICD-10-CM | POA: Diagnosis not present

## 2021-07-15 DIAGNOSIS — R1312 Dysphagia, oropharyngeal phase: Secondary | ICD-10-CM | POA: Diagnosis not present

## 2021-07-15 DIAGNOSIS — R296 Repeated falls: Secondary | ICD-10-CM | POA: Diagnosis not present

## 2021-07-15 DIAGNOSIS — R41841 Cognitive communication deficit: Secondary | ICD-10-CM | POA: Diagnosis not present

## 2021-07-15 DIAGNOSIS — M6281 Muscle weakness (generalized): Secondary | ICD-10-CM | POA: Diagnosis not present

## 2021-07-15 DIAGNOSIS — R2681 Unsteadiness on feet: Secondary | ICD-10-CM | POA: Diagnosis not present

## 2021-07-16 DIAGNOSIS — D631 Anemia in chronic kidney disease: Secondary | ICD-10-CM | POA: Diagnosis not present

## 2021-07-16 DIAGNOSIS — I130 Hypertensive heart and chronic kidney disease with heart failure and stage 1 through stage 4 chronic kidney disease, or unspecified chronic kidney disease: Secondary | ICD-10-CM | POA: Diagnosis not present

## 2021-07-16 DIAGNOSIS — I5030 Unspecified diastolic (congestive) heart failure: Secondary | ICD-10-CM | POA: Diagnosis not present

## 2021-07-16 DIAGNOSIS — E1122 Type 2 diabetes mellitus with diabetic chronic kidney disease: Secondary | ICD-10-CM | POA: Diagnosis not present

## 2021-07-16 DIAGNOSIS — N183 Chronic kidney disease, stage 3 unspecified: Secondary | ICD-10-CM | POA: Diagnosis not present

## 2021-07-16 DIAGNOSIS — E785 Hyperlipidemia, unspecified: Secondary | ICD-10-CM | POA: Diagnosis not present

## 2021-07-18 DIAGNOSIS — F09 Unspecified mental disorder due to known physiological condition: Secondary | ICD-10-CM | POA: Diagnosis not present

## 2021-07-18 DIAGNOSIS — F331 Major depressive disorder, recurrent, moderate: Secondary | ICD-10-CM | POA: Diagnosis not present

## 2021-07-19 DIAGNOSIS — N183 Chronic kidney disease, stage 3 unspecified: Secondary | ICD-10-CM | POA: Diagnosis not present

## 2021-07-19 DIAGNOSIS — R262 Difficulty in walking, not elsewhere classified: Secondary | ICD-10-CM | POA: Diagnosis not present

## 2021-07-19 DIAGNOSIS — I5030 Unspecified diastolic (congestive) heart failure: Secondary | ICD-10-CM | POA: Diagnosis not present

## 2021-07-19 DIAGNOSIS — D631 Anemia in chronic kidney disease: Secondary | ICD-10-CM | POA: Diagnosis not present

## 2021-07-19 DIAGNOSIS — E1122 Type 2 diabetes mellitus with diabetic chronic kidney disease: Secondary | ICD-10-CM | POA: Diagnosis not present

## 2021-07-19 DIAGNOSIS — M6281 Muscle weakness (generalized): Secondary | ICD-10-CM | POA: Diagnosis not present

## 2021-07-19 DIAGNOSIS — I130 Hypertensive heart and chronic kidney disease with heart failure and stage 1 through stage 4 chronic kidney disease, or unspecified chronic kidney disease: Secondary | ICD-10-CM | POA: Diagnosis not present

## 2021-07-19 DIAGNOSIS — R2681 Unsteadiness on feet: Secondary | ICD-10-CM | POA: Diagnosis not present

## 2021-07-19 DIAGNOSIS — R296 Repeated falls: Secondary | ICD-10-CM | POA: Diagnosis not present

## 2021-07-19 DIAGNOSIS — R1312 Dysphagia, oropharyngeal phase: Secondary | ICD-10-CM | POA: Diagnosis not present

## 2021-07-19 DIAGNOSIS — R41841 Cognitive communication deficit: Secondary | ICD-10-CM | POA: Diagnosis not present

## 2021-07-19 DIAGNOSIS — E785 Hyperlipidemia, unspecified: Secondary | ICD-10-CM | POA: Diagnosis not present

## 2021-07-20 DIAGNOSIS — E785 Hyperlipidemia, unspecified: Secondary | ICD-10-CM | POA: Diagnosis not present

## 2021-07-20 DIAGNOSIS — I5030 Unspecified diastolic (congestive) heart failure: Secondary | ICD-10-CM | POA: Diagnosis not present

## 2021-07-20 DIAGNOSIS — I130 Hypertensive heart and chronic kidney disease with heart failure and stage 1 through stage 4 chronic kidney disease, or unspecified chronic kidney disease: Secondary | ICD-10-CM | POA: Diagnosis not present

## 2021-07-20 DIAGNOSIS — E1122 Type 2 diabetes mellitus with diabetic chronic kidney disease: Secondary | ICD-10-CM | POA: Diagnosis not present

## 2021-07-20 DIAGNOSIS — N183 Chronic kidney disease, stage 3 unspecified: Secondary | ICD-10-CM | POA: Diagnosis not present

## 2021-07-20 DIAGNOSIS — D631 Anemia in chronic kidney disease: Secondary | ICD-10-CM | POA: Diagnosis not present

## 2021-07-22 DIAGNOSIS — R41841 Cognitive communication deficit: Secondary | ICD-10-CM | POA: Diagnosis not present

## 2021-07-22 DIAGNOSIS — R296 Repeated falls: Secondary | ICD-10-CM | POA: Diagnosis not present

## 2021-07-22 DIAGNOSIS — R2681 Unsteadiness on feet: Secondary | ICD-10-CM | POA: Diagnosis not present

## 2021-07-22 DIAGNOSIS — R1312 Dysphagia, oropharyngeal phase: Secondary | ICD-10-CM | POA: Diagnosis not present

## 2021-07-22 DIAGNOSIS — M6281 Muscle weakness (generalized): Secondary | ICD-10-CM | POA: Diagnosis not present

## 2021-07-22 DIAGNOSIS — R262 Difficulty in walking, not elsewhere classified: Secondary | ICD-10-CM | POA: Diagnosis not present

## 2021-07-23 DIAGNOSIS — M6281 Muscle weakness (generalized): Secondary | ICD-10-CM | POA: Diagnosis not present

## 2021-07-23 DIAGNOSIS — N3281 Overactive bladder: Secondary | ICD-10-CM | POA: Diagnosis not present

## 2021-07-23 DIAGNOSIS — G309 Alzheimer's disease, unspecified: Secondary | ICD-10-CM | POA: Diagnosis not present

## 2021-07-23 DIAGNOSIS — Z79899 Other long term (current) drug therapy: Secondary | ICD-10-CM | POA: Diagnosis not present

## 2021-07-23 DIAGNOSIS — R262 Difficulty in walking, not elsewhere classified: Secondary | ICD-10-CM | POA: Diagnosis not present

## 2021-07-23 DIAGNOSIS — N183 Chronic kidney disease, stage 3 unspecified: Secondary | ICD-10-CM | POA: Diagnosis not present

## 2021-07-23 DIAGNOSIS — E1159 Type 2 diabetes mellitus with other circulatory complications: Secondary | ICD-10-CM | POA: Diagnosis not present

## 2021-07-23 DIAGNOSIS — R1312 Dysphagia, oropharyngeal phase: Secondary | ICD-10-CM | POA: Diagnosis not present

## 2021-07-23 DIAGNOSIS — E785 Hyperlipidemia, unspecified: Secondary | ICD-10-CM | POA: Diagnosis not present

## 2021-07-23 DIAGNOSIS — R41841 Cognitive communication deficit: Secondary | ICD-10-CM | POA: Diagnosis not present

## 2021-07-23 DIAGNOSIS — D631 Anemia in chronic kidney disease: Secondary | ICD-10-CM | POA: Diagnosis not present

## 2021-07-23 DIAGNOSIS — D509 Iron deficiency anemia, unspecified: Secondary | ICD-10-CM | POA: Diagnosis not present

## 2021-07-23 DIAGNOSIS — R2681 Unsteadiness on feet: Secondary | ICD-10-CM | POA: Diagnosis not present

## 2021-07-23 DIAGNOSIS — E1122 Type 2 diabetes mellitus with diabetic chronic kidney disease: Secondary | ICD-10-CM | POA: Diagnosis not present

## 2021-07-23 DIAGNOSIS — I5030 Unspecified diastolic (congestive) heart failure: Secondary | ICD-10-CM | POA: Diagnosis not present

## 2021-07-23 DIAGNOSIS — R296 Repeated falls: Secondary | ICD-10-CM | POA: Diagnosis not present

## 2021-07-23 DIAGNOSIS — I130 Hypertensive heart and chronic kidney disease with heart failure and stage 1 through stage 4 chronic kidney disease, or unspecified chronic kidney disease: Secondary | ICD-10-CM | POA: Diagnosis not present

## 2021-07-24 DIAGNOSIS — R262 Difficulty in walking, not elsewhere classified: Secondary | ICD-10-CM | POA: Diagnosis not present

## 2021-07-24 DIAGNOSIS — M6281 Muscle weakness (generalized): Secondary | ICD-10-CM | POA: Diagnosis not present

## 2021-07-24 DIAGNOSIS — R1312 Dysphagia, oropharyngeal phase: Secondary | ICD-10-CM | POA: Diagnosis not present

## 2021-07-24 DIAGNOSIS — R41841 Cognitive communication deficit: Secondary | ICD-10-CM | POA: Diagnosis not present

## 2021-07-24 DIAGNOSIS — R296 Repeated falls: Secondary | ICD-10-CM | POA: Diagnosis not present

## 2021-07-24 DIAGNOSIS — R2681 Unsteadiness on feet: Secondary | ICD-10-CM | POA: Diagnosis not present

## 2021-07-26 DIAGNOSIS — I5030 Unspecified diastolic (congestive) heart failure: Secondary | ICD-10-CM | POA: Diagnosis not present

## 2021-07-26 DIAGNOSIS — E785 Hyperlipidemia, unspecified: Secondary | ICD-10-CM | POA: Diagnosis not present

## 2021-07-26 DIAGNOSIS — I130 Hypertensive heart and chronic kidney disease with heart failure and stage 1 through stage 4 chronic kidney disease, or unspecified chronic kidney disease: Secondary | ICD-10-CM | POA: Diagnosis not present

## 2021-07-26 DIAGNOSIS — M6281 Muscle weakness (generalized): Secondary | ICD-10-CM | POA: Diagnosis not present

## 2021-07-26 DIAGNOSIS — E1122 Type 2 diabetes mellitus with diabetic chronic kidney disease: Secondary | ICD-10-CM | POA: Diagnosis not present

## 2021-07-26 DIAGNOSIS — R1312 Dysphagia, oropharyngeal phase: Secondary | ICD-10-CM | POA: Diagnosis not present

## 2021-07-26 DIAGNOSIS — N183 Chronic kidney disease, stage 3 unspecified: Secondary | ICD-10-CM | POA: Diagnosis not present

## 2021-07-26 DIAGNOSIS — R41841 Cognitive communication deficit: Secondary | ICD-10-CM | POA: Diagnosis not present

## 2021-07-26 DIAGNOSIS — R2681 Unsteadiness on feet: Secondary | ICD-10-CM | POA: Diagnosis not present

## 2021-07-26 DIAGNOSIS — R262 Difficulty in walking, not elsewhere classified: Secondary | ICD-10-CM | POA: Diagnosis not present

## 2021-07-26 DIAGNOSIS — R296 Repeated falls: Secondary | ICD-10-CM | POA: Diagnosis not present

## 2021-07-26 DIAGNOSIS — D631 Anemia in chronic kidney disease: Secondary | ICD-10-CM | POA: Diagnosis not present

## 2021-07-30 DIAGNOSIS — F331 Major depressive disorder, recurrent, moderate: Secondary | ICD-10-CM | POA: Diagnosis not present

## 2021-07-31 DIAGNOSIS — M6281 Muscle weakness (generalized): Secondary | ICD-10-CM | POA: Diagnosis not present

## 2021-07-31 DIAGNOSIS — R1312 Dysphagia, oropharyngeal phase: Secondary | ICD-10-CM | POA: Diagnosis not present

## 2021-07-31 DIAGNOSIS — R296 Repeated falls: Secondary | ICD-10-CM | POA: Diagnosis not present

## 2021-07-31 DIAGNOSIS — R262 Difficulty in walking, not elsewhere classified: Secondary | ICD-10-CM | POA: Diagnosis not present

## 2021-07-31 DIAGNOSIS — R2681 Unsteadiness on feet: Secondary | ICD-10-CM | POA: Diagnosis not present

## 2021-07-31 DIAGNOSIS — R41841 Cognitive communication deficit: Secondary | ICD-10-CM | POA: Diagnosis not present

## 2021-08-01 DIAGNOSIS — I5030 Unspecified diastolic (congestive) heart failure: Secondary | ICD-10-CM | POA: Diagnosis not present

## 2021-08-01 DIAGNOSIS — N183 Chronic kidney disease, stage 3 unspecified: Secondary | ICD-10-CM | POA: Diagnosis not present

## 2021-08-01 DIAGNOSIS — E1122 Type 2 diabetes mellitus with diabetic chronic kidney disease: Secondary | ICD-10-CM | POA: Diagnosis not present

## 2021-08-01 DIAGNOSIS — I130 Hypertensive heart and chronic kidney disease with heart failure and stage 1 through stage 4 chronic kidney disease, or unspecified chronic kidney disease: Secondary | ICD-10-CM | POA: Diagnosis not present

## 2021-08-01 DIAGNOSIS — E785 Hyperlipidemia, unspecified: Secondary | ICD-10-CM | POA: Diagnosis not present

## 2021-08-01 DIAGNOSIS — D631 Anemia in chronic kidney disease: Secondary | ICD-10-CM | POA: Diagnosis not present

## 2021-08-02 DIAGNOSIS — R262 Difficulty in walking, not elsewhere classified: Secondary | ICD-10-CM | POA: Diagnosis not present

## 2021-08-02 DIAGNOSIS — R2681 Unsteadiness on feet: Secondary | ICD-10-CM | POA: Diagnosis not present

## 2021-08-02 DIAGNOSIS — R41841 Cognitive communication deficit: Secondary | ICD-10-CM | POA: Diagnosis not present

## 2021-08-02 DIAGNOSIS — R296 Repeated falls: Secondary | ICD-10-CM | POA: Diagnosis not present

## 2021-08-02 DIAGNOSIS — R1312 Dysphagia, oropharyngeal phase: Secondary | ICD-10-CM | POA: Diagnosis not present

## 2021-08-02 DIAGNOSIS — M6281 Muscle weakness (generalized): Secondary | ICD-10-CM | POA: Diagnosis not present

## 2021-08-06 DIAGNOSIS — N183 Chronic kidney disease, stage 3 unspecified: Secondary | ICD-10-CM | POA: Diagnosis not present

## 2021-08-06 DIAGNOSIS — R296 Repeated falls: Secondary | ICD-10-CM | POA: Diagnosis not present

## 2021-08-06 DIAGNOSIS — R262 Difficulty in walking, not elsewhere classified: Secondary | ICD-10-CM | POA: Diagnosis not present

## 2021-08-06 DIAGNOSIS — D631 Anemia in chronic kidney disease: Secondary | ICD-10-CM | POA: Diagnosis not present

## 2021-08-06 DIAGNOSIS — R2681 Unsteadiness on feet: Secondary | ICD-10-CM | POA: Diagnosis not present

## 2021-08-06 DIAGNOSIS — I5030 Unspecified diastolic (congestive) heart failure: Secondary | ICD-10-CM | POA: Diagnosis not present

## 2021-08-06 DIAGNOSIS — M6281 Muscle weakness (generalized): Secondary | ICD-10-CM | POA: Diagnosis not present

## 2021-08-06 DIAGNOSIS — R1312 Dysphagia, oropharyngeal phase: Secondary | ICD-10-CM | POA: Diagnosis not present

## 2021-08-06 DIAGNOSIS — R41841 Cognitive communication deficit: Secondary | ICD-10-CM | POA: Diagnosis not present

## 2021-08-06 DIAGNOSIS — E1122 Type 2 diabetes mellitus with diabetic chronic kidney disease: Secondary | ICD-10-CM | POA: Diagnosis not present

## 2021-08-06 DIAGNOSIS — E785 Hyperlipidemia, unspecified: Secondary | ICD-10-CM | POA: Diagnosis not present

## 2021-08-06 DIAGNOSIS — I130 Hypertensive heart and chronic kidney disease with heart failure and stage 1 through stage 4 chronic kidney disease, or unspecified chronic kidney disease: Secondary | ICD-10-CM | POA: Diagnosis not present

## 2021-08-07 DIAGNOSIS — D631 Anemia in chronic kidney disease: Secondary | ICD-10-CM | POA: Diagnosis not present

## 2021-08-07 DIAGNOSIS — I5030 Unspecified diastolic (congestive) heart failure: Secondary | ICD-10-CM | POA: Diagnosis not present

## 2021-08-07 DIAGNOSIS — E1122 Type 2 diabetes mellitus with diabetic chronic kidney disease: Secondary | ICD-10-CM | POA: Diagnosis not present

## 2021-08-07 DIAGNOSIS — I130 Hypertensive heart and chronic kidney disease with heart failure and stage 1 through stage 4 chronic kidney disease, or unspecified chronic kidney disease: Secondary | ICD-10-CM | POA: Diagnosis not present

## 2021-08-07 DIAGNOSIS — E785 Hyperlipidemia, unspecified: Secondary | ICD-10-CM | POA: Diagnosis not present

## 2021-08-07 DIAGNOSIS — N183 Chronic kidney disease, stage 3 unspecified: Secondary | ICD-10-CM | POA: Diagnosis not present

## 2021-08-09 DIAGNOSIS — R262 Difficulty in walking, not elsewhere classified: Secondary | ICD-10-CM | POA: Diagnosis not present

## 2021-08-09 DIAGNOSIS — R2681 Unsteadiness on feet: Secondary | ICD-10-CM | POA: Diagnosis not present

## 2021-08-09 DIAGNOSIS — R1312 Dysphagia, oropharyngeal phase: Secondary | ICD-10-CM | POA: Diagnosis not present

## 2021-08-09 DIAGNOSIS — R296 Repeated falls: Secondary | ICD-10-CM | POA: Diagnosis not present

## 2021-08-09 DIAGNOSIS — M6281 Muscle weakness (generalized): Secondary | ICD-10-CM | POA: Diagnosis not present

## 2021-08-09 DIAGNOSIS — R41841 Cognitive communication deficit: Secondary | ICD-10-CM | POA: Diagnosis not present

## 2021-08-12 DIAGNOSIS — R262 Difficulty in walking, not elsewhere classified: Secondary | ICD-10-CM | POA: Diagnosis not present

## 2021-08-12 DIAGNOSIS — R41841 Cognitive communication deficit: Secondary | ICD-10-CM | POA: Diagnosis not present

## 2021-08-12 DIAGNOSIS — M6281 Muscle weakness (generalized): Secondary | ICD-10-CM | POA: Diagnosis not present

## 2021-08-12 DIAGNOSIS — R296 Repeated falls: Secondary | ICD-10-CM | POA: Diagnosis not present

## 2021-08-12 DIAGNOSIS — R2681 Unsteadiness on feet: Secondary | ICD-10-CM | POA: Diagnosis not present

## 2021-08-12 DIAGNOSIS — R1312 Dysphagia, oropharyngeal phase: Secondary | ICD-10-CM | POA: Diagnosis not present

## 2021-08-13 DIAGNOSIS — N400 Enlarged prostate without lower urinary tract symptoms: Secondary | ICD-10-CM | POA: Diagnosis not present

## 2021-08-13 DIAGNOSIS — K219 Gastro-esophageal reflux disease without esophagitis: Secondary | ICD-10-CM | POA: Diagnosis not present

## 2021-08-13 DIAGNOSIS — E785 Hyperlipidemia, unspecified: Secondary | ICD-10-CM | POA: Diagnosis not present

## 2021-08-13 DIAGNOSIS — I5030 Unspecified diastolic (congestive) heart failure: Secondary | ICD-10-CM | POA: Diagnosis not present

## 2021-08-13 DIAGNOSIS — M1990 Unspecified osteoarthritis, unspecified site: Secondary | ICD-10-CM | POA: Diagnosis not present

## 2021-08-13 DIAGNOSIS — I350 Nonrheumatic aortic (valve) stenosis: Secondary | ICD-10-CM | POA: Diagnosis not present

## 2021-08-13 DIAGNOSIS — R296 Repeated falls: Secondary | ICD-10-CM | POA: Diagnosis not present

## 2021-08-13 DIAGNOSIS — H9190 Unspecified hearing loss, unspecified ear: Secondary | ICD-10-CM | POA: Diagnosis not present

## 2021-08-13 DIAGNOSIS — Z87442 Personal history of urinary calculi: Secondary | ICD-10-CM | POA: Diagnosis not present

## 2021-08-13 DIAGNOSIS — E1122 Type 2 diabetes mellitus with diabetic chronic kidney disease: Secondary | ICD-10-CM | POA: Diagnosis not present

## 2021-08-13 DIAGNOSIS — D631 Anemia in chronic kidney disease: Secondary | ICD-10-CM | POA: Diagnosis not present

## 2021-08-13 DIAGNOSIS — N183 Chronic kidney disease, stage 3 unspecified: Secondary | ICD-10-CM | POA: Diagnosis not present

## 2021-08-13 DIAGNOSIS — R55 Syncope and collapse: Secondary | ICD-10-CM | POA: Diagnosis not present

## 2021-08-13 DIAGNOSIS — I130 Hypertensive heart and chronic kidney disease with heart failure and stage 1 through stage 4 chronic kidney disease, or unspecified chronic kidney disease: Secondary | ICD-10-CM | POA: Diagnosis not present

## 2021-08-13 DIAGNOSIS — F419 Anxiety disorder, unspecified: Secondary | ICD-10-CM | POA: Diagnosis not present

## 2021-08-13 DIAGNOSIS — G309 Alzheimer's disease, unspecified: Secondary | ICD-10-CM | POA: Diagnosis not present

## 2021-08-13 DIAGNOSIS — F0280 Dementia in other diseases classified elsewhere without behavioral disturbance: Secondary | ICD-10-CM | POA: Diagnosis not present

## 2021-08-13 DIAGNOSIS — F988 Other specified behavioral and emotional disorders with onset usually occurring in childhood and adolescence: Secondary | ICD-10-CM | POA: Diagnosis not present

## 2021-08-13 DIAGNOSIS — F32A Depression, unspecified: Secondary | ICD-10-CM | POA: Diagnosis not present

## 2021-08-13 DIAGNOSIS — I447 Left bundle-branch block, unspecified: Secondary | ICD-10-CM | POA: Diagnosis not present

## 2021-08-13 DIAGNOSIS — I4891 Unspecified atrial fibrillation: Secondary | ICD-10-CM | POA: Diagnosis not present

## 2021-08-14 DIAGNOSIS — E1122 Type 2 diabetes mellitus with diabetic chronic kidney disease: Secondary | ICD-10-CM | POA: Diagnosis not present

## 2021-08-14 DIAGNOSIS — E785 Hyperlipidemia, unspecified: Secondary | ICD-10-CM | POA: Diagnosis not present

## 2021-08-14 DIAGNOSIS — F331 Major depressive disorder, recurrent, moderate: Secondary | ICD-10-CM | POA: Diagnosis not present

## 2021-08-14 DIAGNOSIS — D631 Anemia in chronic kidney disease: Secondary | ICD-10-CM | POA: Diagnosis not present

## 2021-08-14 DIAGNOSIS — N183 Chronic kidney disease, stage 3 unspecified: Secondary | ICD-10-CM | POA: Diagnosis not present

## 2021-08-14 DIAGNOSIS — I5030 Unspecified diastolic (congestive) heart failure: Secondary | ICD-10-CM | POA: Diagnosis not present

## 2021-08-14 DIAGNOSIS — F09 Unspecified mental disorder due to known physiological condition: Secondary | ICD-10-CM | POA: Diagnosis not present

## 2021-08-14 DIAGNOSIS — I130 Hypertensive heart and chronic kidney disease with heart failure and stage 1 through stage 4 chronic kidney disease, or unspecified chronic kidney disease: Secondary | ICD-10-CM | POA: Diagnosis not present

## 2021-08-15 DIAGNOSIS — R488 Other symbolic dysfunctions: Secondary | ICD-10-CM | POA: Diagnosis not present

## 2021-08-15 DIAGNOSIS — R41841 Cognitive communication deficit: Secondary | ICD-10-CM | POA: Diagnosis not present

## 2021-08-15 DIAGNOSIS — R1312 Dysphagia, oropharyngeal phase: Secondary | ICD-10-CM | POA: Diagnosis not present

## 2021-08-19 DIAGNOSIS — N183 Chronic kidney disease, stage 3 unspecified: Secondary | ICD-10-CM | POA: Diagnosis not present

## 2021-08-19 DIAGNOSIS — I130 Hypertensive heart and chronic kidney disease with heart failure and stage 1 through stage 4 chronic kidney disease, or unspecified chronic kidney disease: Secondary | ICD-10-CM | POA: Diagnosis not present

## 2021-08-19 DIAGNOSIS — E1122 Type 2 diabetes mellitus with diabetic chronic kidney disease: Secondary | ICD-10-CM | POA: Diagnosis not present

## 2021-08-19 DIAGNOSIS — E785 Hyperlipidemia, unspecified: Secondary | ICD-10-CM | POA: Diagnosis not present

## 2021-08-19 DIAGNOSIS — I5030 Unspecified diastolic (congestive) heart failure: Secondary | ICD-10-CM | POA: Diagnosis not present

## 2021-08-19 DIAGNOSIS — D631 Anemia in chronic kidney disease: Secondary | ICD-10-CM | POA: Diagnosis not present

## 2021-08-20 DIAGNOSIS — Z9181 History of falling: Secondary | ICD-10-CM | POA: Diagnosis not present

## 2021-08-20 DIAGNOSIS — F331 Major depressive disorder, recurrent, moderate: Secondary | ICD-10-CM | POA: Diagnosis not present

## 2021-08-20 DIAGNOSIS — E1159 Type 2 diabetes mellitus with other circulatory complications: Secondary | ICD-10-CM | POA: Diagnosis not present

## 2021-08-20 DIAGNOSIS — R627 Adult failure to thrive: Secondary | ICD-10-CM | POA: Diagnosis not present

## 2021-08-20 DIAGNOSIS — E785 Hyperlipidemia, unspecified: Secondary | ICD-10-CM | POA: Diagnosis not present

## 2021-08-20 DIAGNOSIS — G309 Alzheimer's disease, unspecified: Secondary | ICD-10-CM | POA: Diagnosis not present

## 2021-08-20 DIAGNOSIS — I130 Hypertensive heart and chronic kidney disease with heart failure and stage 1 through stage 4 chronic kidney disease, or unspecified chronic kidney disease: Secondary | ICD-10-CM | POA: Diagnosis not present

## 2021-08-20 DIAGNOSIS — N183 Chronic kidney disease, stage 3 unspecified: Secondary | ICD-10-CM | POA: Diagnosis not present

## 2021-08-20 DIAGNOSIS — E1122 Type 2 diabetes mellitus with diabetic chronic kidney disease: Secondary | ICD-10-CM | POA: Diagnosis not present

## 2021-08-20 DIAGNOSIS — I5030 Unspecified diastolic (congestive) heart failure: Secondary | ICD-10-CM | POA: Diagnosis not present

## 2021-08-20 DIAGNOSIS — D631 Anemia in chronic kidney disease: Secondary | ICD-10-CM | POA: Diagnosis not present

## 2021-08-21 DIAGNOSIS — R1312 Dysphagia, oropharyngeal phase: Secondary | ICD-10-CM | POA: Diagnosis not present

## 2021-08-21 DIAGNOSIS — R488 Other symbolic dysfunctions: Secondary | ICD-10-CM | POA: Diagnosis not present

## 2021-08-21 DIAGNOSIS — R41841 Cognitive communication deficit: Secondary | ICD-10-CM | POA: Diagnosis not present

## 2021-08-22 DIAGNOSIS — E785 Hyperlipidemia, unspecified: Secondary | ICD-10-CM | POA: Diagnosis not present

## 2021-08-22 DIAGNOSIS — D631 Anemia in chronic kidney disease: Secondary | ICD-10-CM | POA: Diagnosis not present

## 2021-08-22 DIAGNOSIS — I130 Hypertensive heart and chronic kidney disease with heart failure and stage 1 through stage 4 chronic kidney disease, or unspecified chronic kidney disease: Secondary | ICD-10-CM | POA: Diagnosis not present

## 2021-08-22 DIAGNOSIS — E1122 Type 2 diabetes mellitus with diabetic chronic kidney disease: Secondary | ICD-10-CM | POA: Diagnosis not present

## 2021-08-22 DIAGNOSIS — N183 Chronic kidney disease, stage 3 unspecified: Secondary | ICD-10-CM | POA: Diagnosis not present

## 2021-08-22 DIAGNOSIS — I5030 Unspecified diastolic (congestive) heart failure: Secondary | ICD-10-CM | POA: Diagnosis not present

## 2021-08-26 DIAGNOSIS — N183 Chronic kidney disease, stage 3 unspecified: Secondary | ICD-10-CM | POA: Diagnosis not present

## 2021-08-26 DIAGNOSIS — E1122 Type 2 diabetes mellitus with diabetic chronic kidney disease: Secondary | ICD-10-CM | POA: Diagnosis not present

## 2021-08-26 DIAGNOSIS — R41841 Cognitive communication deficit: Secondary | ICD-10-CM | POA: Diagnosis not present

## 2021-08-26 DIAGNOSIS — D631 Anemia in chronic kidney disease: Secondary | ICD-10-CM | POA: Diagnosis not present

## 2021-08-26 DIAGNOSIS — I130 Hypertensive heart and chronic kidney disease with heart failure and stage 1 through stage 4 chronic kidney disease, or unspecified chronic kidney disease: Secondary | ICD-10-CM | POA: Diagnosis not present

## 2021-08-26 DIAGNOSIS — R488 Other symbolic dysfunctions: Secondary | ICD-10-CM | POA: Diagnosis not present

## 2021-08-26 DIAGNOSIS — I5030 Unspecified diastolic (congestive) heart failure: Secondary | ICD-10-CM | POA: Diagnosis not present

## 2021-08-26 DIAGNOSIS — E785 Hyperlipidemia, unspecified: Secondary | ICD-10-CM | POA: Diagnosis not present

## 2021-08-26 DIAGNOSIS — R1312 Dysphagia, oropharyngeal phase: Secondary | ICD-10-CM | POA: Diagnosis not present

## 2021-08-27 DIAGNOSIS — I5030 Unspecified diastolic (congestive) heart failure: Secondary | ICD-10-CM | POA: Diagnosis not present

## 2021-08-27 DIAGNOSIS — E785 Hyperlipidemia, unspecified: Secondary | ICD-10-CM | POA: Diagnosis not present

## 2021-08-27 DIAGNOSIS — N183 Chronic kidney disease, stage 3 unspecified: Secondary | ICD-10-CM | POA: Diagnosis not present

## 2021-08-27 DIAGNOSIS — E1122 Type 2 diabetes mellitus with diabetic chronic kidney disease: Secondary | ICD-10-CM | POA: Diagnosis not present

## 2021-08-27 DIAGNOSIS — D631 Anemia in chronic kidney disease: Secondary | ICD-10-CM | POA: Diagnosis not present

## 2021-08-27 DIAGNOSIS — I130 Hypertensive heart and chronic kidney disease with heart failure and stage 1 through stage 4 chronic kidney disease, or unspecified chronic kidney disease: Secondary | ICD-10-CM | POA: Diagnosis not present

## 2021-08-28 DIAGNOSIS — R41841 Cognitive communication deficit: Secondary | ICD-10-CM | POA: Diagnosis not present

## 2021-08-28 DIAGNOSIS — R1312 Dysphagia, oropharyngeal phase: Secondary | ICD-10-CM | POA: Diagnosis not present

## 2021-08-28 DIAGNOSIS — R488 Other symbolic dysfunctions: Secondary | ICD-10-CM | POA: Diagnosis not present

## 2021-08-29 DIAGNOSIS — I130 Hypertensive heart and chronic kidney disease with heart failure and stage 1 through stage 4 chronic kidney disease, or unspecified chronic kidney disease: Secondary | ICD-10-CM | POA: Diagnosis not present

## 2021-08-29 DIAGNOSIS — E1122 Type 2 diabetes mellitus with diabetic chronic kidney disease: Secondary | ICD-10-CM | POA: Diagnosis not present

## 2021-08-29 DIAGNOSIS — N183 Chronic kidney disease, stage 3 unspecified: Secondary | ICD-10-CM | POA: Diagnosis not present

## 2021-08-29 DIAGNOSIS — E785 Hyperlipidemia, unspecified: Secondary | ICD-10-CM | POA: Diagnosis not present

## 2021-08-29 DIAGNOSIS — I5030 Unspecified diastolic (congestive) heart failure: Secondary | ICD-10-CM | POA: Diagnosis not present

## 2021-08-29 DIAGNOSIS — D631 Anemia in chronic kidney disease: Secondary | ICD-10-CM | POA: Diagnosis not present

## 2021-09-02 DIAGNOSIS — E785 Hyperlipidemia, unspecified: Secondary | ICD-10-CM | POA: Diagnosis not present

## 2021-09-02 DIAGNOSIS — I130 Hypertensive heart and chronic kidney disease with heart failure and stage 1 through stage 4 chronic kidney disease, or unspecified chronic kidney disease: Secondary | ICD-10-CM | POA: Diagnosis not present

## 2021-09-02 DIAGNOSIS — N183 Chronic kidney disease, stage 3 unspecified: Secondary | ICD-10-CM | POA: Diagnosis not present

## 2021-09-02 DIAGNOSIS — E1122 Type 2 diabetes mellitus with diabetic chronic kidney disease: Secondary | ICD-10-CM | POA: Diagnosis not present

## 2021-09-02 DIAGNOSIS — I5030 Unspecified diastolic (congestive) heart failure: Secondary | ICD-10-CM | POA: Diagnosis not present

## 2021-09-02 DIAGNOSIS — D631 Anemia in chronic kidney disease: Secondary | ICD-10-CM | POA: Diagnosis not present

## 2021-09-03 DIAGNOSIS — I130 Hypertensive heart and chronic kidney disease with heart failure and stage 1 through stage 4 chronic kidney disease, or unspecified chronic kidney disease: Secondary | ICD-10-CM | POA: Diagnosis not present

## 2021-09-03 DIAGNOSIS — E1122 Type 2 diabetes mellitus with diabetic chronic kidney disease: Secondary | ICD-10-CM | POA: Diagnosis not present

## 2021-09-03 DIAGNOSIS — D631 Anemia in chronic kidney disease: Secondary | ICD-10-CM | POA: Diagnosis not present

## 2021-09-03 DIAGNOSIS — N183 Chronic kidney disease, stage 3 unspecified: Secondary | ICD-10-CM | POA: Diagnosis not present

## 2021-09-03 DIAGNOSIS — I5030 Unspecified diastolic (congestive) heart failure: Secondary | ICD-10-CM | POA: Diagnosis not present

## 2021-09-03 DIAGNOSIS — E785 Hyperlipidemia, unspecified: Secondary | ICD-10-CM | POA: Diagnosis not present

## 2021-09-10 DIAGNOSIS — I5030 Unspecified diastolic (congestive) heart failure: Secondary | ICD-10-CM | POA: Diagnosis not present

## 2021-09-10 DIAGNOSIS — R41841 Cognitive communication deficit: Secondary | ICD-10-CM | POA: Diagnosis not present

## 2021-09-10 DIAGNOSIS — R1312 Dysphagia, oropharyngeal phase: Secondary | ICD-10-CM | POA: Diagnosis not present

## 2021-09-10 DIAGNOSIS — E1122 Type 2 diabetes mellitus with diabetic chronic kidney disease: Secondary | ICD-10-CM | POA: Diagnosis not present

## 2021-09-10 DIAGNOSIS — D631 Anemia in chronic kidney disease: Secondary | ICD-10-CM | POA: Diagnosis not present

## 2021-09-10 DIAGNOSIS — R488 Other symbolic dysfunctions: Secondary | ICD-10-CM | POA: Diagnosis not present

## 2021-09-10 DIAGNOSIS — I130 Hypertensive heart and chronic kidney disease with heart failure and stage 1 through stage 4 chronic kidney disease, or unspecified chronic kidney disease: Secondary | ICD-10-CM | POA: Diagnosis not present

## 2021-09-10 DIAGNOSIS — E785 Hyperlipidemia, unspecified: Secondary | ICD-10-CM | POA: Diagnosis not present

## 2021-09-10 DIAGNOSIS — N183 Chronic kidney disease, stage 3 unspecified: Secondary | ICD-10-CM | POA: Diagnosis not present

## 2021-09-11 DIAGNOSIS — E118 Type 2 diabetes mellitus with unspecified complications: Secondary | ICD-10-CM | POA: Diagnosis not present

## 2021-09-11 DIAGNOSIS — B351 Tinea unguium: Secondary | ICD-10-CM | POA: Diagnosis not present

## 2021-09-12 DIAGNOSIS — F331 Major depressive disorder, recurrent, moderate: Secondary | ICD-10-CM | POA: Diagnosis not present

## 2021-09-12 DIAGNOSIS — G309 Alzheimer's disease, unspecified: Secondary | ICD-10-CM | POA: Diagnosis not present

## 2021-09-12 DIAGNOSIS — K219 Gastro-esophageal reflux disease without esophagitis: Secondary | ICD-10-CM | POA: Diagnosis not present

## 2021-09-12 DIAGNOSIS — R296 Repeated falls: Secondary | ICD-10-CM | POA: Diagnosis not present

## 2021-09-12 DIAGNOSIS — F0280 Dementia in other diseases classified elsewhere without behavioral disturbance: Secondary | ICD-10-CM | POA: Diagnosis not present

## 2021-09-12 DIAGNOSIS — I4891 Unspecified atrial fibrillation: Secondary | ICD-10-CM | POA: Diagnosis not present

## 2021-09-12 DIAGNOSIS — I130 Hypertensive heart and chronic kidney disease with heart failure and stage 1 through stage 4 chronic kidney disease, or unspecified chronic kidney disease: Secondary | ICD-10-CM | POA: Diagnosis not present

## 2021-09-12 DIAGNOSIS — N400 Enlarged prostate without lower urinary tract symptoms: Secondary | ICD-10-CM | POA: Diagnosis not present

## 2021-09-12 DIAGNOSIS — R41841 Cognitive communication deficit: Secondary | ICD-10-CM | POA: Diagnosis not present

## 2021-09-12 DIAGNOSIS — E785 Hyperlipidemia, unspecified: Secondary | ICD-10-CM | POA: Diagnosis not present

## 2021-09-12 DIAGNOSIS — M1990 Unspecified osteoarthritis, unspecified site: Secondary | ICD-10-CM | POA: Diagnosis not present

## 2021-09-12 DIAGNOSIS — N183 Chronic kidney disease, stage 3 unspecified: Secondary | ICD-10-CM | POA: Diagnosis not present

## 2021-09-12 DIAGNOSIS — H9190 Unspecified hearing loss, unspecified ear: Secondary | ICD-10-CM | POA: Diagnosis not present

## 2021-09-12 DIAGNOSIS — Z87442 Personal history of urinary calculi: Secondary | ICD-10-CM | POA: Diagnosis not present

## 2021-09-12 DIAGNOSIS — F988 Other specified behavioral and emotional disorders with onset usually occurring in childhood and adolescence: Secondary | ICD-10-CM | POA: Diagnosis not present

## 2021-09-12 DIAGNOSIS — F419 Anxiety disorder, unspecified: Secondary | ICD-10-CM | POA: Diagnosis not present

## 2021-09-12 DIAGNOSIS — I5030 Unspecified diastolic (congestive) heart failure: Secondary | ICD-10-CM | POA: Diagnosis not present

## 2021-09-12 DIAGNOSIS — D631 Anemia in chronic kidney disease: Secondary | ICD-10-CM | POA: Diagnosis not present

## 2021-09-12 DIAGNOSIS — I447 Left bundle-branch block, unspecified: Secondary | ICD-10-CM | POA: Diagnosis not present

## 2021-09-12 DIAGNOSIS — F09 Unspecified mental disorder due to known physiological condition: Secondary | ICD-10-CM | POA: Diagnosis not present

## 2021-09-12 DIAGNOSIS — R55 Syncope and collapse: Secondary | ICD-10-CM | POA: Diagnosis not present

## 2021-09-12 DIAGNOSIS — F32A Depression, unspecified: Secondary | ICD-10-CM | POA: Diagnosis not present

## 2021-09-12 DIAGNOSIS — E1122 Type 2 diabetes mellitus with diabetic chronic kidney disease: Secondary | ICD-10-CM | POA: Diagnosis not present

## 2021-09-12 DIAGNOSIS — I350 Nonrheumatic aortic (valve) stenosis: Secondary | ICD-10-CM | POA: Diagnosis not present

## 2021-09-12 DIAGNOSIS — R488 Other symbolic dysfunctions: Secondary | ICD-10-CM | POA: Diagnosis not present

## 2021-09-12 DIAGNOSIS — R1312 Dysphagia, oropharyngeal phase: Secondary | ICD-10-CM | POA: Diagnosis not present

## 2021-09-13 DIAGNOSIS — R41841 Cognitive communication deficit: Secondary | ICD-10-CM | POA: Diagnosis not present

## 2021-09-13 DIAGNOSIS — R1312 Dysphagia, oropharyngeal phase: Secondary | ICD-10-CM | POA: Diagnosis not present

## 2021-09-13 DIAGNOSIS — R488 Other symbolic dysfunctions: Secondary | ICD-10-CM | POA: Diagnosis not present

## 2021-09-17 DIAGNOSIS — I130 Hypertensive heart and chronic kidney disease with heart failure and stage 1 through stage 4 chronic kidney disease, or unspecified chronic kidney disease: Secondary | ICD-10-CM | POA: Diagnosis not present

## 2021-09-17 DIAGNOSIS — N183 Chronic kidney disease, stage 3 unspecified: Secondary | ICD-10-CM | POA: Diagnosis not present

## 2021-09-17 DIAGNOSIS — F02818 Dementia in other diseases classified elsewhere, unspecified severity, with other behavioral disturbance: Secondary | ICD-10-CM | POA: Diagnosis not present

## 2021-09-17 DIAGNOSIS — Z79899 Other long term (current) drug therapy: Secondary | ICD-10-CM | POA: Diagnosis not present

## 2021-09-17 DIAGNOSIS — F331 Major depressive disorder, recurrent, moderate: Secondary | ICD-10-CM | POA: Diagnosis not present

## 2021-09-17 DIAGNOSIS — D631 Anemia in chronic kidney disease: Secondary | ICD-10-CM | POA: Diagnosis not present

## 2021-09-17 DIAGNOSIS — E1122 Type 2 diabetes mellitus with diabetic chronic kidney disease: Secondary | ICD-10-CM | POA: Diagnosis not present

## 2021-09-17 DIAGNOSIS — K5901 Slow transit constipation: Secondary | ICD-10-CM | POA: Diagnosis not present

## 2021-09-17 DIAGNOSIS — I5030 Unspecified diastolic (congestive) heart failure: Secondary | ICD-10-CM | POA: Diagnosis not present

## 2021-09-17 DIAGNOSIS — G309 Alzheimer's disease, unspecified: Secondary | ICD-10-CM | POA: Diagnosis not present

## 2021-09-17 DIAGNOSIS — E785 Hyperlipidemia, unspecified: Secondary | ICD-10-CM | POA: Diagnosis not present

## 2021-09-19 DIAGNOSIS — F331 Major depressive disorder, recurrent, moderate: Secondary | ICD-10-CM | POA: Diagnosis not present

## 2021-09-20 DIAGNOSIS — R1312 Dysphagia, oropharyngeal phase: Secondary | ICD-10-CM | POA: Diagnosis not present

## 2021-09-20 DIAGNOSIS — R41841 Cognitive communication deficit: Secondary | ICD-10-CM | POA: Diagnosis not present

## 2021-09-20 DIAGNOSIS — R488 Other symbolic dysfunctions: Secondary | ICD-10-CM | POA: Diagnosis not present

## 2021-09-23 DIAGNOSIS — N183 Chronic kidney disease, stage 3 unspecified: Secondary | ICD-10-CM | POA: Diagnosis not present

## 2021-09-23 DIAGNOSIS — D631 Anemia in chronic kidney disease: Secondary | ICD-10-CM | POA: Diagnosis not present

## 2021-09-23 DIAGNOSIS — E1122 Type 2 diabetes mellitus with diabetic chronic kidney disease: Secondary | ICD-10-CM | POA: Diagnosis not present

## 2021-09-23 DIAGNOSIS — I5030 Unspecified diastolic (congestive) heart failure: Secondary | ICD-10-CM | POA: Diagnosis not present

## 2021-09-23 DIAGNOSIS — E785 Hyperlipidemia, unspecified: Secondary | ICD-10-CM | POA: Diagnosis not present

## 2021-09-23 DIAGNOSIS — I130 Hypertensive heart and chronic kidney disease with heart failure and stage 1 through stage 4 chronic kidney disease, or unspecified chronic kidney disease: Secondary | ICD-10-CM | POA: Diagnosis not present

## 2021-09-24 DIAGNOSIS — I130 Hypertensive heart and chronic kidney disease with heart failure and stage 1 through stage 4 chronic kidney disease, or unspecified chronic kidney disease: Secondary | ICD-10-CM | POA: Diagnosis not present

## 2021-09-24 DIAGNOSIS — N183 Chronic kidney disease, stage 3 unspecified: Secondary | ICD-10-CM | POA: Diagnosis not present

## 2021-09-24 DIAGNOSIS — E785 Hyperlipidemia, unspecified: Secondary | ICD-10-CM | POA: Diagnosis not present

## 2021-09-24 DIAGNOSIS — R41841 Cognitive communication deficit: Secondary | ICD-10-CM | POA: Diagnosis not present

## 2021-09-24 DIAGNOSIS — R1312 Dysphagia, oropharyngeal phase: Secondary | ICD-10-CM | POA: Diagnosis not present

## 2021-09-24 DIAGNOSIS — E1122 Type 2 diabetes mellitus with diabetic chronic kidney disease: Secondary | ICD-10-CM | POA: Diagnosis not present

## 2021-09-24 DIAGNOSIS — D631 Anemia in chronic kidney disease: Secondary | ICD-10-CM | POA: Diagnosis not present

## 2021-09-24 DIAGNOSIS — I5030 Unspecified diastolic (congestive) heart failure: Secondary | ICD-10-CM | POA: Diagnosis not present

## 2021-09-24 DIAGNOSIS — R488 Other symbolic dysfunctions: Secondary | ICD-10-CM | POA: Diagnosis not present

## 2021-09-26 DIAGNOSIS — D631 Anemia in chronic kidney disease: Secondary | ICD-10-CM | POA: Diagnosis not present

## 2021-09-26 DIAGNOSIS — E785 Hyperlipidemia, unspecified: Secondary | ICD-10-CM | POA: Diagnosis not present

## 2021-09-26 DIAGNOSIS — I130 Hypertensive heart and chronic kidney disease with heart failure and stage 1 through stage 4 chronic kidney disease, or unspecified chronic kidney disease: Secondary | ICD-10-CM | POA: Diagnosis not present

## 2021-09-26 DIAGNOSIS — N183 Chronic kidney disease, stage 3 unspecified: Secondary | ICD-10-CM | POA: Diagnosis not present

## 2021-09-26 DIAGNOSIS — I5030 Unspecified diastolic (congestive) heart failure: Secondary | ICD-10-CM | POA: Diagnosis not present

## 2021-09-26 DIAGNOSIS — E1122 Type 2 diabetes mellitus with diabetic chronic kidney disease: Secondary | ICD-10-CM | POA: Diagnosis not present

## 2021-10-01 DIAGNOSIS — R1312 Dysphagia, oropharyngeal phase: Secondary | ICD-10-CM | POA: Diagnosis not present

## 2021-10-01 DIAGNOSIS — D631 Anemia in chronic kidney disease: Secondary | ICD-10-CM | POA: Diagnosis not present

## 2021-10-01 DIAGNOSIS — N3281 Overactive bladder: Secondary | ICD-10-CM | POA: Diagnosis not present

## 2021-10-01 DIAGNOSIS — E785 Hyperlipidemia, unspecified: Secondary | ICD-10-CM | POA: Diagnosis not present

## 2021-10-01 DIAGNOSIS — F02818 Dementia in other diseases classified elsewhere, unspecified severity, with other behavioral disturbance: Secondary | ICD-10-CM | POA: Diagnosis not present

## 2021-10-01 DIAGNOSIS — R41841 Cognitive communication deficit: Secondary | ICD-10-CM | POA: Diagnosis not present

## 2021-10-01 DIAGNOSIS — N183 Chronic kidney disease, stage 3 unspecified: Secondary | ICD-10-CM | POA: Diagnosis not present

## 2021-10-01 DIAGNOSIS — R488 Other symbolic dysfunctions: Secondary | ICD-10-CM | POA: Diagnosis not present

## 2021-10-01 DIAGNOSIS — E1122 Type 2 diabetes mellitus with diabetic chronic kidney disease: Secondary | ICD-10-CM | POA: Diagnosis not present

## 2021-10-01 DIAGNOSIS — I5030 Unspecified diastolic (congestive) heart failure: Secondary | ICD-10-CM | POA: Diagnosis not present

## 2021-10-01 DIAGNOSIS — I130 Hypertensive heart and chronic kidney disease with heart failure and stage 1 through stage 4 chronic kidney disease, or unspecified chronic kidney disease: Secondary | ICD-10-CM | POA: Diagnosis not present

## 2021-10-01 DIAGNOSIS — G309 Alzheimer's disease, unspecified: Secondary | ICD-10-CM | POA: Diagnosis not present

## 2021-10-01 DIAGNOSIS — R197 Diarrhea, unspecified: Secondary | ICD-10-CM | POA: Diagnosis not present

## 2021-10-02 DIAGNOSIS — R488 Other symbolic dysfunctions: Secondary | ICD-10-CM | POA: Diagnosis not present

## 2021-10-02 DIAGNOSIS — R1312 Dysphagia, oropharyngeal phase: Secondary | ICD-10-CM | POA: Diagnosis not present

## 2021-10-02 DIAGNOSIS — R41841 Cognitive communication deficit: Secondary | ICD-10-CM | POA: Diagnosis not present

## 2021-10-03 DIAGNOSIS — Z952 Presence of prosthetic heart valve: Secondary | ICD-10-CM | POA: Diagnosis not present

## 2021-10-03 DIAGNOSIS — I10 Essential (primary) hypertension: Secondary | ICD-10-CM | POA: Diagnosis not present

## 2021-10-03 DIAGNOSIS — E785 Hyperlipidemia, unspecified: Secondary | ICD-10-CM | POA: Diagnosis not present

## 2021-10-03 DIAGNOSIS — N401 Enlarged prostate with lower urinary tract symptoms: Secondary | ICD-10-CM | POA: Diagnosis not present

## 2021-10-03 DIAGNOSIS — R296 Repeated falls: Secondary | ICD-10-CM | POA: Diagnosis not present

## 2021-10-03 DIAGNOSIS — I130 Hypertensive heart and chronic kidney disease with heart failure and stage 1 through stage 4 chronic kidney disease, or unspecified chronic kidney disease: Secondary | ICD-10-CM | POA: Diagnosis not present

## 2021-10-03 DIAGNOSIS — R291 Meningismus: Secondary | ICD-10-CM | POA: Diagnosis not present

## 2021-10-03 DIAGNOSIS — F988 Other specified behavioral and emotional disorders with onset usually occurring in childhood and adolescence: Secondary | ICD-10-CM | POA: Diagnosis not present

## 2021-10-03 DIAGNOSIS — E1165 Type 2 diabetes mellitus with hyperglycemia: Secondary | ICD-10-CM | POA: Diagnosis not present

## 2021-10-03 DIAGNOSIS — D631 Anemia in chronic kidney disease: Secondary | ICD-10-CM | POA: Diagnosis not present

## 2021-10-03 DIAGNOSIS — N183 Chronic kidney disease, stage 3 unspecified: Secondary | ICD-10-CM | POA: Diagnosis not present

## 2021-10-03 DIAGNOSIS — F339 Major depressive disorder, recurrent, unspecified: Secondary | ICD-10-CM | POA: Diagnosis not present

## 2021-10-03 DIAGNOSIS — E1122 Type 2 diabetes mellitus with diabetic chronic kidney disease: Secondary | ICD-10-CM | POA: Diagnosis not present

## 2021-10-03 DIAGNOSIS — I5030 Unspecified diastolic (congestive) heart failure: Secondary | ICD-10-CM | POA: Diagnosis not present

## 2021-10-03 DIAGNOSIS — D649 Anemia, unspecified: Secondary | ICD-10-CM | POA: Diagnosis not present

## 2021-10-08 DIAGNOSIS — R41841 Cognitive communication deficit: Secondary | ICD-10-CM | POA: Diagnosis not present

## 2021-10-08 DIAGNOSIS — I130 Hypertensive heart and chronic kidney disease with heart failure and stage 1 through stage 4 chronic kidney disease, or unspecified chronic kidney disease: Secondary | ICD-10-CM | POA: Diagnosis not present

## 2021-10-08 DIAGNOSIS — R1312 Dysphagia, oropharyngeal phase: Secondary | ICD-10-CM | POA: Diagnosis not present

## 2021-10-08 DIAGNOSIS — I5030 Unspecified diastolic (congestive) heart failure: Secondary | ICD-10-CM | POA: Diagnosis not present

## 2021-10-08 DIAGNOSIS — R488 Other symbolic dysfunctions: Secondary | ICD-10-CM | POA: Diagnosis not present

## 2021-10-08 DIAGNOSIS — D631 Anemia in chronic kidney disease: Secondary | ICD-10-CM | POA: Diagnosis not present

## 2021-10-08 DIAGNOSIS — N183 Chronic kidney disease, stage 3 unspecified: Secondary | ICD-10-CM | POA: Diagnosis not present

## 2021-10-08 DIAGNOSIS — E785 Hyperlipidemia, unspecified: Secondary | ICD-10-CM | POA: Diagnosis not present

## 2021-10-08 DIAGNOSIS — E1122 Type 2 diabetes mellitus with diabetic chronic kidney disease: Secondary | ICD-10-CM | POA: Diagnosis not present

## 2021-10-09 DIAGNOSIS — R1312 Dysphagia, oropharyngeal phase: Secondary | ICD-10-CM | POA: Diagnosis not present

## 2021-10-09 DIAGNOSIS — I130 Hypertensive heart and chronic kidney disease with heart failure and stage 1 through stage 4 chronic kidney disease, or unspecified chronic kidney disease: Secondary | ICD-10-CM | POA: Diagnosis not present

## 2021-10-09 DIAGNOSIS — R41841 Cognitive communication deficit: Secondary | ICD-10-CM | POA: Diagnosis not present

## 2021-10-09 DIAGNOSIS — E785 Hyperlipidemia, unspecified: Secondary | ICD-10-CM | POA: Diagnosis not present

## 2021-10-09 DIAGNOSIS — N183 Chronic kidney disease, stage 3 unspecified: Secondary | ICD-10-CM | POA: Diagnosis not present

## 2021-10-09 DIAGNOSIS — D631 Anemia in chronic kidney disease: Secondary | ICD-10-CM | POA: Diagnosis not present

## 2021-10-09 DIAGNOSIS — I5030 Unspecified diastolic (congestive) heart failure: Secondary | ICD-10-CM | POA: Diagnosis not present

## 2021-10-09 DIAGNOSIS — R488 Other symbolic dysfunctions: Secondary | ICD-10-CM | POA: Diagnosis not present

## 2021-10-09 DIAGNOSIS — E1122 Type 2 diabetes mellitus with diabetic chronic kidney disease: Secondary | ICD-10-CM | POA: Diagnosis not present

## 2021-10-10 DIAGNOSIS — R41841 Cognitive communication deficit: Secondary | ICD-10-CM | POA: Diagnosis not present

## 2021-10-10 DIAGNOSIS — R488 Other symbolic dysfunctions: Secondary | ICD-10-CM | POA: Diagnosis not present

## 2021-10-10 DIAGNOSIS — R1312 Dysphagia, oropharyngeal phase: Secondary | ICD-10-CM | POA: Diagnosis not present

## 2021-10-11 DIAGNOSIS — E785 Hyperlipidemia, unspecified: Secondary | ICD-10-CM | POA: Diagnosis not present

## 2021-10-11 DIAGNOSIS — I5030 Unspecified diastolic (congestive) heart failure: Secondary | ICD-10-CM | POA: Diagnosis not present

## 2021-10-11 DIAGNOSIS — N183 Chronic kidney disease, stage 3 unspecified: Secondary | ICD-10-CM | POA: Diagnosis not present

## 2021-10-11 DIAGNOSIS — E1122 Type 2 diabetes mellitus with diabetic chronic kidney disease: Secondary | ICD-10-CM | POA: Diagnosis not present

## 2021-10-11 DIAGNOSIS — D631 Anemia in chronic kidney disease: Secondary | ICD-10-CM | POA: Diagnosis not present

## 2021-10-11 DIAGNOSIS — I130 Hypertensive heart and chronic kidney disease with heart failure and stage 1 through stage 4 chronic kidney disease, or unspecified chronic kidney disease: Secondary | ICD-10-CM | POA: Diagnosis not present

## 2021-10-13 DIAGNOSIS — F0280 Dementia in other diseases classified elsewhere without behavioral disturbance: Secondary | ICD-10-CM | POA: Diagnosis not present

## 2021-10-13 DIAGNOSIS — R296 Repeated falls: Secondary | ICD-10-CM | POA: Diagnosis not present

## 2021-10-13 DIAGNOSIS — F988 Other specified behavioral and emotional disorders with onset usually occurring in childhood and adolescence: Secondary | ICD-10-CM | POA: Diagnosis not present

## 2021-10-13 DIAGNOSIS — I4891 Unspecified atrial fibrillation: Secondary | ICD-10-CM | POA: Diagnosis not present

## 2021-10-13 DIAGNOSIS — I5030 Unspecified diastolic (congestive) heart failure: Secondary | ICD-10-CM | POA: Diagnosis not present

## 2021-10-13 DIAGNOSIS — N400 Enlarged prostate without lower urinary tract symptoms: Secondary | ICD-10-CM | POA: Diagnosis not present

## 2021-10-13 DIAGNOSIS — N183 Chronic kidney disease, stage 3 unspecified: Secondary | ICD-10-CM | POA: Diagnosis not present

## 2021-10-13 DIAGNOSIS — M1990 Unspecified osteoarthritis, unspecified site: Secondary | ICD-10-CM | POA: Diagnosis not present

## 2021-10-13 DIAGNOSIS — H9190 Unspecified hearing loss, unspecified ear: Secondary | ICD-10-CM | POA: Diagnosis not present

## 2021-10-13 DIAGNOSIS — I350 Nonrheumatic aortic (valve) stenosis: Secondary | ICD-10-CM | POA: Diagnosis not present

## 2021-10-13 DIAGNOSIS — K219 Gastro-esophageal reflux disease without esophagitis: Secondary | ICD-10-CM | POA: Diagnosis not present

## 2021-10-13 DIAGNOSIS — D631 Anemia in chronic kidney disease: Secondary | ICD-10-CM | POA: Diagnosis not present

## 2021-10-13 DIAGNOSIS — F32A Depression, unspecified: Secondary | ICD-10-CM | POA: Diagnosis not present

## 2021-10-13 DIAGNOSIS — F419 Anxiety disorder, unspecified: Secondary | ICD-10-CM | POA: Diagnosis not present

## 2021-10-13 DIAGNOSIS — E1122 Type 2 diabetes mellitus with diabetic chronic kidney disease: Secondary | ICD-10-CM | POA: Diagnosis not present

## 2021-10-13 DIAGNOSIS — Z87442 Personal history of urinary calculi: Secondary | ICD-10-CM | POA: Diagnosis not present

## 2021-10-13 DIAGNOSIS — I130 Hypertensive heart and chronic kidney disease with heart failure and stage 1 through stage 4 chronic kidney disease, or unspecified chronic kidney disease: Secondary | ICD-10-CM | POA: Diagnosis not present

## 2021-10-13 DIAGNOSIS — I447 Left bundle-branch block, unspecified: Secondary | ICD-10-CM | POA: Diagnosis not present

## 2021-10-13 DIAGNOSIS — G309 Alzheimer's disease, unspecified: Secondary | ICD-10-CM | POA: Diagnosis not present

## 2021-10-13 DIAGNOSIS — R55 Syncope and collapse: Secondary | ICD-10-CM | POA: Diagnosis not present

## 2021-10-13 DIAGNOSIS — E785 Hyperlipidemia, unspecified: Secondary | ICD-10-CM | POA: Diagnosis not present

## 2021-10-14 DIAGNOSIS — N183 Chronic kidney disease, stage 3 unspecified: Secondary | ICD-10-CM | POA: Diagnosis not present

## 2021-10-14 DIAGNOSIS — I5030 Unspecified diastolic (congestive) heart failure: Secondary | ICD-10-CM | POA: Diagnosis not present

## 2021-10-14 DIAGNOSIS — E1122 Type 2 diabetes mellitus with diabetic chronic kidney disease: Secondary | ICD-10-CM | POA: Diagnosis not present

## 2021-10-14 DIAGNOSIS — I130 Hypertensive heart and chronic kidney disease with heart failure and stage 1 through stage 4 chronic kidney disease, or unspecified chronic kidney disease: Secondary | ICD-10-CM | POA: Diagnosis not present

## 2021-10-14 DIAGNOSIS — D631 Anemia in chronic kidney disease: Secondary | ICD-10-CM | POA: Diagnosis not present

## 2021-10-14 DIAGNOSIS — E785 Hyperlipidemia, unspecified: Secondary | ICD-10-CM | POA: Diagnosis not present

## 2021-10-15 DIAGNOSIS — I5030 Unspecified diastolic (congestive) heart failure: Secondary | ICD-10-CM | POA: Diagnosis not present

## 2021-10-15 DIAGNOSIS — D631 Anemia in chronic kidney disease: Secondary | ICD-10-CM | POA: Diagnosis not present

## 2021-10-15 DIAGNOSIS — E785 Hyperlipidemia, unspecified: Secondary | ICD-10-CM | POA: Diagnosis not present

## 2021-10-15 DIAGNOSIS — E1122 Type 2 diabetes mellitus with diabetic chronic kidney disease: Secondary | ICD-10-CM | POA: Diagnosis not present

## 2021-10-15 DIAGNOSIS — I130 Hypertensive heart and chronic kidney disease with heart failure and stage 1 through stage 4 chronic kidney disease, or unspecified chronic kidney disease: Secondary | ICD-10-CM | POA: Diagnosis not present

## 2021-10-15 DIAGNOSIS — N183 Chronic kidney disease, stage 3 unspecified: Secondary | ICD-10-CM | POA: Diagnosis not present

## 2021-10-17 DIAGNOSIS — F0390 Unspecified dementia without behavioral disturbance: Secondary | ICD-10-CM | POA: Diagnosis not present

## 2021-10-17 DIAGNOSIS — F331 Major depressive disorder, recurrent, moderate: Secondary | ICD-10-CM | POA: Diagnosis not present

## 2021-10-19 DIAGNOSIS — R488 Other symbolic dysfunctions: Secondary | ICD-10-CM | POA: Diagnosis not present

## 2021-10-19 DIAGNOSIS — R41841 Cognitive communication deficit: Secondary | ICD-10-CM | POA: Diagnosis not present

## 2021-10-19 DIAGNOSIS — R1312 Dysphagia, oropharyngeal phase: Secondary | ICD-10-CM | POA: Diagnosis not present

## 2021-10-21 DIAGNOSIS — E785 Hyperlipidemia, unspecified: Secondary | ICD-10-CM | POA: Diagnosis not present

## 2021-10-21 DIAGNOSIS — D631 Anemia in chronic kidney disease: Secondary | ICD-10-CM | POA: Diagnosis not present

## 2021-10-21 DIAGNOSIS — I130 Hypertensive heart and chronic kidney disease with heart failure and stage 1 through stage 4 chronic kidney disease, or unspecified chronic kidney disease: Secondary | ICD-10-CM | POA: Diagnosis not present

## 2021-10-21 DIAGNOSIS — I5030 Unspecified diastolic (congestive) heart failure: Secondary | ICD-10-CM | POA: Diagnosis not present

## 2021-10-21 DIAGNOSIS — N183 Chronic kidney disease, stage 3 unspecified: Secondary | ICD-10-CM | POA: Diagnosis not present

## 2021-10-21 DIAGNOSIS — E1122 Type 2 diabetes mellitus with diabetic chronic kidney disease: Secondary | ICD-10-CM | POA: Diagnosis not present

## 2021-10-22 DIAGNOSIS — I130 Hypertensive heart and chronic kidney disease with heart failure and stage 1 through stage 4 chronic kidney disease, or unspecified chronic kidney disease: Secondary | ICD-10-CM | POA: Diagnosis not present

## 2021-10-22 DIAGNOSIS — F331 Major depressive disorder, recurrent, moderate: Secondary | ICD-10-CM | POA: Diagnosis not present

## 2021-10-22 DIAGNOSIS — F02B0 Dementia in other diseases classified elsewhere, moderate, without behavioral disturbance, psychotic disturbance, mood disturbance, and anxiety: Secondary | ICD-10-CM | POA: Diagnosis not present

## 2021-10-22 DIAGNOSIS — N3281 Overactive bladder: Secondary | ICD-10-CM | POA: Diagnosis not present

## 2021-10-22 DIAGNOSIS — G301 Alzheimer's disease with late onset: Secondary | ICD-10-CM | POA: Diagnosis not present

## 2021-10-22 DIAGNOSIS — Z79899 Other long term (current) drug therapy: Secondary | ICD-10-CM | POA: Diagnosis not present

## 2021-10-22 DIAGNOSIS — E1122 Type 2 diabetes mellitus with diabetic chronic kidney disease: Secondary | ICD-10-CM | POA: Diagnosis not present

## 2021-10-22 DIAGNOSIS — E785 Hyperlipidemia, unspecified: Secondary | ICD-10-CM | POA: Diagnosis not present

## 2021-10-22 DIAGNOSIS — D631 Anemia in chronic kidney disease: Secondary | ICD-10-CM | POA: Diagnosis not present

## 2021-10-22 DIAGNOSIS — N183 Chronic kidney disease, stage 3 unspecified: Secondary | ICD-10-CM | POA: Diagnosis not present

## 2021-10-22 DIAGNOSIS — I5030 Unspecified diastolic (congestive) heart failure: Secondary | ICD-10-CM | POA: Diagnosis not present

## 2021-10-25 DIAGNOSIS — I130 Hypertensive heart and chronic kidney disease with heart failure and stage 1 through stage 4 chronic kidney disease, or unspecified chronic kidney disease: Secondary | ICD-10-CM | POA: Diagnosis not present

## 2021-10-25 DIAGNOSIS — D631 Anemia in chronic kidney disease: Secondary | ICD-10-CM | POA: Diagnosis not present

## 2021-10-25 DIAGNOSIS — I5030 Unspecified diastolic (congestive) heart failure: Secondary | ICD-10-CM | POA: Diagnosis not present

## 2021-10-25 DIAGNOSIS — N183 Chronic kidney disease, stage 3 unspecified: Secondary | ICD-10-CM | POA: Diagnosis not present

## 2021-10-25 DIAGNOSIS — E1122 Type 2 diabetes mellitus with diabetic chronic kidney disease: Secondary | ICD-10-CM | POA: Diagnosis not present

## 2021-10-25 DIAGNOSIS — E785 Hyperlipidemia, unspecified: Secondary | ICD-10-CM | POA: Diagnosis not present

## 2021-10-28 DIAGNOSIS — D631 Anemia in chronic kidney disease: Secondary | ICD-10-CM | POA: Diagnosis not present

## 2021-10-28 DIAGNOSIS — N183 Chronic kidney disease, stage 3 unspecified: Secondary | ICD-10-CM | POA: Diagnosis not present

## 2021-10-28 DIAGNOSIS — E785 Hyperlipidemia, unspecified: Secondary | ICD-10-CM | POA: Diagnosis not present

## 2021-10-28 DIAGNOSIS — I5030 Unspecified diastolic (congestive) heart failure: Secondary | ICD-10-CM | POA: Diagnosis not present

## 2021-10-28 DIAGNOSIS — E1122 Type 2 diabetes mellitus with diabetic chronic kidney disease: Secondary | ICD-10-CM | POA: Diagnosis not present

## 2021-10-28 DIAGNOSIS — I130 Hypertensive heart and chronic kidney disease with heart failure and stage 1 through stage 4 chronic kidney disease, or unspecified chronic kidney disease: Secondary | ICD-10-CM | POA: Diagnosis not present

## 2021-10-28 DIAGNOSIS — F331 Major depressive disorder, recurrent, moderate: Secondary | ICD-10-CM | POA: Diagnosis not present

## 2021-10-29 DIAGNOSIS — R300 Dysuria: Secondary | ICD-10-CM | POA: Diagnosis not present

## 2021-10-29 DIAGNOSIS — E785 Hyperlipidemia, unspecified: Secondary | ICD-10-CM | POA: Diagnosis not present

## 2021-10-29 DIAGNOSIS — I5030 Unspecified diastolic (congestive) heart failure: Secondary | ICD-10-CM | POA: Diagnosis not present

## 2021-10-29 DIAGNOSIS — N183 Chronic kidney disease, stage 3 unspecified: Secondary | ICD-10-CM | POA: Diagnosis not present

## 2021-10-29 DIAGNOSIS — D631 Anemia in chronic kidney disease: Secondary | ICD-10-CM | POA: Diagnosis not present

## 2021-10-29 DIAGNOSIS — U071 COVID-19: Secondary | ICD-10-CM | POA: Diagnosis not present

## 2021-10-29 DIAGNOSIS — E1122 Type 2 diabetes mellitus with diabetic chronic kidney disease: Secondary | ICD-10-CM | POA: Diagnosis not present

## 2021-10-29 DIAGNOSIS — I130 Hypertensive heart and chronic kidney disease with heart failure and stage 1 through stage 4 chronic kidney disease, or unspecified chronic kidney disease: Secondary | ICD-10-CM | POA: Diagnosis not present

## 2021-10-30 DIAGNOSIS — I5030 Unspecified diastolic (congestive) heart failure: Secondary | ICD-10-CM | POA: Diagnosis not present

## 2021-10-30 DIAGNOSIS — E785 Hyperlipidemia, unspecified: Secondary | ICD-10-CM | POA: Diagnosis not present

## 2021-10-30 DIAGNOSIS — E1122 Type 2 diabetes mellitus with diabetic chronic kidney disease: Secondary | ICD-10-CM | POA: Diagnosis not present

## 2021-10-30 DIAGNOSIS — D631 Anemia in chronic kidney disease: Secondary | ICD-10-CM | POA: Diagnosis not present

## 2021-10-30 DIAGNOSIS — N183 Chronic kidney disease, stage 3 unspecified: Secondary | ICD-10-CM | POA: Diagnosis not present

## 2021-10-30 DIAGNOSIS — I130 Hypertensive heart and chronic kidney disease with heart failure and stage 1 through stage 4 chronic kidney disease, or unspecified chronic kidney disease: Secondary | ICD-10-CM | POA: Diagnosis not present

## 2021-10-31 DIAGNOSIS — I4891 Unspecified atrial fibrillation: Secondary | ICD-10-CM | POA: Diagnosis not present

## 2021-10-31 DIAGNOSIS — J1282 Pneumonia due to coronavirus disease 2019: Secondary | ICD-10-CM | POA: Diagnosis not present

## 2021-10-31 DIAGNOSIS — F330 Major depressive disorder, recurrent, mild: Secondary | ICD-10-CM | POA: Diagnosis not present

## 2021-10-31 DIAGNOSIS — K219 Gastro-esophageal reflux disease without esophagitis: Secondary | ICD-10-CM | POA: Diagnosis not present

## 2021-10-31 DIAGNOSIS — I350 Nonrheumatic aortic (valve) stenosis: Secondary | ICD-10-CM | POA: Diagnosis not present

## 2021-10-31 DIAGNOSIS — N400 Enlarged prostate without lower urinary tract symptoms: Secondary | ICD-10-CM | POA: Diagnosis not present

## 2021-10-31 DIAGNOSIS — I447 Left bundle-branch block, unspecified: Secondary | ICD-10-CM | POA: Diagnosis not present

## 2021-10-31 DIAGNOSIS — Z515 Encounter for palliative care: Secondary | ICD-10-CM | POA: Diagnosis not present

## 2021-10-31 DIAGNOSIS — F419 Anxiety disorder, unspecified: Secondary | ICD-10-CM | POA: Diagnosis not present

## 2021-10-31 DIAGNOSIS — E119 Type 2 diabetes mellitus without complications: Secondary | ICD-10-CM | POA: Diagnosis not present

## 2021-10-31 DIAGNOSIS — E785 Hyperlipidemia, unspecified: Secondary | ICD-10-CM | POA: Diagnosis not present

## 2021-10-31 DIAGNOSIS — I130 Hypertensive heart and chronic kidney disease with heart failure and stage 1 through stage 4 chronic kidney disease, or unspecified chronic kidney disease: Secondary | ICD-10-CM | POA: Diagnosis not present

## 2021-10-31 DIAGNOSIS — I5022 Chronic systolic (congestive) heart failure: Secondary | ICD-10-CM | POA: Diagnosis not present

## 2021-11-01 DIAGNOSIS — F419 Anxiety disorder, unspecified: Secondary | ICD-10-CM | POA: Diagnosis not present

## 2021-11-01 DIAGNOSIS — F330 Major depressive disorder, recurrent, mild: Secondary | ICD-10-CM | POA: Diagnosis not present

## 2021-11-01 DIAGNOSIS — I130 Hypertensive heart and chronic kidney disease with heart failure and stage 1 through stage 4 chronic kidney disease, or unspecified chronic kidney disease: Secondary | ICD-10-CM | POA: Diagnosis not present

## 2021-11-01 DIAGNOSIS — E785 Hyperlipidemia, unspecified: Secondary | ICD-10-CM | POA: Diagnosis not present

## 2021-11-01 DIAGNOSIS — I5022 Chronic systolic (congestive) heart failure: Secondary | ICD-10-CM | POA: Diagnosis not present

## 2021-11-01 DIAGNOSIS — E119 Type 2 diabetes mellitus without complications: Secondary | ICD-10-CM | POA: Diagnosis not present

## 2021-11-05 DIAGNOSIS — F330 Major depressive disorder, recurrent, mild: Secondary | ICD-10-CM | POA: Diagnosis not present

## 2021-11-05 DIAGNOSIS — I130 Hypertensive heart and chronic kidney disease with heart failure and stage 1 through stage 4 chronic kidney disease, or unspecified chronic kidney disease: Secondary | ICD-10-CM | POA: Diagnosis not present

## 2021-11-05 DIAGNOSIS — E119 Type 2 diabetes mellitus without complications: Secondary | ICD-10-CM | POA: Diagnosis not present

## 2021-11-05 DIAGNOSIS — I5022 Chronic systolic (congestive) heart failure: Secondary | ICD-10-CM | POA: Diagnosis not present

## 2021-11-05 DIAGNOSIS — E785 Hyperlipidemia, unspecified: Secondary | ICD-10-CM | POA: Diagnosis not present

## 2021-11-05 DIAGNOSIS — F419 Anxiety disorder, unspecified: Secondary | ICD-10-CM | POA: Diagnosis not present

## 2021-11-06 DIAGNOSIS — F330 Major depressive disorder, recurrent, mild: Secondary | ICD-10-CM | POA: Diagnosis not present

## 2021-11-06 DIAGNOSIS — I130 Hypertensive heart and chronic kidney disease with heart failure and stage 1 through stage 4 chronic kidney disease, or unspecified chronic kidney disease: Secondary | ICD-10-CM | POA: Diagnosis not present

## 2021-11-06 DIAGNOSIS — I5022 Chronic systolic (congestive) heart failure: Secondary | ICD-10-CM | POA: Diagnosis not present

## 2021-11-06 DIAGNOSIS — E119 Type 2 diabetes mellitus without complications: Secondary | ICD-10-CM | POA: Diagnosis not present

## 2021-11-06 DIAGNOSIS — E785 Hyperlipidemia, unspecified: Secondary | ICD-10-CM | POA: Diagnosis not present

## 2021-11-06 DIAGNOSIS — F419 Anxiety disorder, unspecified: Secondary | ICD-10-CM | POA: Diagnosis not present

## 2021-11-11 DIAGNOSIS — R54 Age-related physical debility: Secondary | ICD-10-CM | POA: Diagnosis not present

## 2021-11-11 DIAGNOSIS — D508 Other iron deficiency anemias: Secondary | ICD-10-CM | POA: Diagnosis not present

## 2021-11-11 DIAGNOSIS — E109 Type 1 diabetes mellitus without complications: Secondary | ICD-10-CM | POA: Diagnosis not present

## 2021-11-11 DIAGNOSIS — E782 Mixed hyperlipidemia: Secondary | ICD-10-CM | POA: Diagnosis not present

## 2021-11-12 DIAGNOSIS — I5022 Chronic systolic (congestive) heart failure: Secondary | ICD-10-CM | POA: Diagnosis not present

## 2021-11-12 DIAGNOSIS — E785 Hyperlipidemia, unspecified: Secondary | ICD-10-CM | POA: Diagnosis not present

## 2021-11-12 DIAGNOSIS — I130 Hypertensive heart and chronic kidney disease with heart failure and stage 1 through stage 4 chronic kidney disease, or unspecified chronic kidney disease: Secondary | ICD-10-CM | POA: Diagnosis not present

## 2021-11-12 DIAGNOSIS — F330 Major depressive disorder, recurrent, mild: Secondary | ICD-10-CM | POA: Diagnosis not present

## 2021-11-12 DIAGNOSIS — E119 Type 2 diabetes mellitus without complications: Secondary | ICD-10-CM | POA: Diagnosis not present

## 2021-11-12 DIAGNOSIS — F419 Anxiety disorder, unspecified: Secondary | ICD-10-CM | POA: Diagnosis not present

## 2021-11-13 DIAGNOSIS — J1282 Pneumonia due to coronavirus disease 2019: Secondary | ICD-10-CM | POA: Diagnosis not present

## 2021-11-13 DIAGNOSIS — Z09 Encounter for follow-up examination after completed treatment for conditions other than malignant neoplasm: Secondary | ICD-10-CM | POA: Diagnosis not present

## 2021-11-13 DIAGNOSIS — I10 Essential (primary) hypertension: Secondary | ICD-10-CM | POA: Diagnosis not present

## 2021-11-13 DIAGNOSIS — E119 Type 2 diabetes mellitus without complications: Secondary | ICD-10-CM | POA: Diagnosis not present

## 2021-11-13 DIAGNOSIS — I350 Nonrheumatic aortic (valve) stenosis: Secondary | ICD-10-CM | POA: Diagnosis not present

## 2021-11-13 DIAGNOSIS — N400 Enlarged prostate without lower urinary tract symptoms: Secondary | ICD-10-CM | POA: Diagnosis not present

## 2021-11-13 DIAGNOSIS — F419 Anxiety disorder, unspecified: Secondary | ICD-10-CM | POA: Diagnosis not present

## 2021-11-13 DIAGNOSIS — R291 Meningismus: Secondary | ICD-10-CM | POA: Diagnosis not present

## 2021-11-13 DIAGNOSIS — Z952 Presence of prosthetic heart valve: Secondary | ICD-10-CM | POA: Diagnosis not present

## 2021-11-13 DIAGNOSIS — K219 Gastro-esophageal reflux disease without esophagitis: Secondary | ICD-10-CM | POA: Diagnosis not present

## 2021-11-13 DIAGNOSIS — N401 Enlarged prostate with lower urinary tract symptoms: Secondary | ICD-10-CM | POA: Diagnosis not present

## 2021-11-13 DIAGNOSIS — I4891 Unspecified atrial fibrillation: Secondary | ICD-10-CM | POA: Diagnosis not present

## 2021-11-13 DIAGNOSIS — E785 Hyperlipidemia, unspecified: Secondary | ICD-10-CM | POA: Diagnosis not present

## 2021-11-13 DIAGNOSIS — E1165 Type 2 diabetes mellitus with hyperglycemia: Secondary | ICD-10-CM | POA: Diagnosis not present

## 2021-11-13 DIAGNOSIS — I130 Hypertensive heart and chronic kidney disease with heart failure and stage 1 through stage 4 chronic kidney disease, or unspecified chronic kidney disease: Secondary | ICD-10-CM | POA: Diagnosis not present

## 2021-11-13 DIAGNOSIS — U071 COVID-19: Secondary | ICD-10-CM | POA: Diagnosis not present

## 2021-11-13 DIAGNOSIS — F339 Major depressive disorder, recurrent, unspecified: Secondary | ICD-10-CM | POA: Diagnosis not present

## 2021-11-13 DIAGNOSIS — F988 Other specified behavioral and emotional disorders with onset usually occurring in childhood and adolescence: Secondary | ICD-10-CM | POA: Diagnosis not present

## 2021-11-13 DIAGNOSIS — Z515 Encounter for palliative care: Secondary | ICD-10-CM | POA: Diagnosis not present

## 2021-11-13 DIAGNOSIS — I447 Left bundle-branch block, unspecified: Secondary | ICD-10-CM | POA: Diagnosis not present

## 2021-11-13 DIAGNOSIS — F331 Major depressive disorder, recurrent, moderate: Secondary | ICD-10-CM | POA: Diagnosis not present

## 2021-11-13 DIAGNOSIS — F330 Major depressive disorder, recurrent, mild: Secondary | ICD-10-CM | POA: Diagnosis not present

## 2021-11-13 DIAGNOSIS — R918 Other nonspecific abnormal finding of lung field: Secondary | ICD-10-CM | POA: Diagnosis not present

## 2021-11-13 DIAGNOSIS — D649 Anemia, unspecified: Secondary | ICD-10-CM | POA: Diagnosis not present

## 2021-11-13 DIAGNOSIS — R296 Repeated falls: Secondary | ICD-10-CM | POA: Diagnosis not present

## 2021-11-13 DIAGNOSIS — F0390 Unspecified dementia without behavioral disturbance: Secondary | ICD-10-CM | POA: Diagnosis not present

## 2021-11-14 DIAGNOSIS — I350 Nonrheumatic aortic (valve) stenosis: Secondary | ICD-10-CM | POA: Diagnosis not present

## 2021-11-14 DIAGNOSIS — I130 Hypertensive heart and chronic kidney disease with heart failure and stage 1 through stage 4 chronic kidney disease, or unspecified chronic kidney disease: Secondary | ICD-10-CM | POA: Diagnosis not present

## 2021-11-14 DIAGNOSIS — F330 Major depressive disorder, recurrent, mild: Secondary | ICD-10-CM | POA: Diagnosis not present

## 2021-11-14 DIAGNOSIS — F419 Anxiety disorder, unspecified: Secondary | ICD-10-CM | POA: Diagnosis not present

## 2021-11-14 DIAGNOSIS — E119 Type 2 diabetes mellitus without complications: Secondary | ICD-10-CM | POA: Diagnosis not present

## 2021-11-14 DIAGNOSIS — E785 Hyperlipidemia, unspecified: Secondary | ICD-10-CM | POA: Diagnosis not present

## 2021-11-15 DIAGNOSIS — E785 Hyperlipidemia, unspecified: Secondary | ICD-10-CM | POA: Diagnosis not present

## 2021-11-15 DIAGNOSIS — F419 Anxiety disorder, unspecified: Secondary | ICD-10-CM | POA: Diagnosis not present

## 2021-11-15 DIAGNOSIS — I130 Hypertensive heart and chronic kidney disease with heart failure and stage 1 through stage 4 chronic kidney disease, or unspecified chronic kidney disease: Secondary | ICD-10-CM | POA: Diagnosis not present

## 2021-11-15 DIAGNOSIS — I350 Nonrheumatic aortic (valve) stenosis: Secondary | ICD-10-CM | POA: Diagnosis not present

## 2021-11-15 DIAGNOSIS — E119 Type 2 diabetes mellitus without complications: Secondary | ICD-10-CM | POA: Diagnosis not present

## 2021-11-15 DIAGNOSIS — F330 Major depressive disorder, recurrent, mild: Secondary | ICD-10-CM | POA: Diagnosis not present

## 2021-11-18 DIAGNOSIS — F331 Major depressive disorder, recurrent, moderate: Secondary | ICD-10-CM | POA: Diagnosis not present

## 2021-11-19 DIAGNOSIS — I350 Nonrheumatic aortic (valve) stenosis: Secondary | ICD-10-CM | POA: Diagnosis not present

## 2021-11-19 DIAGNOSIS — I130 Hypertensive heart and chronic kidney disease with heart failure and stage 1 through stage 4 chronic kidney disease, or unspecified chronic kidney disease: Secondary | ICD-10-CM | POA: Diagnosis not present

## 2021-11-19 DIAGNOSIS — F331 Major depressive disorder, recurrent, moderate: Secondary | ICD-10-CM | POA: Diagnosis not present

## 2021-11-19 DIAGNOSIS — F330 Major depressive disorder, recurrent, mild: Secondary | ICD-10-CM | POA: Diagnosis not present

## 2021-11-19 DIAGNOSIS — F0390 Unspecified dementia without behavioral disturbance: Secondary | ICD-10-CM | POA: Diagnosis not present

## 2021-11-19 DIAGNOSIS — E785 Hyperlipidemia, unspecified: Secondary | ICD-10-CM | POA: Diagnosis not present

## 2021-11-19 DIAGNOSIS — E119 Type 2 diabetes mellitus without complications: Secondary | ICD-10-CM | POA: Diagnosis not present

## 2021-11-19 DIAGNOSIS — F419 Anxiety disorder, unspecified: Secondary | ICD-10-CM | POA: Diagnosis not present

## 2021-11-20 DIAGNOSIS — F330 Major depressive disorder, recurrent, mild: Secondary | ICD-10-CM | POA: Diagnosis not present

## 2021-11-20 DIAGNOSIS — I350 Nonrheumatic aortic (valve) stenosis: Secondary | ICD-10-CM | POA: Diagnosis not present

## 2021-11-20 DIAGNOSIS — E785 Hyperlipidemia, unspecified: Secondary | ICD-10-CM | POA: Diagnosis not present

## 2021-11-20 DIAGNOSIS — I130 Hypertensive heart and chronic kidney disease with heart failure and stage 1 through stage 4 chronic kidney disease, or unspecified chronic kidney disease: Secondary | ICD-10-CM | POA: Diagnosis not present

## 2021-11-20 DIAGNOSIS — F419 Anxiety disorder, unspecified: Secondary | ICD-10-CM | POA: Diagnosis not present

## 2021-11-20 DIAGNOSIS — E119 Type 2 diabetes mellitus without complications: Secondary | ICD-10-CM | POA: Diagnosis not present

## 2021-11-25 DIAGNOSIS — I350 Nonrheumatic aortic (valve) stenosis: Secondary | ICD-10-CM | POA: Diagnosis not present

## 2021-11-25 DIAGNOSIS — I130 Hypertensive heart and chronic kidney disease with heart failure and stage 1 through stage 4 chronic kidney disease, or unspecified chronic kidney disease: Secondary | ICD-10-CM | POA: Diagnosis not present

## 2021-11-25 DIAGNOSIS — E785 Hyperlipidemia, unspecified: Secondary | ICD-10-CM | POA: Diagnosis not present

## 2021-11-25 DIAGNOSIS — E119 Type 2 diabetes mellitus without complications: Secondary | ICD-10-CM | POA: Diagnosis not present

## 2021-11-25 DIAGNOSIS — F330 Major depressive disorder, recurrent, mild: Secondary | ICD-10-CM | POA: Diagnosis not present

## 2021-11-25 DIAGNOSIS — F419 Anxiety disorder, unspecified: Secondary | ICD-10-CM | POA: Diagnosis not present

## 2021-11-27 DIAGNOSIS — E119 Type 2 diabetes mellitus without complications: Secondary | ICD-10-CM | POA: Diagnosis not present

## 2021-11-27 DIAGNOSIS — E785 Hyperlipidemia, unspecified: Secondary | ICD-10-CM | POA: Diagnosis not present

## 2021-11-27 DIAGNOSIS — I350 Nonrheumatic aortic (valve) stenosis: Secondary | ICD-10-CM | POA: Diagnosis not present

## 2021-11-27 DIAGNOSIS — F419 Anxiety disorder, unspecified: Secondary | ICD-10-CM | POA: Diagnosis not present

## 2021-11-27 DIAGNOSIS — F330 Major depressive disorder, recurrent, mild: Secondary | ICD-10-CM | POA: Diagnosis not present

## 2021-11-27 DIAGNOSIS — I130 Hypertensive heart and chronic kidney disease with heart failure and stage 1 through stage 4 chronic kidney disease, or unspecified chronic kidney disease: Secondary | ICD-10-CM | POA: Diagnosis not present

## 2021-11-29 DIAGNOSIS — F330 Major depressive disorder, recurrent, mild: Secondary | ICD-10-CM | POA: Diagnosis not present

## 2021-11-29 DIAGNOSIS — F419 Anxiety disorder, unspecified: Secondary | ICD-10-CM | POA: Diagnosis not present

## 2021-11-29 DIAGNOSIS — E785 Hyperlipidemia, unspecified: Secondary | ICD-10-CM | POA: Diagnosis not present

## 2021-11-29 DIAGNOSIS — E119 Type 2 diabetes mellitus without complications: Secondary | ICD-10-CM | POA: Diagnosis not present

## 2021-11-29 DIAGNOSIS — I130 Hypertensive heart and chronic kidney disease with heart failure and stage 1 through stage 4 chronic kidney disease, or unspecified chronic kidney disease: Secondary | ICD-10-CM | POA: Diagnosis not present

## 2021-11-29 DIAGNOSIS — I350 Nonrheumatic aortic (valve) stenosis: Secondary | ICD-10-CM | POA: Diagnosis not present

## 2021-12-03 DIAGNOSIS — F330 Major depressive disorder, recurrent, mild: Secondary | ICD-10-CM | POA: Diagnosis not present

## 2021-12-03 DIAGNOSIS — I350 Nonrheumatic aortic (valve) stenosis: Secondary | ICD-10-CM | POA: Diagnosis not present

## 2021-12-03 DIAGNOSIS — I130 Hypertensive heart and chronic kidney disease with heart failure and stage 1 through stage 4 chronic kidney disease, or unspecified chronic kidney disease: Secondary | ICD-10-CM | POA: Diagnosis not present

## 2021-12-03 DIAGNOSIS — F419 Anxiety disorder, unspecified: Secondary | ICD-10-CM | POA: Diagnosis not present

## 2021-12-03 DIAGNOSIS — E785 Hyperlipidemia, unspecified: Secondary | ICD-10-CM | POA: Diagnosis not present

## 2021-12-03 DIAGNOSIS — E119 Type 2 diabetes mellitus without complications: Secondary | ICD-10-CM | POA: Diagnosis not present

## 2021-12-04 DIAGNOSIS — F419 Anxiety disorder, unspecified: Secondary | ICD-10-CM | POA: Diagnosis not present

## 2021-12-04 DIAGNOSIS — E785 Hyperlipidemia, unspecified: Secondary | ICD-10-CM | POA: Diagnosis not present

## 2021-12-04 DIAGNOSIS — F330 Major depressive disorder, recurrent, mild: Secondary | ICD-10-CM | POA: Diagnosis not present

## 2021-12-04 DIAGNOSIS — I350 Nonrheumatic aortic (valve) stenosis: Secondary | ICD-10-CM | POA: Diagnosis not present

## 2021-12-04 DIAGNOSIS — E119 Type 2 diabetes mellitus without complications: Secondary | ICD-10-CM | POA: Diagnosis not present

## 2021-12-04 DIAGNOSIS — I130 Hypertensive heart and chronic kidney disease with heart failure and stage 1 through stage 4 chronic kidney disease, or unspecified chronic kidney disease: Secondary | ICD-10-CM | POA: Diagnosis not present

## 2021-12-06 DIAGNOSIS — E119 Type 2 diabetes mellitus without complications: Secondary | ICD-10-CM | POA: Diagnosis not present

## 2021-12-06 DIAGNOSIS — E785 Hyperlipidemia, unspecified: Secondary | ICD-10-CM | POA: Diagnosis not present

## 2021-12-06 DIAGNOSIS — F330 Major depressive disorder, recurrent, mild: Secondary | ICD-10-CM | POA: Diagnosis not present

## 2021-12-06 DIAGNOSIS — I130 Hypertensive heart and chronic kidney disease with heart failure and stage 1 through stage 4 chronic kidney disease, or unspecified chronic kidney disease: Secondary | ICD-10-CM | POA: Diagnosis not present

## 2021-12-06 DIAGNOSIS — F419 Anxiety disorder, unspecified: Secondary | ICD-10-CM | POA: Diagnosis not present

## 2021-12-06 DIAGNOSIS — I350 Nonrheumatic aortic (valve) stenosis: Secondary | ICD-10-CM | POA: Diagnosis not present

## 2021-12-09 DIAGNOSIS — F330 Major depressive disorder, recurrent, mild: Secondary | ICD-10-CM | POA: Diagnosis not present

## 2021-12-09 DIAGNOSIS — I350 Nonrheumatic aortic (valve) stenosis: Secondary | ICD-10-CM | POA: Diagnosis not present

## 2021-12-09 DIAGNOSIS — I130 Hypertensive heart and chronic kidney disease with heart failure and stage 1 through stage 4 chronic kidney disease, or unspecified chronic kidney disease: Secondary | ICD-10-CM | POA: Diagnosis not present

## 2021-12-09 DIAGNOSIS — F419 Anxiety disorder, unspecified: Secondary | ICD-10-CM | POA: Diagnosis not present

## 2021-12-09 DIAGNOSIS — E785 Hyperlipidemia, unspecified: Secondary | ICD-10-CM | POA: Diagnosis not present

## 2021-12-09 DIAGNOSIS — E119 Type 2 diabetes mellitus without complications: Secondary | ICD-10-CM | POA: Diagnosis not present

## 2021-12-10 DIAGNOSIS — F419 Anxiety disorder, unspecified: Secondary | ICD-10-CM | POA: Diagnosis not present

## 2021-12-10 DIAGNOSIS — Z79899 Other long term (current) drug therapy: Secondary | ICD-10-CM | POA: Diagnosis not present

## 2021-12-10 DIAGNOSIS — E119 Type 2 diabetes mellitus without complications: Secondary | ICD-10-CM | POA: Diagnosis not present

## 2021-12-10 DIAGNOSIS — N3281 Overactive bladder: Secondary | ICD-10-CM | POA: Diagnosis not present

## 2021-12-10 DIAGNOSIS — F330 Major depressive disorder, recurrent, mild: Secondary | ICD-10-CM | POA: Diagnosis not present

## 2021-12-10 DIAGNOSIS — I130 Hypertensive heart and chronic kidney disease with heart failure and stage 1 through stage 4 chronic kidney disease, or unspecified chronic kidney disease: Secondary | ICD-10-CM | POA: Diagnosis not present

## 2021-12-10 DIAGNOSIS — F02C3 Dementia in other diseases classified elsewhere, severe, with mood disturbance: Secondary | ICD-10-CM | POA: Diagnosis not present

## 2021-12-10 DIAGNOSIS — E785 Hyperlipidemia, unspecified: Secondary | ICD-10-CM | POA: Diagnosis not present

## 2021-12-10 DIAGNOSIS — I350 Nonrheumatic aortic (valve) stenosis: Secondary | ICD-10-CM | POA: Diagnosis not present

## 2021-12-10 DIAGNOSIS — F331 Major depressive disorder, recurrent, moderate: Secondary | ICD-10-CM | POA: Diagnosis not present

## 2021-12-10 DIAGNOSIS — G301 Alzheimer's disease with late onset: Secondary | ICD-10-CM | POA: Diagnosis not present

## 2021-12-11 DIAGNOSIS — E119 Type 2 diabetes mellitus without complications: Secondary | ICD-10-CM | POA: Diagnosis not present

## 2021-12-11 DIAGNOSIS — I130 Hypertensive heart and chronic kidney disease with heart failure and stage 1 through stage 4 chronic kidney disease, or unspecified chronic kidney disease: Secondary | ICD-10-CM | POA: Diagnosis not present

## 2021-12-11 DIAGNOSIS — E1159 Type 2 diabetes mellitus with other circulatory complications: Secondary | ICD-10-CM | POA: Diagnosis not present

## 2021-12-11 DIAGNOSIS — Z515 Encounter for palliative care: Secondary | ICD-10-CM | POA: Diagnosis not present

## 2021-12-11 DIAGNOSIS — I447 Left bundle-branch block, unspecified: Secondary | ICD-10-CM | POA: Diagnosis not present

## 2021-12-11 DIAGNOSIS — I739 Peripheral vascular disease, unspecified: Secondary | ICD-10-CM | POA: Diagnosis not present

## 2021-12-11 DIAGNOSIS — F419 Anxiety disorder, unspecified: Secondary | ICD-10-CM | POA: Diagnosis not present

## 2021-12-11 DIAGNOSIS — I350 Nonrheumatic aortic (valve) stenosis: Secondary | ICD-10-CM | POA: Diagnosis not present

## 2021-12-11 DIAGNOSIS — F331 Major depressive disorder, recurrent, moderate: Secondary | ICD-10-CM | POA: Diagnosis not present

## 2021-12-11 DIAGNOSIS — F0390 Unspecified dementia without behavioral disturbance: Secondary | ICD-10-CM | POA: Diagnosis not present

## 2021-12-11 DIAGNOSIS — K219 Gastro-esophageal reflux disease without esophagitis: Secondary | ICD-10-CM | POA: Diagnosis not present

## 2021-12-11 DIAGNOSIS — I4891 Unspecified atrial fibrillation: Secondary | ICD-10-CM | POA: Diagnosis not present

## 2021-12-11 DIAGNOSIS — F330 Major depressive disorder, recurrent, mild: Secondary | ICD-10-CM | POA: Diagnosis not present

## 2021-12-11 DIAGNOSIS — N400 Enlarged prostate without lower urinary tract symptoms: Secondary | ICD-10-CM | POA: Diagnosis not present

## 2021-12-11 DIAGNOSIS — E785 Hyperlipidemia, unspecified: Secondary | ICD-10-CM | POA: Diagnosis not present

## 2021-12-11 DIAGNOSIS — J1282 Pneumonia due to coronavirus disease 2019: Secondary | ICD-10-CM | POA: Diagnosis not present

## 2021-12-12 DIAGNOSIS — E1159 Type 2 diabetes mellitus with other circulatory complications: Secondary | ICD-10-CM | POA: Diagnosis not present

## 2021-12-12 DIAGNOSIS — I739 Peripheral vascular disease, unspecified: Secondary | ICD-10-CM | POA: Diagnosis not present

## 2021-12-12 DIAGNOSIS — L603 Nail dystrophy: Secondary | ICD-10-CM | POA: Diagnosis not present

## 2021-12-13 DIAGNOSIS — I130 Hypertensive heart and chronic kidney disease with heart failure and stage 1 through stage 4 chronic kidney disease, or unspecified chronic kidney disease: Secondary | ICD-10-CM | POA: Diagnosis not present

## 2021-12-13 DIAGNOSIS — E119 Type 2 diabetes mellitus without complications: Secondary | ICD-10-CM | POA: Diagnosis not present

## 2021-12-13 DIAGNOSIS — F330 Major depressive disorder, recurrent, mild: Secondary | ICD-10-CM | POA: Diagnosis not present

## 2021-12-13 DIAGNOSIS — E785 Hyperlipidemia, unspecified: Secondary | ICD-10-CM | POA: Diagnosis not present

## 2021-12-13 DIAGNOSIS — F419 Anxiety disorder, unspecified: Secondary | ICD-10-CM | POA: Diagnosis not present

## 2021-12-13 DIAGNOSIS — I350 Nonrheumatic aortic (valve) stenosis: Secondary | ICD-10-CM | POA: Diagnosis not present

## 2021-12-16 DIAGNOSIS — I350 Nonrheumatic aortic (valve) stenosis: Secondary | ICD-10-CM | POA: Diagnosis not present

## 2021-12-16 DIAGNOSIS — I130 Hypertensive heart and chronic kidney disease with heart failure and stage 1 through stage 4 chronic kidney disease, or unspecified chronic kidney disease: Secondary | ICD-10-CM | POA: Diagnosis not present

## 2021-12-16 DIAGNOSIS — F330 Major depressive disorder, recurrent, mild: Secondary | ICD-10-CM | POA: Diagnosis not present

## 2021-12-16 DIAGNOSIS — F419 Anxiety disorder, unspecified: Secondary | ICD-10-CM | POA: Diagnosis not present

## 2021-12-16 DIAGNOSIS — E119 Type 2 diabetes mellitus without complications: Secondary | ICD-10-CM | POA: Diagnosis not present

## 2021-12-16 DIAGNOSIS — E785 Hyperlipidemia, unspecified: Secondary | ICD-10-CM | POA: Diagnosis not present

## 2021-12-17 DIAGNOSIS — I350 Nonrheumatic aortic (valve) stenosis: Secondary | ICD-10-CM | POA: Diagnosis not present

## 2021-12-17 DIAGNOSIS — E119 Type 2 diabetes mellitus without complications: Secondary | ICD-10-CM | POA: Diagnosis not present

## 2021-12-17 DIAGNOSIS — F419 Anxiety disorder, unspecified: Secondary | ICD-10-CM | POA: Diagnosis not present

## 2021-12-17 DIAGNOSIS — F330 Major depressive disorder, recurrent, mild: Secondary | ICD-10-CM | POA: Diagnosis not present

## 2021-12-17 DIAGNOSIS — E785 Hyperlipidemia, unspecified: Secondary | ICD-10-CM | POA: Diagnosis not present

## 2021-12-17 DIAGNOSIS — I130 Hypertensive heart and chronic kidney disease with heart failure and stage 1 through stage 4 chronic kidney disease, or unspecified chronic kidney disease: Secondary | ICD-10-CM | POA: Diagnosis not present

## 2021-12-20 DIAGNOSIS — F419 Anxiety disorder, unspecified: Secondary | ICD-10-CM | POA: Diagnosis not present

## 2021-12-20 DIAGNOSIS — F330 Major depressive disorder, recurrent, mild: Secondary | ICD-10-CM | POA: Diagnosis not present

## 2021-12-20 DIAGNOSIS — I350 Nonrheumatic aortic (valve) stenosis: Secondary | ICD-10-CM | POA: Diagnosis not present

## 2021-12-20 DIAGNOSIS — I130 Hypertensive heart and chronic kidney disease with heart failure and stage 1 through stage 4 chronic kidney disease, or unspecified chronic kidney disease: Secondary | ICD-10-CM | POA: Diagnosis not present

## 2021-12-20 DIAGNOSIS — E785 Hyperlipidemia, unspecified: Secondary | ICD-10-CM | POA: Diagnosis not present

## 2021-12-20 DIAGNOSIS — E119 Type 2 diabetes mellitus without complications: Secondary | ICD-10-CM | POA: Diagnosis not present

## 2021-12-25 DIAGNOSIS — F419 Anxiety disorder, unspecified: Secondary | ICD-10-CM | POA: Diagnosis not present

## 2021-12-25 DIAGNOSIS — E785 Hyperlipidemia, unspecified: Secondary | ICD-10-CM | POA: Diagnosis not present

## 2021-12-25 DIAGNOSIS — I130 Hypertensive heart and chronic kidney disease with heart failure and stage 1 through stage 4 chronic kidney disease, or unspecified chronic kidney disease: Secondary | ICD-10-CM | POA: Diagnosis not present

## 2021-12-25 DIAGNOSIS — E119 Type 2 diabetes mellitus without complications: Secondary | ICD-10-CM | POA: Diagnosis not present

## 2021-12-25 DIAGNOSIS — F330 Major depressive disorder, recurrent, mild: Secondary | ICD-10-CM | POA: Diagnosis not present

## 2021-12-25 DIAGNOSIS — I350 Nonrheumatic aortic (valve) stenosis: Secondary | ICD-10-CM | POA: Diagnosis not present

## 2021-12-26 DIAGNOSIS — E785 Hyperlipidemia, unspecified: Secondary | ICD-10-CM | POA: Diagnosis not present

## 2021-12-26 DIAGNOSIS — I350 Nonrheumatic aortic (valve) stenosis: Secondary | ICD-10-CM | POA: Diagnosis not present

## 2021-12-26 DIAGNOSIS — E119 Type 2 diabetes mellitus without complications: Secondary | ICD-10-CM | POA: Diagnosis not present

## 2021-12-26 DIAGNOSIS — F330 Major depressive disorder, recurrent, mild: Secondary | ICD-10-CM | POA: Diagnosis not present

## 2021-12-26 DIAGNOSIS — I130 Hypertensive heart and chronic kidney disease with heart failure and stage 1 through stage 4 chronic kidney disease, or unspecified chronic kidney disease: Secondary | ICD-10-CM | POA: Diagnosis not present

## 2021-12-26 DIAGNOSIS — F419 Anxiety disorder, unspecified: Secondary | ICD-10-CM | POA: Diagnosis not present

## 2021-12-27 DIAGNOSIS — I350 Nonrheumatic aortic (valve) stenosis: Secondary | ICD-10-CM | POA: Diagnosis not present

## 2021-12-27 DIAGNOSIS — E785 Hyperlipidemia, unspecified: Secondary | ICD-10-CM | POA: Diagnosis not present

## 2021-12-27 DIAGNOSIS — I130 Hypertensive heart and chronic kidney disease with heart failure and stage 1 through stage 4 chronic kidney disease, or unspecified chronic kidney disease: Secondary | ICD-10-CM | POA: Diagnosis not present

## 2021-12-27 DIAGNOSIS — F330 Major depressive disorder, recurrent, mild: Secondary | ICD-10-CM | POA: Diagnosis not present

## 2021-12-27 DIAGNOSIS — F419 Anxiety disorder, unspecified: Secondary | ICD-10-CM | POA: Diagnosis not present

## 2021-12-27 DIAGNOSIS — E119 Type 2 diabetes mellitus without complications: Secondary | ICD-10-CM | POA: Diagnosis not present

## 2022-01-01 DIAGNOSIS — E785 Hyperlipidemia, unspecified: Secondary | ICD-10-CM | POA: Diagnosis not present

## 2022-01-01 DIAGNOSIS — I350 Nonrheumatic aortic (valve) stenosis: Secondary | ICD-10-CM | POA: Diagnosis not present

## 2022-01-01 DIAGNOSIS — I130 Hypertensive heart and chronic kidney disease with heart failure and stage 1 through stage 4 chronic kidney disease, or unspecified chronic kidney disease: Secondary | ICD-10-CM | POA: Diagnosis not present

## 2022-01-01 DIAGNOSIS — F330 Major depressive disorder, recurrent, mild: Secondary | ICD-10-CM | POA: Diagnosis not present

## 2022-01-01 DIAGNOSIS — E119 Type 2 diabetes mellitus without complications: Secondary | ICD-10-CM | POA: Diagnosis not present

## 2022-01-01 DIAGNOSIS — F419 Anxiety disorder, unspecified: Secondary | ICD-10-CM | POA: Diagnosis not present

## 2022-01-03 DIAGNOSIS — I350 Nonrheumatic aortic (valve) stenosis: Secondary | ICD-10-CM | POA: Diagnosis not present

## 2022-01-03 DIAGNOSIS — F419 Anxiety disorder, unspecified: Secondary | ICD-10-CM | POA: Diagnosis not present

## 2022-01-03 DIAGNOSIS — E785 Hyperlipidemia, unspecified: Secondary | ICD-10-CM | POA: Diagnosis not present

## 2022-01-03 DIAGNOSIS — E119 Type 2 diabetes mellitus without complications: Secondary | ICD-10-CM | POA: Diagnosis not present

## 2022-01-03 DIAGNOSIS — I130 Hypertensive heart and chronic kidney disease with heart failure and stage 1 through stage 4 chronic kidney disease, or unspecified chronic kidney disease: Secondary | ICD-10-CM | POA: Diagnosis not present

## 2022-01-03 DIAGNOSIS — F330 Major depressive disorder, recurrent, mild: Secondary | ICD-10-CM | POA: Diagnosis not present

## 2022-01-07 DIAGNOSIS — K5901 Slow transit constipation: Secondary | ICD-10-CM | POA: Diagnosis not present

## 2022-01-07 DIAGNOSIS — G301 Alzheimer's disease with late onset: Secondary | ICD-10-CM | POA: Diagnosis not present

## 2022-01-07 DIAGNOSIS — F331 Major depressive disorder, recurrent, moderate: Secondary | ICD-10-CM | POA: Diagnosis not present

## 2022-01-07 DIAGNOSIS — F02C0 Dementia in other diseases classified elsewhere, severe, without behavioral disturbance, psychotic disturbance, mood disturbance, and anxiety: Secondary | ICD-10-CM | POA: Diagnosis not present

## 2022-01-08 DIAGNOSIS — F330 Major depressive disorder, recurrent, mild: Secondary | ICD-10-CM | POA: Diagnosis not present

## 2022-01-08 DIAGNOSIS — F419 Anxiety disorder, unspecified: Secondary | ICD-10-CM | POA: Diagnosis not present

## 2022-01-08 DIAGNOSIS — E785 Hyperlipidemia, unspecified: Secondary | ICD-10-CM | POA: Diagnosis not present

## 2022-01-08 DIAGNOSIS — I130 Hypertensive heart and chronic kidney disease with heart failure and stage 1 through stage 4 chronic kidney disease, or unspecified chronic kidney disease: Secondary | ICD-10-CM | POA: Diagnosis not present

## 2022-01-08 DIAGNOSIS — E119 Type 2 diabetes mellitus without complications: Secondary | ICD-10-CM | POA: Diagnosis not present

## 2022-01-08 DIAGNOSIS — I350 Nonrheumatic aortic (valve) stenosis: Secondary | ICD-10-CM | POA: Diagnosis not present

## 2022-01-09 ENCOUNTER — Ambulatory Visit (INDEPENDENT_AMBULATORY_CARE_PROVIDER_SITE_OTHER): Payer: Medicare Other | Admitting: Podiatry

## 2022-01-09 DIAGNOSIS — E1142 Type 2 diabetes mellitus with diabetic polyneuropathy: Secondary | ICD-10-CM | POA: Diagnosis not present

## 2022-01-09 DIAGNOSIS — B351 Tinea unguium: Secondary | ICD-10-CM | POA: Diagnosis not present

## 2022-01-09 DIAGNOSIS — E1169 Type 2 diabetes mellitus with other specified complication: Secondary | ICD-10-CM

## 2022-01-09 DIAGNOSIS — L84 Corns and callosities: Secondary | ICD-10-CM

## 2022-01-10 DIAGNOSIS — I350 Nonrheumatic aortic (valve) stenosis: Secondary | ICD-10-CM | POA: Diagnosis not present

## 2022-01-10 DIAGNOSIS — I130 Hypertensive heart and chronic kidney disease with heart failure and stage 1 through stage 4 chronic kidney disease, or unspecified chronic kidney disease: Secondary | ICD-10-CM | POA: Diagnosis not present

## 2022-01-10 DIAGNOSIS — F419 Anxiety disorder, unspecified: Secondary | ICD-10-CM | POA: Diagnosis not present

## 2022-01-10 DIAGNOSIS — F330 Major depressive disorder, recurrent, mild: Secondary | ICD-10-CM | POA: Diagnosis not present

## 2022-01-10 DIAGNOSIS — E785 Hyperlipidemia, unspecified: Secondary | ICD-10-CM | POA: Diagnosis not present

## 2022-01-10 DIAGNOSIS — E119 Type 2 diabetes mellitus without complications: Secondary | ICD-10-CM | POA: Diagnosis not present

## 2022-01-11 DIAGNOSIS — I447 Left bundle-branch block, unspecified: Secondary | ICD-10-CM | POA: Diagnosis not present

## 2022-01-11 DIAGNOSIS — Z515 Encounter for palliative care: Secondary | ICD-10-CM | POA: Diagnosis not present

## 2022-01-11 DIAGNOSIS — E119 Type 2 diabetes mellitus without complications: Secondary | ICD-10-CM | POA: Diagnosis not present

## 2022-01-11 DIAGNOSIS — F330 Major depressive disorder, recurrent, mild: Secondary | ICD-10-CM | POA: Diagnosis not present

## 2022-01-11 DIAGNOSIS — K219 Gastro-esophageal reflux disease without esophagitis: Secondary | ICD-10-CM | POA: Diagnosis not present

## 2022-01-11 DIAGNOSIS — E785 Hyperlipidemia, unspecified: Secondary | ICD-10-CM | POA: Diagnosis not present

## 2022-01-11 DIAGNOSIS — I350 Nonrheumatic aortic (valve) stenosis: Secondary | ICD-10-CM | POA: Diagnosis not present

## 2022-01-11 DIAGNOSIS — F419 Anxiety disorder, unspecified: Secondary | ICD-10-CM | POA: Diagnosis not present

## 2022-01-11 DIAGNOSIS — J1282 Pneumonia due to coronavirus disease 2019: Secondary | ICD-10-CM | POA: Diagnosis not present

## 2022-01-11 DIAGNOSIS — I130 Hypertensive heart and chronic kidney disease with heart failure and stage 1 through stage 4 chronic kidney disease, or unspecified chronic kidney disease: Secondary | ICD-10-CM | POA: Diagnosis not present

## 2022-01-11 DIAGNOSIS — I4891 Unspecified atrial fibrillation: Secondary | ICD-10-CM | POA: Diagnosis not present

## 2022-01-11 DIAGNOSIS — N400 Enlarged prostate without lower urinary tract symptoms: Secondary | ICD-10-CM | POA: Diagnosis not present

## 2022-01-13 DIAGNOSIS — I350 Nonrheumatic aortic (valve) stenosis: Secondary | ICD-10-CM | POA: Diagnosis not present

## 2022-01-13 DIAGNOSIS — F330 Major depressive disorder, recurrent, mild: Secondary | ICD-10-CM | POA: Diagnosis not present

## 2022-01-13 DIAGNOSIS — E785 Hyperlipidemia, unspecified: Secondary | ICD-10-CM | POA: Diagnosis not present

## 2022-01-13 DIAGNOSIS — F331 Major depressive disorder, recurrent, moderate: Secondary | ICD-10-CM | POA: Diagnosis not present

## 2022-01-13 DIAGNOSIS — F0390 Unspecified dementia without behavioral disturbance: Secondary | ICD-10-CM | POA: Diagnosis not present

## 2022-01-13 DIAGNOSIS — E119 Type 2 diabetes mellitus without complications: Secondary | ICD-10-CM | POA: Diagnosis not present

## 2022-01-13 DIAGNOSIS — I130 Hypertensive heart and chronic kidney disease with heart failure and stage 1 through stage 4 chronic kidney disease, or unspecified chronic kidney disease: Secondary | ICD-10-CM | POA: Diagnosis not present

## 2022-01-13 DIAGNOSIS — F419 Anxiety disorder, unspecified: Secondary | ICD-10-CM | POA: Diagnosis not present

## 2022-01-13 NOTE — Progress Notes (Signed)
Subjective: ?88 year old male presents the office today for evaluation of thick, elongated toes that he cannot trim himself as well as for a "bunion" and needs to be shaved off the bottom of his foot which is causing pain.  No swelling or redness or any drainage that he reports.  No other concerns. ? ?Objective: ?NAD ?DP/PT pulses palpable bilaterally, CRT less than 3 seconds ?Sensation decreased with Semmes Weinstein monofilament ?Nails are hypertrophic, dystrophic, brittle, discolored, elongated ?10. No surrounding redness or drainage. Tenderness nails 1-5 bilaterally.  ?Thick hyperkeratotic tissue right foot submetatarsal 5 with some dried blood present.  Upon debridement the area is preulcerative but there is no definitive skin breakdown.  There is no drainage or pus or any signs of infection.  Also thick hyperkeratotic lesion present on the plantar aspect of the foot on the arch.  No underlying ulceration or signs of infection noted to this area. ?No pain with calf compression, swelling, warmth, erythema ? ?Assessment: ?Symptomatic onychomycosis, hyperkeratotic lesions ? ?Plan: ?-All treatment options discussed with the patient including all alternatives, risks, complications.  ?-Sharply debrided nails x10 without any complications or bleeding ?-Sharply debrided the callus x2 without any complications or bleeding of the right foot.  The area submetatarsal 5 is preulcerative.  Recommended antibiotic ointment dressing changes daily as well as offloading.  If there is any issues to let me know immediately if there is any signs or symptoms of infection or skin breakdown. ?-Patient encouraged to call the office with any questions, concerns, change in symptoms.  ? ?Trula Slade DPM ? ?

## 2022-01-14 DIAGNOSIS — E119 Type 2 diabetes mellitus without complications: Secondary | ICD-10-CM | POA: Diagnosis not present

## 2022-01-14 DIAGNOSIS — F419 Anxiety disorder, unspecified: Secondary | ICD-10-CM | POA: Diagnosis not present

## 2022-01-14 DIAGNOSIS — I130 Hypertensive heart and chronic kidney disease with heart failure and stage 1 through stage 4 chronic kidney disease, or unspecified chronic kidney disease: Secondary | ICD-10-CM | POA: Diagnosis not present

## 2022-01-14 DIAGNOSIS — E785 Hyperlipidemia, unspecified: Secondary | ICD-10-CM | POA: Diagnosis not present

## 2022-01-14 DIAGNOSIS — F330 Major depressive disorder, recurrent, mild: Secondary | ICD-10-CM | POA: Diagnosis not present

## 2022-01-14 DIAGNOSIS — I350 Nonrheumatic aortic (valve) stenosis: Secondary | ICD-10-CM | POA: Diagnosis not present

## 2022-01-15 DIAGNOSIS — I350 Nonrheumatic aortic (valve) stenosis: Secondary | ICD-10-CM | POA: Diagnosis not present

## 2022-01-15 DIAGNOSIS — E785 Hyperlipidemia, unspecified: Secondary | ICD-10-CM | POA: Diagnosis not present

## 2022-01-15 DIAGNOSIS — E119 Type 2 diabetes mellitus without complications: Secondary | ICD-10-CM | POA: Diagnosis not present

## 2022-01-15 DIAGNOSIS — I130 Hypertensive heart and chronic kidney disease with heart failure and stage 1 through stage 4 chronic kidney disease, or unspecified chronic kidney disease: Secondary | ICD-10-CM | POA: Diagnosis not present

## 2022-01-15 DIAGNOSIS — F330 Major depressive disorder, recurrent, mild: Secondary | ICD-10-CM | POA: Diagnosis not present

## 2022-01-15 DIAGNOSIS — F419 Anxiety disorder, unspecified: Secondary | ICD-10-CM | POA: Diagnosis not present

## 2022-01-17 DIAGNOSIS — E785 Hyperlipidemia, unspecified: Secondary | ICD-10-CM | POA: Diagnosis not present

## 2022-01-17 DIAGNOSIS — F330 Major depressive disorder, recurrent, mild: Secondary | ICD-10-CM | POA: Diagnosis not present

## 2022-01-17 DIAGNOSIS — F419 Anxiety disorder, unspecified: Secondary | ICD-10-CM | POA: Diagnosis not present

## 2022-01-17 DIAGNOSIS — E119 Type 2 diabetes mellitus without complications: Secondary | ICD-10-CM | POA: Diagnosis not present

## 2022-01-17 DIAGNOSIS — I130 Hypertensive heart and chronic kidney disease with heart failure and stage 1 through stage 4 chronic kidney disease, or unspecified chronic kidney disease: Secondary | ICD-10-CM | POA: Diagnosis not present

## 2022-01-17 DIAGNOSIS — I350 Nonrheumatic aortic (valve) stenosis: Secondary | ICD-10-CM | POA: Diagnosis not present

## 2022-01-22 DIAGNOSIS — E785 Hyperlipidemia, unspecified: Secondary | ICD-10-CM | POA: Diagnosis not present

## 2022-01-22 DIAGNOSIS — F330 Major depressive disorder, recurrent, mild: Secondary | ICD-10-CM | POA: Diagnosis not present

## 2022-01-22 DIAGNOSIS — E119 Type 2 diabetes mellitus without complications: Secondary | ICD-10-CM | POA: Diagnosis not present

## 2022-01-22 DIAGNOSIS — I130 Hypertensive heart and chronic kidney disease with heart failure and stage 1 through stage 4 chronic kidney disease, or unspecified chronic kidney disease: Secondary | ICD-10-CM | POA: Diagnosis not present

## 2022-01-22 DIAGNOSIS — I350 Nonrheumatic aortic (valve) stenosis: Secondary | ICD-10-CM | POA: Diagnosis not present

## 2022-01-22 DIAGNOSIS — F419 Anxiety disorder, unspecified: Secondary | ICD-10-CM | POA: Diagnosis not present

## 2022-01-23 DIAGNOSIS — F330 Major depressive disorder, recurrent, mild: Secondary | ICD-10-CM | POA: Diagnosis not present

## 2022-01-23 DIAGNOSIS — I350 Nonrheumatic aortic (valve) stenosis: Secondary | ICD-10-CM | POA: Diagnosis not present

## 2022-01-23 DIAGNOSIS — F419 Anxiety disorder, unspecified: Secondary | ICD-10-CM | POA: Diagnosis not present

## 2022-01-23 DIAGNOSIS — I130 Hypertensive heart and chronic kidney disease with heart failure and stage 1 through stage 4 chronic kidney disease, or unspecified chronic kidney disease: Secondary | ICD-10-CM | POA: Diagnosis not present

## 2022-01-23 DIAGNOSIS — E785 Hyperlipidemia, unspecified: Secondary | ICD-10-CM | POA: Diagnosis not present

## 2022-01-23 DIAGNOSIS — E119 Type 2 diabetes mellitus without complications: Secondary | ICD-10-CM | POA: Diagnosis not present

## 2022-01-24 DIAGNOSIS — I350 Nonrheumatic aortic (valve) stenosis: Secondary | ICD-10-CM | POA: Diagnosis not present

## 2022-01-24 DIAGNOSIS — F419 Anxiety disorder, unspecified: Secondary | ICD-10-CM | POA: Diagnosis not present

## 2022-01-24 DIAGNOSIS — E785 Hyperlipidemia, unspecified: Secondary | ICD-10-CM | POA: Diagnosis not present

## 2022-01-24 DIAGNOSIS — F330 Major depressive disorder, recurrent, mild: Secondary | ICD-10-CM | POA: Diagnosis not present

## 2022-01-24 DIAGNOSIS — I130 Hypertensive heart and chronic kidney disease with heart failure and stage 1 through stage 4 chronic kidney disease, or unspecified chronic kidney disease: Secondary | ICD-10-CM | POA: Diagnosis not present

## 2022-01-24 DIAGNOSIS — E119 Type 2 diabetes mellitus without complications: Secondary | ICD-10-CM | POA: Diagnosis not present

## 2022-01-27 DIAGNOSIS — E785 Hyperlipidemia, unspecified: Secondary | ICD-10-CM | POA: Diagnosis not present

## 2022-01-27 DIAGNOSIS — I350 Nonrheumatic aortic (valve) stenosis: Secondary | ICD-10-CM | POA: Diagnosis not present

## 2022-01-27 DIAGNOSIS — F419 Anxiety disorder, unspecified: Secondary | ICD-10-CM | POA: Diagnosis not present

## 2022-01-27 DIAGNOSIS — F330 Major depressive disorder, recurrent, mild: Secondary | ICD-10-CM | POA: Diagnosis not present

## 2022-01-27 DIAGNOSIS — I130 Hypertensive heart and chronic kidney disease with heart failure and stage 1 through stage 4 chronic kidney disease, or unspecified chronic kidney disease: Secondary | ICD-10-CM | POA: Diagnosis not present

## 2022-01-27 DIAGNOSIS — E119 Type 2 diabetes mellitus without complications: Secondary | ICD-10-CM | POA: Diagnosis not present

## 2022-01-28 DIAGNOSIS — F419 Anxiety disorder, unspecified: Secondary | ICD-10-CM | POA: Diagnosis not present

## 2022-01-28 DIAGNOSIS — I130 Hypertensive heart and chronic kidney disease with heart failure and stage 1 through stage 4 chronic kidney disease, or unspecified chronic kidney disease: Secondary | ICD-10-CM | POA: Diagnosis not present

## 2022-01-28 DIAGNOSIS — F330 Major depressive disorder, recurrent, mild: Secondary | ICD-10-CM | POA: Diagnosis not present

## 2022-01-28 DIAGNOSIS — E119 Type 2 diabetes mellitus without complications: Secondary | ICD-10-CM | POA: Diagnosis not present

## 2022-01-28 DIAGNOSIS — I350 Nonrheumatic aortic (valve) stenosis: Secondary | ICD-10-CM | POA: Diagnosis not present

## 2022-01-28 DIAGNOSIS — E785 Hyperlipidemia, unspecified: Secondary | ICD-10-CM | POA: Diagnosis not present

## 2022-01-29 DIAGNOSIS — E785 Hyperlipidemia, unspecified: Secondary | ICD-10-CM | POA: Diagnosis not present

## 2022-01-29 DIAGNOSIS — F330 Major depressive disorder, recurrent, mild: Secondary | ICD-10-CM | POA: Diagnosis not present

## 2022-01-29 DIAGNOSIS — I130 Hypertensive heart and chronic kidney disease with heart failure and stage 1 through stage 4 chronic kidney disease, or unspecified chronic kidney disease: Secondary | ICD-10-CM | POA: Diagnosis not present

## 2022-01-29 DIAGNOSIS — E119 Type 2 diabetes mellitus without complications: Secondary | ICD-10-CM | POA: Diagnosis not present

## 2022-01-29 DIAGNOSIS — F419 Anxiety disorder, unspecified: Secondary | ICD-10-CM | POA: Diagnosis not present

## 2022-01-29 DIAGNOSIS — I350 Nonrheumatic aortic (valve) stenosis: Secondary | ICD-10-CM | POA: Diagnosis not present

## 2022-01-31 DIAGNOSIS — I130 Hypertensive heart and chronic kidney disease with heart failure and stage 1 through stage 4 chronic kidney disease, or unspecified chronic kidney disease: Secondary | ICD-10-CM | POA: Diagnosis not present

## 2022-01-31 DIAGNOSIS — F419 Anxiety disorder, unspecified: Secondary | ICD-10-CM | POA: Diagnosis not present

## 2022-01-31 DIAGNOSIS — I350 Nonrheumatic aortic (valve) stenosis: Secondary | ICD-10-CM | POA: Diagnosis not present

## 2022-01-31 DIAGNOSIS — F330 Major depressive disorder, recurrent, mild: Secondary | ICD-10-CM | POA: Diagnosis not present

## 2022-01-31 DIAGNOSIS — E785 Hyperlipidemia, unspecified: Secondary | ICD-10-CM | POA: Diagnosis not present

## 2022-01-31 DIAGNOSIS — E119 Type 2 diabetes mellitus without complications: Secondary | ICD-10-CM | POA: Diagnosis not present

## 2022-02-04 DIAGNOSIS — G301 Alzheimer's disease with late onset: Secondary | ICD-10-CM | POA: Diagnosis not present

## 2022-02-04 DIAGNOSIS — N3281 Overactive bladder: Secondary | ICD-10-CM | POA: Diagnosis not present

## 2022-02-04 DIAGNOSIS — F331 Major depressive disorder, recurrent, moderate: Secondary | ICD-10-CM | POA: Diagnosis not present

## 2022-02-04 DIAGNOSIS — F419 Anxiety disorder, unspecified: Secondary | ICD-10-CM | POA: Diagnosis not present

## 2022-02-04 DIAGNOSIS — I350 Nonrheumatic aortic (valve) stenosis: Secondary | ICD-10-CM | POA: Diagnosis not present

## 2022-02-04 DIAGNOSIS — E119 Type 2 diabetes mellitus without complications: Secondary | ICD-10-CM | POA: Diagnosis not present

## 2022-02-04 DIAGNOSIS — E785 Hyperlipidemia, unspecified: Secondary | ICD-10-CM | POA: Diagnosis not present

## 2022-02-04 DIAGNOSIS — F02C0 Dementia in other diseases classified elsewhere, severe, without behavioral disturbance, psychotic disturbance, mood disturbance, and anxiety: Secondary | ICD-10-CM | POA: Diagnosis not present

## 2022-02-04 DIAGNOSIS — F330 Major depressive disorder, recurrent, mild: Secondary | ICD-10-CM | POA: Diagnosis not present

## 2022-02-04 DIAGNOSIS — I130 Hypertensive heart and chronic kidney disease with heart failure and stage 1 through stage 4 chronic kidney disease, or unspecified chronic kidney disease: Secondary | ICD-10-CM | POA: Diagnosis not present

## 2022-02-05 DIAGNOSIS — I350 Nonrheumatic aortic (valve) stenosis: Secondary | ICD-10-CM | POA: Diagnosis not present

## 2022-02-05 DIAGNOSIS — J410 Simple chronic bronchitis: Secondary | ICD-10-CM | POA: Diagnosis not present

## 2022-02-05 DIAGNOSIS — E785 Hyperlipidemia, unspecified: Secondary | ICD-10-CM | POA: Diagnosis not present

## 2022-02-05 DIAGNOSIS — F330 Major depressive disorder, recurrent, mild: Secondary | ICD-10-CM | POA: Diagnosis not present

## 2022-02-05 DIAGNOSIS — E119 Type 2 diabetes mellitus without complications: Secondary | ICD-10-CM | POA: Diagnosis not present

## 2022-02-05 DIAGNOSIS — F419 Anxiety disorder, unspecified: Secondary | ICD-10-CM | POA: Diagnosis not present

## 2022-02-05 DIAGNOSIS — I130 Hypertensive heart and chronic kidney disease with heart failure and stage 1 through stage 4 chronic kidney disease, or unspecified chronic kidney disease: Secondary | ICD-10-CM | POA: Diagnosis not present

## 2022-02-05 DIAGNOSIS — F321 Major depressive disorder, single episode, moderate: Secondary | ICD-10-CM | POA: Diagnosis not present

## 2022-02-06 DIAGNOSIS — F330 Major depressive disorder, recurrent, mild: Secondary | ICD-10-CM | POA: Diagnosis not present

## 2022-02-06 DIAGNOSIS — E785 Hyperlipidemia, unspecified: Secondary | ICD-10-CM | POA: Diagnosis not present

## 2022-02-06 DIAGNOSIS — E119 Type 2 diabetes mellitus without complications: Secondary | ICD-10-CM | POA: Diagnosis not present

## 2022-02-06 DIAGNOSIS — I350 Nonrheumatic aortic (valve) stenosis: Secondary | ICD-10-CM | POA: Diagnosis not present

## 2022-02-06 DIAGNOSIS — I130 Hypertensive heart and chronic kidney disease with heart failure and stage 1 through stage 4 chronic kidney disease, or unspecified chronic kidney disease: Secondary | ICD-10-CM | POA: Diagnosis not present

## 2022-02-06 DIAGNOSIS — F419 Anxiety disorder, unspecified: Secondary | ICD-10-CM | POA: Diagnosis not present

## 2022-02-07 DIAGNOSIS — F419 Anxiety disorder, unspecified: Secondary | ICD-10-CM | POA: Diagnosis not present

## 2022-02-07 DIAGNOSIS — E785 Hyperlipidemia, unspecified: Secondary | ICD-10-CM | POA: Diagnosis not present

## 2022-02-07 DIAGNOSIS — F330 Major depressive disorder, recurrent, mild: Secondary | ICD-10-CM | POA: Diagnosis not present

## 2022-02-07 DIAGNOSIS — I130 Hypertensive heart and chronic kidney disease with heart failure and stage 1 through stage 4 chronic kidney disease, or unspecified chronic kidney disease: Secondary | ICD-10-CM | POA: Diagnosis not present

## 2022-02-07 DIAGNOSIS — E119 Type 2 diabetes mellitus without complications: Secondary | ICD-10-CM | POA: Diagnosis not present

## 2022-02-07 DIAGNOSIS — I350 Nonrheumatic aortic (valve) stenosis: Secondary | ICD-10-CM | POA: Diagnosis not present

## 2022-02-08 DIAGNOSIS — I130 Hypertensive heart and chronic kidney disease with heart failure and stage 1 through stage 4 chronic kidney disease, or unspecified chronic kidney disease: Secondary | ICD-10-CM | POA: Diagnosis not present

## 2022-02-08 DIAGNOSIS — F330 Major depressive disorder, recurrent, mild: Secondary | ICD-10-CM | POA: Diagnosis not present

## 2022-02-08 DIAGNOSIS — E785 Hyperlipidemia, unspecified: Secondary | ICD-10-CM | POA: Diagnosis not present

## 2022-02-08 DIAGNOSIS — E119 Type 2 diabetes mellitus without complications: Secondary | ICD-10-CM | POA: Diagnosis not present

## 2022-02-08 DIAGNOSIS — F419 Anxiety disorder, unspecified: Secondary | ICD-10-CM | POA: Diagnosis not present

## 2022-02-08 DIAGNOSIS — I350 Nonrheumatic aortic (valve) stenosis: Secondary | ICD-10-CM | POA: Diagnosis not present

## 2022-02-10 DIAGNOSIS — N3281 Overactive bladder: Secondary | ICD-10-CM | POA: Diagnosis not present

## 2022-02-10 DIAGNOSIS — F0280 Dementia in other diseases classified elsewhere without behavioral disturbance: Secondary | ICD-10-CM | POA: Diagnosis not present

## 2022-02-10 DIAGNOSIS — E785 Hyperlipidemia, unspecified: Secondary | ICD-10-CM | POA: Diagnosis not present

## 2022-02-10 DIAGNOSIS — N400 Enlarged prostate without lower urinary tract symptoms: Secondary | ICD-10-CM | POA: Diagnosis not present

## 2022-02-10 DIAGNOSIS — I130 Hypertensive heart and chronic kidney disease with heart failure and stage 1 through stage 4 chronic kidney disease, or unspecified chronic kidney disease: Secondary | ICD-10-CM | POA: Diagnosis not present

## 2022-02-10 DIAGNOSIS — E119 Type 2 diabetes mellitus without complications: Secondary | ICD-10-CM | POA: Diagnosis not present

## 2022-02-10 DIAGNOSIS — F330 Major depressive disorder, recurrent, mild: Secondary | ICD-10-CM | POA: Diagnosis not present

## 2022-02-10 DIAGNOSIS — F419 Anxiety disorder, unspecified: Secondary | ICD-10-CM | POA: Diagnosis not present

## 2022-02-10 DIAGNOSIS — J410 Simple chronic bronchitis: Secondary | ICD-10-CM | POA: Diagnosis not present

## 2022-02-10 DIAGNOSIS — G301 Alzheimer's disease with late onset: Secondary | ICD-10-CM | POA: Diagnosis not present

## 2022-02-10 DIAGNOSIS — I447 Left bundle-branch block, unspecified: Secondary | ICD-10-CM | POA: Diagnosis not present

## 2022-02-10 DIAGNOSIS — I4891 Unspecified atrial fibrillation: Secondary | ICD-10-CM | POA: Diagnosis not present

## 2022-02-10 DIAGNOSIS — R400 Somnolence: Secondary | ICD-10-CM | POA: Diagnosis not present

## 2022-02-10 DIAGNOSIS — J1282 Pneumonia due to coronavirus disease 2019: Secondary | ICD-10-CM | POA: Diagnosis not present

## 2022-02-10 DIAGNOSIS — Z515 Encounter for palliative care: Secondary | ICD-10-CM | POA: Diagnosis not present

## 2022-02-10 DIAGNOSIS — I350 Nonrheumatic aortic (valve) stenosis: Secondary | ICD-10-CM | POA: Diagnosis not present

## 2022-02-10 DIAGNOSIS — K219 Gastro-esophageal reflux disease without esophagitis: Secondary | ICD-10-CM | POA: Diagnosis not present

## 2022-02-11 DIAGNOSIS — F330 Major depressive disorder, recurrent, mild: Secondary | ICD-10-CM | POA: Diagnosis not present

## 2022-02-11 DIAGNOSIS — E785 Hyperlipidemia, unspecified: Secondary | ICD-10-CM | POA: Diagnosis not present

## 2022-02-11 DIAGNOSIS — F419 Anxiety disorder, unspecified: Secondary | ICD-10-CM | POA: Diagnosis not present

## 2022-02-11 DIAGNOSIS — I130 Hypertensive heart and chronic kidney disease with heart failure and stage 1 through stage 4 chronic kidney disease, or unspecified chronic kidney disease: Secondary | ICD-10-CM | POA: Diagnosis not present

## 2022-02-11 DIAGNOSIS — I350 Nonrheumatic aortic (valve) stenosis: Secondary | ICD-10-CM | POA: Diagnosis not present

## 2022-02-11 DIAGNOSIS — E119 Type 2 diabetes mellitus without complications: Secondary | ICD-10-CM | POA: Diagnosis not present

## 2022-02-12 DIAGNOSIS — F419 Anxiety disorder, unspecified: Secondary | ICD-10-CM | POA: Diagnosis not present

## 2022-02-12 DIAGNOSIS — I350 Nonrheumatic aortic (valve) stenosis: Secondary | ICD-10-CM | POA: Diagnosis not present

## 2022-02-12 DIAGNOSIS — F330 Major depressive disorder, recurrent, mild: Secondary | ICD-10-CM | POA: Diagnosis not present

## 2022-02-12 DIAGNOSIS — E119 Type 2 diabetes mellitus without complications: Secondary | ICD-10-CM | POA: Diagnosis not present

## 2022-02-12 DIAGNOSIS — E785 Hyperlipidemia, unspecified: Secondary | ICD-10-CM | POA: Diagnosis not present

## 2022-02-12 DIAGNOSIS — I130 Hypertensive heart and chronic kidney disease with heart failure and stage 1 through stage 4 chronic kidney disease, or unspecified chronic kidney disease: Secondary | ICD-10-CM | POA: Diagnosis not present

## 2022-02-14 DIAGNOSIS — E119 Type 2 diabetes mellitus without complications: Secondary | ICD-10-CM | POA: Diagnosis not present

## 2022-02-14 DIAGNOSIS — I130 Hypertensive heart and chronic kidney disease with heart failure and stage 1 through stage 4 chronic kidney disease, or unspecified chronic kidney disease: Secondary | ICD-10-CM | POA: Diagnosis not present

## 2022-02-14 DIAGNOSIS — F330 Major depressive disorder, recurrent, mild: Secondary | ICD-10-CM | POA: Diagnosis not present

## 2022-02-14 DIAGNOSIS — I350 Nonrheumatic aortic (valve) stenosis: Secondary | ICD-10-CM | POA: Diagnosis not present

## 2022-02-14 DIAGNOSIS — E785 Hyperlipidemia, unspecified: Secondary | ICD-10-CM | POA: Diagnosis not present

## 2022-02-14 DIAGNOSIS — F419 Anxiety disorder, unspecified: Secondary | ICD-10-CM | POA: Diagnosis not present

## 2022-02-17 DIAGNOSIS — I350 Nonrheumatic aortic (valve) stenosis: Secondary | ICD-10-CM | POA: Diagnosis not present

## 2022-02-17 DIAGNOSIS — F419 Anxiety disorder, unspecified: Secondary | ICD-10-CM | POA: Diagnosis not present

## 2022-02-17 DIAGNOSIS — I130 Hypertensive heart and chronic kidney disease with heart failure and stage 1 through stage 4 chronic kidney disease, or unspecified chronic kidney disease: Secondary | ICD-10-CM | POA: Diagnosis not present

## 2022-02-17 DIAGNOSIS — E119 Type 2 diabetes mellitus without complications: Secondary | ICD-10-CM | POA: Diagnosis not present

## 2022-02-17 DIAGNOSIS — E785 Hyperlipidemia, unspecified: Secondary | ICD-10-CM | POA: Diagnosis not present

## 2022-02-17 DIAGNOSIS — F330 Major depressive disorder, recurrent, mild: Secondary | ICD-10-CM | POA: Diagnosis not present

## 2022-02-18 DIAGNOSIS — I130 Hypertensive heart and chronic kidney disease with heart failure and stage 1 through stage 4 chronic kidney disease, or unspecified chronic kidney disease: Secondary | ICD-10-CM | POA: Diagnosis not present

## 2022-02-18 DIAGNOSIS — E119 Type 2 diabetes mellitus without complications: Secondary | ICD-10-CM | POA: Diagnosis not present

## 2022-02-18 DIAGNOSIS — I350 Nonrheumatic aortic (valve) stenosis: Secondary | ICD-10-CM | POA: Diagnosis not present

## 2022-02-18 DIAGNOSIS — F330 Major depressive disorder, recurrent, mild: Secondary | ICD-10-CM | POA: Diagnosis not present

## 2022-02-18 DIAGNOSIS — E785 Hyperlipidemia, unspecified: Secondary | ICD-10-CM | POA: Diagnosis not present

## 2022-02-18 DIAGNOSIS — F419 Anxiety disorder, unspecified: Secondary | ICD-10-CM | POA: Diagnosis not present

## 2022-02-19 DIAGNOSIS — I350 Nonrheumatic aortic (valve) stenosis: Secondary | ICD-10-CM | POA: Diagnosis not present

## 2022-02-19 DIAGNOSIS — E119 Type 2 diabetes mellitus without complications: Secondary | ICD-10-CM | POA: Diagnosis not present

## 2022-02-19 DIAGNOSIS — F419 Anxiety disorder, unspecified: Secondary | ICD-10-CM | POA: Diagnosis not present

## 2022-02-19 DIAGNOSIS — E785 Hyperlipidemia, unspecified: Secondary | ICD-10-CM | POA: Diagnosis not present

## 2022-02-19 DIAGNOSIS — F330 Major depressive disorder, recurrent, mild: Secondary | ICD-10-CM | POA: Diagnosis not present

## 2022-02-19 DIAGNOSIS — I130 Hypertensive heart and chronic kidney disease with heart failure and stage 1 through stage 4 chronic kidney disease, or unspecified chronic kidney disease: Secondary | ICD-10-CM | POA: Diagnosis not present

## 2022-02-21 DIAGNOSIS — I350 Nonrheumatic aortic (valve) stenosis: Secondary | ICD-10-CM | POA: Diagnosis not present

## 2022-02-21 DIAGNOSIS — F330 Major depressive disorder, recurrent, mild: Secondary | ICD-10-CM | POA: Diagnosis not present

## 2022-02-21 DIAGNOSIS — E119 Type 2 diabetes mellitus without complications: Secondary | ICD-10-CM | POA: Diagnosis not present

## 2022-02-21 DIAGNOSIS — E785 Hyperlipidemia, unspecified: Secondary | ICD-10-CM | POA: Diagnosis not present

## 2022-02-21 DIAGNOSIS — F419 Anxiety disorder, unspecified: Secondary | ICD-10-CM | POA: Diagnosis not present

## 2022-02-21 DIAGNOSIS — I130 Hypertensive heart and chronic kidney disease with heart failure and stage 1 through stage 4 chronic kidney disease, or unspecified chronic kidney disease: Secondary | ICD-10-CM | POA: Diagnosis not present

## 2022-02-26 DIAGNOSIS — E785 Hyperlipidemia, unspecified: Secondary | ICD-10-CM | POA: Diagnosis not present

## 2022-02-26 DIAGNOSIS — I130 Hypertensive heart and chronic kidney disease with heart failure and stage 1 through stage 4 chronic kidney disease, or unspecified chronic kidney disease: Secondary | ICD-10-CM | POA: Diagnosis not present

## 2022-02-26 DIAGNOSIS — E119 Type 2 diabetes mellitus without complications: Secondary | ICD-10-CM | POA: Diagnosis not present

## 2022-02-26 DIAGNOSIS — F419 Anxiety disorder, unspecified: Secondary | ICD-10-CM | POA: Diagnosis not present

## 2022-02-26 DIAGNOSIS — I350 Nonrheumatic aortic (valve) stenosis: Secondary | ICD-10-CM | POA: Diagnosis not present

## 2022-02-26 DIAGNOSIS — F330 Major depressive disorder, recurrent, mild: Secondary | ICD-10-CM | POA: Diagnosis not present

## 2022-02-27 DIAGNOSIS — E785 Hyperlipidemia, unspecified: Secondary | ICD-10-CM | POA: Diagnosis not present

## 2022-02-27 DIAGNOSIS — I350 Nonrheumatic aortic (valve) stenosis: Secondary | ICD-10-CM | POA: Diagnosis not present

## 2022-02-27 DIAGNOSIS — F330 Major depressive disorder, recurrent, mild: Secondary | ICD-10-CM | POA: Diagnosis not present

## 2022-02-27 DIAGNOSIS — I130 Hypertensive heart and chronic kidney disease with heart failure and stage 1 through stage 4 chronic kidney disease, or unspecified chronic kidney disease: Secondary | ICD-10-CM | POA: Diagnosis not present

## 2022-02-27 DIAGNOSIS — F0390 Unspecified dementia without behavioral disturbance: Secondary | ICD-10-CM | POA: Diagnosis not present

## 2022-02-27 DIAGNOSIS — E119 Type 2 diabetes mellitus without complications: Secondary | ICD-10-CM | POA: Diagnosis not present

## 2022-02-27 DIAGNOSIS — F419 Anxiety disorder, unspecified: Secondary | ICD-10-CM | POA: Diagnosis not present

## 2022-02-27 DIAGNOSIS — F331 Major depressive disorder, recurrent, moderate: Secondary | ICD-10-CM | POA: Diagnosis not present

## 2022-02-28 DIAGNOSIS — I350 Nonrheumatic aortic (valve) stenosis: Secondary | ICD-10-CM | POA: Diagnosis not present

## 2022-02-28 DIAGNOSIS — E785 Hyperlipidemia, unspecified: Secondary | ICD-10-CM | POA: Diagnosis not present

## 2022-02-28 DIAGNOSIS — E119 Type 2 diabetes mellitus without complications: Secondary | ICD-10-CM | POA: Diagnosis not present

## 2022-02-28 DIAGNOSIS — I130 Hypertensive heart and chronic kidney disease with heart failure and stage 1 through stage 4 chronic kidney disease, or unspecified chronic kidney disease: Secondary | ICD-10-CM | POA: Diagnosis not present

## 2022-02-28 DIAGNOSIS — F330 Major depressive disorder, recurrent, mild: Secondary | ICD-10-CM | POA: Diagnosis not present

## 2022-02-28 DIAGNOSIS — F419 Anxiety disorder, unspecified: Secondary | ICD-10-CM | POA: Diagnosis not present

## 2022-03-04 DIAGNOSIS — R300 Dysuria: Secondary | ICD-10-CM | POA: Diagnosis not present

## 2022-03-04 DIAGNOSIS — U071 COVID-19: Secondary | ICD-10-CM | POA: Diagnosis not present

## 2022-03-04 DIAGNOSIS — E1165 Type 2 diabetes mellitus with hyperglycemia: Secondary | ICD-10-CM | POA: Diagnosis not present

## 2022-03-04 DIAGNOSIS — D649 Anemia, unspecified: Secondary | ICD-10-CM | POA: Diagnosis not present

## 2022-03-04 DIAGNOSIS — F339 Major depressive disorder, recurrent, unspecified: Secondary | ICD-10-CM | POA: Diagnosis not present

## 2022-03-05 DIAGNOSIS — E119 Type 2 diabetes mellitus without complications: Secondary | ICD-10-CM | POA: Diagnosis not present

## 2022-03-05 DIAGNOSIS — I350 Nonrheumatic aortic (valve) stenosis: Secondary | ICD-10-CM | POA: Diagnosis not present

## 2022-03-05 DIAGNOSIS — E785 Hyperlipidemia, unspecified: Secondary | ICD-10-CM | POA: Diagnosis not present

## 2022-03-05 DIAGNOSIS — F419 Anxiety disorder, unspecified: Secondary | ICD-10-CM | POA: Diagnosis not present

## 2022-03-05 DIAGNOSIS — I130 Hypertensive heart and chronic kidney disease with heart failure and stage 1 through stage 4 chronic kidney disease, or unspecified chronic kidney disease: Secondary | ICD-10-CM | POA: Diagnosis not present

## 2022-03-05 DIAGNOSIS — F330 Major depressive disorder, recurrent, mild: Secondary | ICD-10-CM | POA: Diagnosis not present

## 2022-03-06 DIAGNOSIS — I350 Nonrheumatic aortic (valve) stenosis: Secondary | ICD-10-CM | POA: Diagnosis not present

## 2022-03-06 DIAGNOSIS — E785 Hyperlipidemia, unspecified: Secondary | ICD-10-CM | POA: Diagnosis not present

## 2022-03-06 DIAGNOSIS — F419 Anxiety disorder, unspecified: Secondary | ICD-10-CM | POA: Diagnosis not present

## 2022-03-06 DIAGNOSIS — E119 Type 2 diabetes mellitus without complications: Secondary | ICD-10-CM | POA: Diagnosis not present

## 2022-03-06 DIAGNOSIS — F330 Major depressive disorder, recurrent, mild: Secondary | ICD-10-CM | POA: Diagnosis not present

## 2022-03-06 DIAGNOSIS — I130 Hypertensive heart and chronic kidney disease with heart failure and stage 1 through stage 4 chronic kidney disease, or unspecified chronic kidney disease: Secondary | ICD-10-CM | POA: Diagnosis not present

## 2022-03-07 DIAGNOSIS — F419 Anxiety disorder, unspecified: Secondary | ICD-10-CM | POA: Diagnosis not present

## 2022-03-07 DIAGNOSIS — I350 Nonrheumatic aortic (valve) stenosis: Secondary | ICD-10-CM | POA: Diagnosis not present

## 2022-03-07 DIAGNOSIS — I130 Hypertensive heart and chronic kidney disease with heart failure and stage 1 through stage 4 chronic kidney disease, or unspecified chronic kidney disease: Secondary | ICD-10-CM | POA: Diagnosis not present

## 2022-03-07 DIAGNOSIS — E119 Type 2 diabetes mellitus without complications: Secondary | ICD-10-CM | POA: Diagnosis not present

## 2022-03-07 DIAGNOSIS — F330 Major depressive disorder, recurrent, mild: Secondary | ICD-10-CM | POA: Diagnosis not present

## 2022-03-07 DIAGNOSIS — E785 Hyperlipidemia, unspecified: Secondary | ICD-10-CM | POA: Diagnosis not present

## 2022-03-08 DIAGNOSIS — I350 Nonrheumatic aortic (valve) stenosis: Secondary | ICD-10-CM | POA: Diagnosis not present

## 2022-03-08 DIAGNOSIS — F330 Major depressive disorder, recurrent, mild: Secondary | ICD-10-CM | POA: Diagnosis not present

## 2022-03-08 DIAGNOSIS — I130 Hypertensive heart and chronic kidney disease with heart failure and stage 1 through stage 4 chronic kidney disease, or unspecified chronic kidney disease: Secondary | ICD-10-CM | POA: Diagnosis not present

## 2022-03-08 DIAGNOSIS — E785 Hyperlipidemia, unspecified: Secondary | ICD-10-CM | POA: Diagnosis not present

## 2022-03-08 DIAGNOSIS — E119 Type 2 diabetes mellitus without complications: Secondary | ICD-10-CM | POA: Diagnosis not present

## 2022-03-08 DIAGNOSIS — F419 Anxiety disorder, unspecified: Secondary | ICD-10-CM | POA: Diagnosis not present

## 2022-03-10 DIAGNOSIS — R627 Adult failure to thrive: Secondary | ICD-10-CM | POA: Diagnosis not present

## 2022-03-10 DIAGNOSIS — F0280 Dementia in other diseases classified elsewhere without behavioral disturbance: Secondary | ICD-10-CM | POA: Diagnosis not present

## 2022-03-10 DIAGNOSIS — K0252 Dental caries on pit and fissure surface penetrating into dentin: Secondary | ICD-10-CM | POA: Diagnosis not present

## 2022-03-10 DIAGNOSIS — G301 Alzheimer's disease with late onset: Secondary | ICD-10-CM | POA: Diagnosis not present

## 2022-03-11 DIAGNOSIS — K5900 Constipation, unspecified: Secondary | ICD-10-CM | POA: Diagnosis not present

## 2022-03-11 DIAGNOSIS — G301 Alzheimer's disease with late onset: Secondary | ICD-10-CM | POA: Diagnosis not present

## 2022-03-11 DIAGNOSIS — N3281 Overactive bladder: Secondary | ICD-10-CM | POA: Diagnosis not present

## 2022-03-11 DIAGNOSIS — F0280 Dementia in other diseases classified elsewhere without behavioral disturbance: Secondary | ICD-10-CM | POA: Diagnosis not present

## 2022-03-11 DIAGNOSIS — F339 Major depressive disorder, recurrent, unspecified: Secondary | ICD-10-CM | POA: Diagnosis not present

## 2022-03-12 DIAGNOSIS — I350 Nonrheumatic aortic (valve) stenosis: Secondary | ICD-10-CM | POA: Diagnosis not present

## 2022-03-12 DIAGNOSIS — E119 Type 2 diabetes mellitus without complications: Secondary | ICD-10-CM | POA: Diagnosis not present

## 2022-03-12 DIAGNOSIS — E785 Hyperlipidemia, unspecified: Secondary | ICD-10-CM | POA: Diagnosis not present

## 2022-03-12 DIAGNOSIS — I130 Hypertensive heart and chronic kidney disease with heart failure and stage 1 through stage 4 chronic kidney disease, or unspecified chronic kidney disease: Secondary | ICD-10-CM | POA: Diagnosis not present

## 2022-03-12 DIAGNOSIS — J410 Simple chronic bronchitis: Secondary | ICD-10-CM | POA: Diagnosis not present

## 2022-03-12 DIAGNOSIS — F339 Major depressive disorder, recurrent, unspecified: Secondary | ICD-10-CM | POA: Diagnosis not present

## 2022-03-12 DIAGNOSIS — F419 Anxiety disorder, unspecified: Secondary | ICD-10-CM | POA: Diagnosis not present

## 2022-03-12 DIAGNOSIS — F330 Major depressive disorder, recurrent, mild: Secondary | ICD-10-CM | POA: Diagnosis not present

## 2022-03-13 DIAGNOSIS — F419 Anxiety disorder, unspecified: Secondary | ICD-10-CM | POA: Diagnosis not present

## 2022-03-13 DIAGNOSIS — K219 Gastro-esophageal reflux disease without esophagitis: Secondary | ICD-10-CM | POA: Diagnosis not present

## 2022-03-13 DIAGNOSIS — I350 Nonrheumatic aortic (valve) stenosis: Secondary | ICD-10-CM | POA: Diagnosis not present

## 2022-03-13 DIAGNOSIS — I4891 Unspecified atrial fibrillation: Secondary | ICD-10-CM | POA: Diagnosis not present

## 2022-03-13 DIAGNOSIS — E785 Hyperlipidemia, unspecified: Secondary | ICD-10-CM | POA: Diagnosis not present

## 2022-03-13 DIAGNOSIS — E119 Type 2 diabetes mellitus without complications: Secondary | ICD-10-CM | POA: Diagnosis not present

## 2022-03-13 DIAGNOSIS — N400 Enlarged prostate without lower urinary tract symptoms: Secondary | ICD-10-CM | POA: Diagnosis not present

## 2022-03-13 DIAGNOSIS — I130 Hypertensive heart and chronic kidney disease with heart failure and stage 1 through stage 4 chronic kidney disease, or unspecified chronic kidney disease: Secondary | ICD-10-CM | POA: Diagnosis not present

## 2022-03-13 DIAGNOSIS — J1282 Pneumonia due to coronavirus disease 2019: Secondary | ICD-10-CM | POA: Diagnosis not present

## 2022-03-13 DIAGNOSIS — Z515 Encounter for palliative care: Secondary | ICD-10-CM | POA: Diagnosis not present

## 2022-03-13 DIAGNOSIS — I447 Left bundle-branch block, unspecified: Secondary | ICD-10-CM | POA: Diagnosis not present

## 2022-03-13 DIAGNOSIS — F330 Major depressive disorder, recurrent, mild: Secondary | ICD-10-CM | POA: Diagnosis not present

## 2022-03-14 DIAGNOSIS — F330 Major depressive disorder, recurrent, mild: Secondary | ICD-10-CM | POA: Diagnosis not present

## 2022-03-14 DIAGNOSIS — E785 Hyperlipidemia, unspecified: Secondary | ICD-10-CM | POA: Diagnosis not present

## 2022-03-14 DIAGNOSIS — I350 Nonrheumatic aortic (valve) stenosis: Secondary | ICD-10-CM | POA: Diagnosis not present

## 2022-03-14 DIAGNOSIS — I130 Hypertensive heart and chronic kidney disease with heart failure and stage 1 through stage 4 chronic kidney disease, or unspecified chronic kidney disease: Secondary | ICD-10-CM | POA: Diagnosis not present

## 2022-03-14 DIAGNOSIS — F419 Anxiety disorder, unspecified: Secondary | ICD-10-CM | POA: Diagnosis not present

## 2022-03-14 DIAGNOSIS — E119 Type 2 diabetes mellitus without complications: Secondary | ICD-10-CM | POA: Diagnosis not present

## 2022-03-17 DIAGNOSIS — I130 Hypertensive heart and chronic kidney disease with heart failure and stage 1 through stage 4 chronic kidney disease, or unspecified chronic kidney disease: Secondary | ICD-10-CM | POA: Diagnosis not present

## 2022-03-17 DIAGNOSIS — F419 Anxiety disorder, unspecified: Secondary | ICD-10-CM | POA: Diagnosis not present

## 2022-03-17 DIAGNOSIS — E785 Hyperlipidemia, unspecified: Secondary | ICD-10-CM | POA: Diagnosis not present

## 2022-03-17 DIAGNOSIS — I350 Nonrheumatic aortic (valve) stenosis: Secondary | ICD-10-CM | POA: Diagnosis not present

## 2022-03-17 DIAGNOSIS — F330 Major depressive disorder, recurrent, mild: Secondary | ICD-10-CM | POA: Diagnosis not present

## 2022-03-17 DIAGNOSIS — E119 Type 2 diabetes mellitus without complications: Secondary | ICD-10-CM | POA: Diagnosis not present

## 2022-03-19 DIAGNOSIS — I130 Hypertensive heart and chronic kidney disease with heart failure and stage 1 through stage 4 chronic kidney disease, or unspecified chronic kidney disease: Secondary | ICD-10-CM | POA: Diagnosis not present

## 2022-03-19 DIAGNOSIS — F419 Anxiety disorder, unspecified: Secondary | ICD-10-CM | POA: Diagnosis not present

## 2022-03-19 DIAGNOSIS — E785 Hyperlipidemia, unspecified: Secondary | ICD-10-CM | POA: Diagnosis not present

## 2022-03-19 DIAGNOSIS — I350 Nonrheumatic aortic (valve) stenosis: Secondary | ICD-10-CM | POA: Diagnosis not present

## 2022-03-19 DIAGNOSIS — E119 Type 2 diabetes mellitus without complications: Secondary | ICD-10-CM | POA: Diagnosis not present

## 2022-03-19 DIAGNOSIS — F330 Major depressive disorder, recurrent, mild: Secondary | ICD-10-CM | POA: Diagnosis not present

## 2022-03-21 DIAGNOSIS — E119 Type 2 diabetes mellitus without complications: Secondary | ICD-10-CM | POA: Diagnosis not present

## 2022-03-21 DIAGNOSIS — F419 Anxiety disorder, unspecified: Secondary | ICD-10-CM | POA: Diagnosis not present

## 2022-03-21 DIAGNOSIS — I130 Hypertensive heart and chronic kidney disease with heart failure and stage 1 through stage 4 chronic kidney disease, or unspecified chronic kidney disease: Secondary | ICD-10-CM | POA: Diagnosis not present

## 2022-03-21 DIAGNOSIS — E785 Hyperlipidemia, unspecified: Secondary | ICD-10-CM | POA: Diagnosis not present

## 2022-03-21 DIAGNOSIS — F330 Major depressive disorder, recurrent, mild: Secondary | ICD-10-CM | POA: Diagnosis not present

## 2022-03-21 DIAGNOSIS — I350 Nonrheumatic aortic (valve) stenosis: Secondary | ICD-10-CM | POA: Diagnosis not present

## 2022-03-26 DIAGNOSIS — I130 Hypertensive heart and chronic kidney disease with heart failure and stage 1 through stage 4 chronic kidney disease, or unspecified chronic kidney disease: Secondary | ICD-10-CM | POA: Diagnosis not present

## 2022-03-26 DIAGNOSIS — E785 Hyperlipidemia, unspecified: Secondary | ICD-10-CM | POA: Diagnosis not present

## 2022-03-26 DIAGNOSIS — F419 Anxiety disorder, unspecified: Secondary | ICD-10-CM | POA: Diagnosis not present

## 2022-03-26 DIAGNOSIS — I350 Nonrheumatic aortic (valve) stenosis: Secondary | ICD-10-CM | POA: Diagnosis not present

## 2022-03-26 DIAGNOSIS — E119 Type 2 diabetes mellitus without complications: Secondary | ICD-10-CM | POA: Diagnosis not present

## 2022-03-26 DIAGNOSIS — F330 Major depressive disorder, recurrent, mild: Secondary | ICD-10-CM | POA: Diagnosis not present

## 2022-03-28 DIAGNOSIS — I350 Nonrheumatic aortic (valve) stenosis: Secondary | ICD-10-CM | POA: Diagnosis not present

## 2022-03-28 DIAGNOSIS — E785 Hyperlipidemia, unspecified: Secondary | ICD-10-CM | POA: Diagnosis not present

## 2022-03-28 DIAGNOSIS — E119 Type 2 diabetes mellitus without complications: Secondary | ICD-10-CM | POA: Diagnosis not present

## 2022-03-28 DIAGNOSIS — F419 Anxiety disorder, unspecified: Secondary | ICD-10-CM | POA: Diagnosis not present

## 2022-03-28 DIAGNOSIS — F330 Major depressive disorder, recurrent, mild: Secondary | ICD-10-CM | POA: Diagnosis not present

## 2022-03-28 DIAGNOSIS — I130 Hypertensive heart and chronic kidney disease with heart failure and stage 1 through stage 4 chronic kidney disease, or unspecified chronic kidney disease: Secondary | ICD-10-CM | POA: Diagnosis not present

## 2022-04-01 DIAGNOSIS — F0280 Dementia in other diseases classified elsewhere without behavioral disturbance: Secondary | ICD-10-CM | POA: Diagnosis not present

## 2022-04-01 DIAGNOSIS — F339 Major depressive disorder, recurrent, unspecified: Secondary | ICD-10-CM | POA: Diagnosis not present

## 2022-04-01 DIAGNOSIS — G301 Alzheimer's disease with late onset: Secondary | ICD-10-CM | POA: Diagnosis not present

## 2022-04-01 DIAGNOSIS — Z79899 Other long term (current) drug therapy: Secondary | ICD-10-CM | POA: Diagnosis not present

## 2022-04-01 DIAGNOSIS — N3281 Overactive bladder: Secondary | ICD-10-CM | POA: Diagnosis not present

## 2022-04-02 DIAGNOSIS — E785 Hyperlipidemia, unspecified: Secondary | ICD-10-CM | POA: Diagnosis not present

## 2022-04-02 DIAGNOSIS — F330 Major depressive disorder, recurrent, mild: Secondary | ICD-10-CM | POA: Diagnosis not present

## 2022-04-02 DIAGNOSIS — E119 Type 2 diabetes mellitus without complications: Secondary | ICD-10-CM | POA: Diagnosis not present

## 2022-04-02 DIAGNOSIS — I350 Nonrheumatic aortic (valve) stenosis: Secondary | ICD-10-CM | POA: Diagnosis not present

## 2022-04-02 DIAGNOSIS — I130 Hypertensive heart and chronic kidney disease with heart failure and stage 1 through stage 4 chronic kidney disease, or unspecified chronic kidney disease: Secondary | ICD-10-CM | POA: Diagnosis not present

## 2022-04-02 DIAGNOSIS — F419 Anxiety disorder, unspecified: Secondary | ICD-10-CM | POA: Diagnosis not present

## 2022-04-03 DIAGNOSIS — F419 Anxiety disorder, unspecified: Secondary | ICD-10-CM | POA: Diagnosis not present

## 2022-04-03 DIAGNOSIS — F330 Major depressive disorder, recurrent, mild: Secondary | ICD-10-CM | POA: Diagnosis not present

## 2022-04-03 DIAGNOSIS — E785 Hyperlipidemia, unspecified: Secondary | ICD-10-CM | POA: Diagnosis not present

## 2022-04-03 DIAGNOSIS — E119 Type 2 diabetes mellitus without complications: Secondary | ICD-10-CM | POA: Diagnosis not present

## 2022-04-03 DIAGNOSIS — I350 Nonrheumatic aortic (valve) stenosis: Secondary | ICD-10-CM | POA: Diagnosis not present

## 2022-04-03 DIAGNOSIS — I130 Hypertensive heart and chronic kidney disease with heart failure and stage 1 through stage 4 chronic kidney disease, or unspecified chronic kidney disease: Secondary | ICD-10-CM | POA: Diagnosis not present

## 2022-04-04 ENCOUNTER — Emergency Department (HOSPITAL_COMMUNITY): Payer: Medicare Other

## 2022-04-04 ENCOUNTER — Emergency Department (HOSPITAL_COMMUNITY)
Admission: EM | Admit: 2022-04-04 | Discharge: 2022-04-04 | Disposition: A | Discharge status: Skilled Nursing Facility | Payer: Medicare Other | Attending: Emergency Medicine | Admitting: Emergency Medicine

## 2022-04-04 ENCOUNTER — Other Ambulatory Visit: Payer: Self-pay

## 2022-04-04 ENCOUNTER — Encounter (HOSPITAL_COMMUNITY): Payer: Self-pay

## 2022-04-04 DIAGNOSIS — R404 Transient alteration of awareness: Secondary | ICD-10-CM | POA: Diagnosis not present

## 2022-04-04 DIAGNOSIS — J690 Pneumonitis due to inhalation of food and vomit: Secondary | ICD-10-CM | POA: Insufficient documentation

## 2022-04-04 DIAGNOSIS — R410 Disorientation, unspecified: Secondary | ICD-10-CM | POA: Insufficient documentation

## 2022-04-04 DIAGNOSIS — R509 Fever, unspecified: Secondary | ICD-10-CM | POA: Diagnosis not present

## 2022-04-04 DIAGNOSIS — R4182 Altered mental status, unspecified: Secondary | ICD-10-CM | POA: Diagnosis not present

## 2022-04-04 DIAGNOSIS — F330 Major depressive disorder, recurrent, mild: Secondary | ICD-10-CM | POA: Diagnosis not present

## 2022-04-04 DIAGNOSIS — E785 Hyperlipidemia, unspecified: Secondary | ICD-10-CM | POA: Diagnosis not present

## 2022-04-04 DIAGNOSIS — I959 Hypotension, unspecified: Secondary | ICD-10-CM | POA: Diagnosis not present

## 2022-04-04 DIAGNOSIS — F419 Anxiety disorder, unspecified: Secondary | ICD-10-CM | POA: Diagnosis not present

## 2022-04-04 DIAGNOSIS — F0390 Unspecified dementia without behavioral disturbance: Secondary | ICD-10-CM | POA: Insufficient documentation

## 2022-04-04 DIAGNOSIS — R918 Other nonspecific abnormal finding of lung field: Secondary | ICD-10-CM | POA: Diagnosis not present

## 2022-04-04 DIAGNOSIS — I350 Nonrheumatic aortic (valve) stenosis: Secondary | ICD-10-CM | POA: Diagnosis not present

## 2022-04-04 DIAGNOSIS — I130 Hypertensive heart and chronic kidney disease with heart failure and stage 1 through stage 4 chronic kidney disease, or unspecified chronic kidney disease: Secondary | ICD-10-CM | POA: Diagnosis not present

## 2022-04-04 DIAGNOSIS — I700 Atherosclerosis of aorta: Secondary | ICD-10-CM | POA: Diagnosis not present

## 2022-04-04 DIAGNOSIS — S199XXA Unspecified injury of neck, initial encounter: Secondary | ICD-10-CM | POA: Diagnosis not present

## 2022-04-04 DIAGNOSIS — R102 Pelvic and perineal pain: Secondary | ICD-10-CM | POA: Diagnosis not present

## 2022-04-04 DIAGNOSIS — E119 Type 2 diabetes mellitus without complications: Secondary | ICD-10-CM | POA: Diagnosis not present

## 2022-04-04 DIAGNOSIS — J479 Bronchiectasis, uncomplicated: Secondary | ICD-10-CM | POA: Diagnosis not present

## 2022-04-04 DIAGNOSIS — J90 Pleural effusion, not elsewhere classified: Secondary | ICD-10-CM | POA: Diagnosis not present

## 2022-04-04 LAB — URINALYSIS, ROUTINE W REFLEX MICROSCOPIC
Bilirubin Urine: NEGATIVE
Glucose, UA: NEGATIVE mg/dL
Hgb urine dipstick: NEGATIVE
Ketones, ur: NEGATIVE mg/dL
Leukocytes,Ua: NEGATIVE
Nitrite: NEGATIVE
Protein, ur: NEGATIVE mg/dL
Specific Gravity, Urine: 1.018 (ref 1.005–1.030)
pH: 5 (ref 5.0–8.0)

## 2022-04-04 LAB — CBC WITH DIFFERENTIAL/PLATELET
Abs Immature Granulocytes: 0.07 10*3/uL (ref 0.00–0.07)
Basophils Absolute: 0 10*3/uL (ref 0.0–0.1)
Basophils Relative: 0 %
Eosinophils Absolute: 0.1 10*3/uL (ref 0.0–0.5)
Eosinophils Relative: 1 %
HCT: 29.8 % — ABNORMAL LOW (ref 39.0–52.0)
Hemoglobin: 9.5 g/dL — ABNORMAL LOW (ref 13.0–17.0)
Immature Granulocytes: 1 %
Lymphocytes Relative: 6 %
Lymphs Abs: 0.7 10*3/uL (ref 0.7–4.0)
MCH: 29.9 pg (ref 26.0–34.0)
MCHC: 31.9 g/dL (ref 30.0–36.0)
MCV: 93.7 fL (ref 80.0–100.0)
Monocytes Absolute: 0.8 10*3/uL (ref 0.1–1.0)
Monocytes Relative: 7 %
Neutro Abs: 9.6 10*3/uL — ABNORMAL HIGH (ref 1.7–7.7)
Neutrophils Relative %: 85 %
Platelets: 199 10*3/uL (ref 150–400)
RBC: 3.18 MIL/uL — ABNORMAL LOW (ref 4.22–5.81)
RDW: 14.2 % (ref 11.5–15.5)
WBC: 11.3 10*3/uL — ABNORMAL HIGH (ref 4.0–10.5)
nRBC: 0 % (ref 0.0–0.2)

## 2022-04-04 LAB — BASIC METABOLIC PANEL
Anion gap: 6 (ref 5–15)
BUN: 25 mg/dL — ABNORMAL HIGH (ref 8–23)
CO2: 26 mmol/L (ref 22–32)
Calcium: 8.5 mg/dL — ABNORMAL LOW (ref 8.9–10.3)
Chloride: 106 mmol/L (ref 98–111)
Creatinine, Ser: 0.87 mg/dL (ref 0.61–1.24)
GFR, Estimated: >60 mL/min (ref >60)
Glucose, Bld: 145 mg/dL — ABNORMAL HIGH (ref 70–99)
Potassium: 3.9 mmol/L (ref 3.5–5.1)
Sodium: 138 mmol/L (ref 135–145)

## 2022-04-04 MED ORDER — AMOXICILLIN-POT CLAVULANATE 875-125 MG PO TABS: 1.0000 | ORAL_TABLET | Freq: Two times a day (BID) | ORAL | 0 refills | Status: AC

## 2022-04-04 MED ORDER — SODIUM CHLORIDE 0.9 % IV SOLN: 3.0000 g | Freq: Once | INTRAVENOUS | Status: DC

## 2022-04-04 MED ORDER — SODIUM CHLORIDE 0.9 % IV BOLUS
500.0000 mL | Freq: Once | INTRAVENOUS | Status: AC
  Administered 2022-04-04: 500 mL via INTRAVENOUS

## 2022-04-04 NOTE — ED Triage Notes (Signed)
Pt bib ems from care facility for increased AMS per facility staff.  Pt hx of dementia but per ems per staff at facility pt is more altered than normal.  Pt hard of hearing.  CBG 218 by ems.

## 2022-04-04 NOTE — ED Notes (Signed)
Discharge instructions and prescription discussed w/ pt's son. Pt's son states understanding and agreement of dc instructions.  Pt assisted into wheelchair and into private vehicle.  Pt's son to drive pt back to care facility.  Yellow DNR form sent w/ son back to care facility along w/ dc instructions and prescription.

## 2022-04-05 DIAGNOSIS — I350 Nonrheumatic aortic (valve) stenosis: Secondary | ICD-10-CM | POA: Diagnosis not present

## 2022-04-05 DIAGNOSIS — E119 Type 2 diabetes mellitus without complications: Secondary | ICD-10-CM | POA: Diagnosis not present

## 2022-04-05 DIAGNOSIS — F330 Major depressive disorder, recurrent, mild: Secondary | ICD-10-CM | POA: Diagnosis not present

## 2022-04-05 DIAGNOSIS — F419 Anxiety disorder, unspecified: Secondary | ICD-10-CM | POA: Diagnosis not present

## 2022-04-05 DIAGNOSIS — E785 Hyperlipidemia, unspecified: Secondary | ICD-10-CM | POA: Diagnosis not present

## 2022-04-05 DIAGNOSIS — I130 Hypertensive heart and chronic kidney disease with heart failure and stage 1 through stage 4 chronic kidney disease, or unspecified chronic kidney disease: Secondary | ICD-10-CM | POA: Diagnosis not present

## 2022-04-06 DIAGNOSIS — F330 Major depressive disorder, recurrent, mild: Secondary | ICD-10-CM | POA: Diagnosis not present

## 2022-04-06 DIAGNOSIS — E119 Type 2 diabetes mellitus without complications: Secondary | ICD-10-CM | POA: Diagnosis not present

## 2022-04-06 DIAGNOSIS — E785 Hyperlipidemia, unspecified: Secondary | ICD-10-CM | POA: Diagnosis not present

## 2022-04-06 DIAGNOSIS — I350 Nonrheumatic aortic (valve) stenosis: Secondary | ICD-10-CM | POA: Diagnosis not present

## 2022-04-06 DIAGNOSIS — F419 Anxiety disorder, unspecified: Secondary | ICD-10-CM | POA: Diagnosis not present

## 2022-04-06 DIAGNOSIS — I130 Hypertensive heart and chronic kidney disease with heart failure and stage 1 through stage 4 chronic kidney disease, or unspecified chronic kidney disease: Secondary | ICD-10-CM | POA: Diagnosis not present

## 2022-04-07 DIAGNOSIS — F330 Major depressive disorder, recurrent, mild: Secondary | ICD-10-CM | POA: Diagnosis not present

## 2022-04-07 DIAGNOSIS — E119 Type 2 diabetes mellitus without complications: Secondary | ICD-10-CM | POA: Diagnosis not present

## 2022-04-07 DIAGNOSIS — F419 Anxiety disorder, unspecified: Secondary | ICD-10-CM | POA: Diagnosis not present

## 2022-04-07 DIAGNOSIS — I350 Nonrheumatic aortic (valve) stenosis: Secondary | ICD-10-CM | POA: Diagnosis not present

## 2022-04-07 DIAGNOSIS — I130 Hypertensive heart and chronic kidney disease with heart failure and stage 1 through stage 4 chronic kidney disease, or unspecified chronic kidney disease: Secondary | ICD-10-CM | POA: Diagnosis not present

## 2022-04-07 DIAGNOSIS — E785 Hyperlipidemia, unspecified: Secondary | ICD-10-CM | POA: Diagnosis not present

## 2022-04-08 DIAGNOSIS — G301 Alzheimer's disease with late onset: Secondary | ICD-10-CM | POA: Diagnosis not present

## 2022-04-08 DIAGNOSIS — F330 Major depressive disorder, recurrent, mild: Secondary | ICD-10-CM | POA: Diagnosis not present

## 2022-04-08 DIAGNOSIS — E785 Hyperlipidemia, unspecified: Secondary | ICD-10-CM | POA: Diagnosis not present

## 2022-04-08 DIAGNOSIS — J690 Pneumonitis due to inhalation of food and vomit: Secondary | ICD-10-CM | POA: Diagnosis not present

## 2022-04-08 DIAGNOSIS — I350 Nonrheumatic aortic (valve) stenosis: Secondary | ICD-10-CM | POA: Diagnosis not present

## 2022-04-08 DIAGNOSIS — W010XXA Fall on same level from slipping, tripping and stumbling without subsequent striking against object, initial encounter: Secondary | ICD-10-CM | POA: Diagnosis not present

## 2022-04-08 DIAGNOSIS — E119 Type 2 diabetes mellitus without complications: Secondary | ICD-10-CM | POA: Diagnosis not present

## 2022-04-08 DIAGNOSIS — F0280 Dementia in other diseases classified elsewhere without behavioral disturbance: Secondary | ICD-10-CM | POA: Diagnosis not present

## 2022-04-08 DIAGNOSIS — I130 Hypertensive heart and chronic kidney disease with heart failure and stage 1 through stage 4 chronic kidney disease, or unspecified chronic kidney disease: Secondary | ICD-10-CM | POA: Diagnosis not present

## 2022-04-08 DIAGNOSIS — F419 Anxiety disorder, unspecified: Secondary | ICD-10-CM | POA: Diagnosis not present

## 2022-04-09 DIAGNOSIS — I350 Nonrheumatic aortic (valve) stenosis: Secondary | ICD-10-CM | POA: Diagnosis not present

## 2022-04-09 DIAGNOSIS — E785 Hyperlipidemia, unspecified: Secondary | ICD-10-CM | POA: Diagnosis not present

## 2022-04-09 DIAGNOSIS — E119 Type 2 diabetes mellitus without complications: Secondary | ICD-10-CM | POA: Diagnosis not present

## 2022-04-09 DIAGNOSIS — I130 Hypertensive heart and chronic kidney disease with heart failure and stage 1 through stage 4 chronic kidney disease, or unspecified chronic kidney disease: Secondary | ICD-10-CM | POA: Diagnosis not present

## 2022-04-09 DIAGNOSIS — F330 Major depressive disorder, recurrent, mild: Secondary | ICD-10-CM | POA: Diagnosis not present

## 2022-04-09 DIAGNOSIS — F419 Anxiety disorder, unspecified: Secondary | ICD-10-CM | POA: Diagnosis not present

## 2022-04-10 DIAGNOSIS — I130 Hypertensive heart and chronic kidney disease with heart failure and stage 1 through stage 4 chronic kidney disease, or unspecified chronic kidney disease: Secondary | ICD-10-CM | POA: Diagnosis not present

## 2022-04-10 DIAGNOSIS — E119 Type 2 diabetes mellitus without complications: Secondary | ICD-10-CM | POA: Diagnosis not present

## 2022-04-10 DIAGNOSIS — I350 Nonrheumatic aortic (valve) stenosis: Secondary | ICD-10-CM | POA: Diagnosis not present

## 2022-04-10 DIAGNOSIS — E785 Hyperlipidemia, unspecified: Secondary | ICD-10-CM | POA: Diagnosis not present

## 2022-04-10 DIAGNOSIS — J690 Pneumonitis due to inhalation of food and vomit: Secondary | ICD-10-CM | POA: Diagnosis not present

## 2022-04-10 DIAGNOSIS — F330 Major depressive disorder, recurrent, mild: Secondary | ICD-10-CM | POA: Diagnosis not present

## 2022-04-10 DIAGNOSIS — F419 Anxiety disorder, unspecified: Secondary | ICD-10-CM | POA: Diagnosis not present

## 2022-04-11 DIAGNOSIS — F330 Major depressive disorder, recurrent, mild: Secondary | ICD-10-CM | POA: Diagnosis not present

## 2022-04-11 DIAGNOSIS — I350 Nonrheumatic aortic (valve) stenosis: Secondary | ICD-10-CM | POA: Diagnosis not present

## 2022-04-11 DIAGNOSIS — F419 Anxiety disorder, unspecified: Secondary | ICD-10-CM | POA: Diagnosis not present

## 2022-04-11 DIAGNOSIS — E785 Hyperlipidemia, unspecified: Secondary | ICD-10-CM | POA: Diagnosis not present

## 2022-04-11 DIAGNOSIS — I130 Hypertensive heart and chronic kidney disease with heart failure and stage 1 through stage 4 chronic kidney disease, or unspecified chronic kidney disease: Secondary | ICD-10-CM | POA: Diagnosis not present

## 2022-04-11 DIAGNOSIS — E119 Type 2 diabetes mellitus without complications: Secondary | ICD-10-CM | POA: Diagnosis not present

## 2022-04-12 DIAGNOSIS — I447 Left bundle-branch block, unspecified: Secondary | ICD-10-CM | POA: Diagnosis not present

## 2022-04-12 DIAGNOSIS — F419 Anxiety disorder, unspecified: Secondary | ICD-10-CM | POA: Diagnosis not present

## 2022-04-12 DIAGNOSIS — F330 Major depressive disorder, recurrent, mild: Secondary | ICD-10-CM | POA: Diagnosis not present

## 2022-04-12 DIAGNOSIS — E119 Type 2 diabetes mellitus without complications: Secondary | ICD-10-CM | POA: Diagnosis not present

## 2022-04-12 DIAGNOSIS — I130 Hypertensive heart and chronic kidney disease with heart failure and stage 1 through stage 4 chronic kidney disease, or unspecified chronic kidney disease: Secondary | ICD-10-CM | POA: Diagnosis not present

## 2022-04-12 DIAGNOSIS — K219 Gastro-esophageal reflux disease without esophagitis: Secondary | ICD-10-CM | POA: Diagnosis not present

## 2022-04-12 DIAGNOSIS — N400 Enlarged prostate without lower urinary tract symptoms: Secondary | ICD-10-CM | POA: Diagnosis not present

## 2022-04-12 DIAGNOSIS — Z515 Encounter for palliative care: Secondary | ICD-10-CM | POA: Diagnosis not present

## 2022-04-12 DIAGNOSIS — I350 Nonrheumatic aortic (valve) stenosis: Secondary | ICD-10-CM | POA: Diagnosis not present

## 2022-04-12 DIAGNOSIS — J1282 Pneumonia due to coronavirus disease 2019: Secondary | ICD-10-CM | POA: Diagnosis not present

## 2022-04-12 DIAGNOSIS — E785 Hyperlipidemia, unspecified: Secondary | ICD-10-CM | POA: Diagnosis not present

## 2022-04-12 DIAGNOSIS — I4891 Unspecified atrial fibrillation: Secondary | ICD-10-CM | POA: Diagnosis not present

## 2022-04-13 DIAGNOSIS — I350 Nonrheumatic aortic (valve) stenosis: Secondary | ICD-10-CM | POA: Diagnosis not present

## 2022-04-13 DIAGNOSIS — I130 Hypertensive heart and chronic kidney disease with heart failure and stage 1 through stage 4 chronic kidney disease, or unspecified chronic kidney disease: Secondary | ICD-10-CM | POA: Diagnosis not present

## 2022-04-13 DIAGNOSIS — F330 Major depressive disorder, recurrent, mild: Secondary | ICD-10-CM | POA: Diagnosis not present

## 2022-04-13 DIAGNOSIS — F419 Anxiety disorder, unspecified: Secondary | ICD-10-CM | POA: Diagnosis not present

## 2022-04-13 DIAGNOSIS — E785 Hyperlipidemia, unspecified: Secondary | ICD-10-CM | POA: Diagnosis not present

## 2022-04-13 DIAGNOSIS — E119 Type 2 diabetes mellitus without complications: Secondary | ICD-10-CM | POA: Diagnosis not present

## 2022-04-14 DIAGNOSIS — I130 Hypertensive heart and chronic kidney disease with heart failure and stage 1 through stage 4 chronic kidney disease, or unspecified chronic kidney disease: Secondary | ICD-10-CM | POA: Diagnosis not present

## 2022-04-14 DIAGNOSIS — I350 Nonrheumatic aortic (valve) stenosis: Secondary | ICD-10-CM | POA: Diagnosis not present

## 2022-04-14 DIAGNOSIS — F330 Major depressive disorder, recurrent, mild: Secondary | ICD-10-CM | POA: Diagnosis not present

## 2022-04-14 DIAGNOSIS — E119 Type 2 diabetes mellitus without complications: Secondary | ICD-10-CM | POA: Diagnosis not present

## 2022-04-14 DIAGNOSIS — E785 Hyperlipidemia, unspecified: Secondary | ICD-10-CM | POA: Diagnosis not present

## 2022-04-14 DIAGNOSIS — F419 Anxiety disorder, unspecified: Secondary | ICD-10-CM | POA: Diagnosis not present

## 2022-04-15 DIAGNOSIS — I130 Hypertensive heart and chronic kidney disease with heart failure and stage 1 through stage 4 chronic kidney disease, or unspecified chronic kidney disease: Secondary | ICD-10-CM | POA: Diagnosis not present

## 2022-04-15 DIAGNOSIS — F330 Major depressive disorder, recurrent, mild: Secondary | ICD-10-CM | POA: Diagnosis not present

## 2022-04-15 DIAGNOSIS — E119 Type 2 diabetes mellitus without complications: Secondary | ICD-10-CM | POA: Diagnosis not present

## 2022-04-15 DIAGNOSIS — I350 Nonrheumatic aortic (valve) stenosis: Secondary | ICD-10-CM | POA: Diagnosis not present

## 2022-04-15 DIAGNOSIS — F0280 Dementia in other diseases classified elsewhere without behavioral disturbance: Secondary | ICD-10-CM | POA: Diagnosis not present

## 2022-04-15 DIAGNOSIS — E785 Hyperlipidemia, unspecified: Secondary | ICD-10-CM | POA: Diagnosis not present

## 2022-04-15 DIAGNOSIS — F5101 Primary insomnia: Secondary | ICD-10-CM | POA: Diagnosis not present

## 2022-04-15 DIAGNOSIS — G301 Alzheimer's disease with late onset: Secondary | ICD-10-CM | POA: Diagnosis not present

## 2022-04-15 DIAGNOSIS — F419 Anxiety disorder, unspecified: Secondary | ICD-10-CM | POA: Diagnosis not present

## 2022-05-13 DEATH — deceased

## 2023-06-15 IMAGING — DX DG CHEST 1V PORT
1 series · 1 of 1 positions shown · non-contrast
Comparison: March 06, 2021

CLINICAL DATA: Fall.  Acute mental status change.

EXAM:
PORTABLE CHEST 1 VIEW

[chest ap]
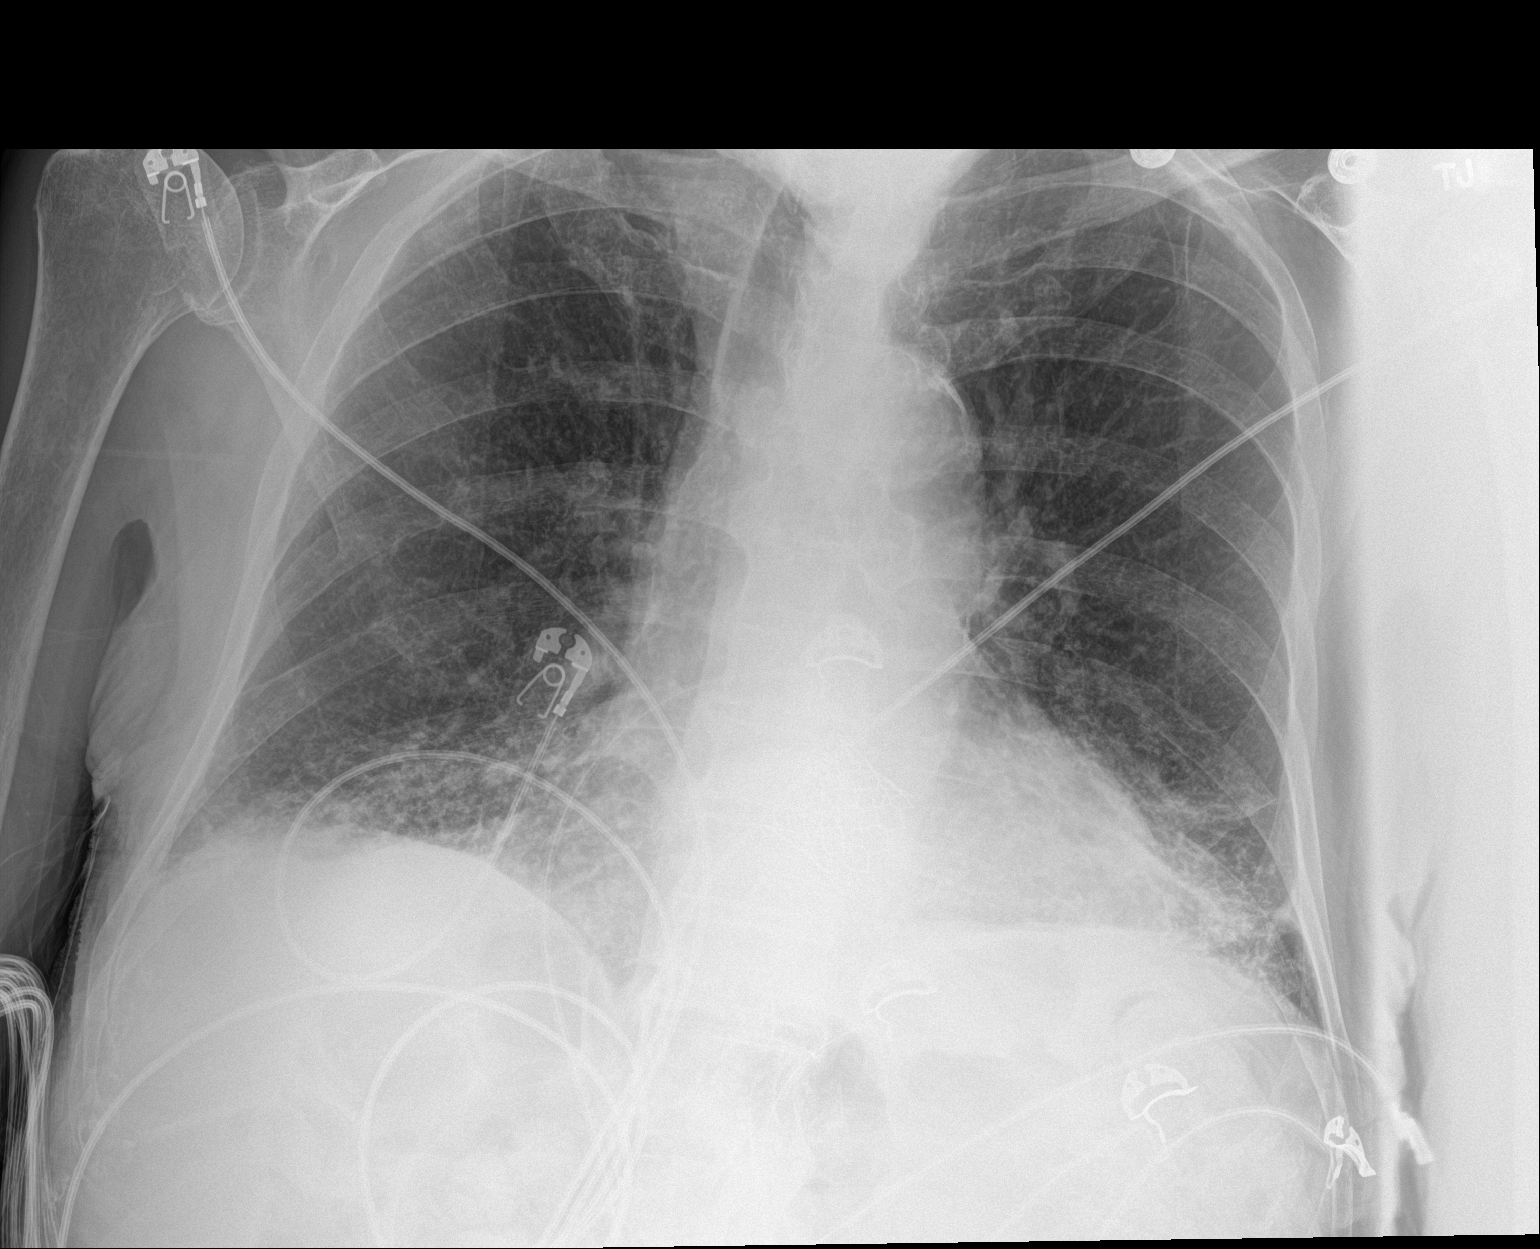

[1 of 1 positions shown; findings below may reference images not displayed]

FINDINGS: Chronic opacity at the right base. Mild increased opacity at the
left base. Cardiomediastinal silhouette is normal. No pneumothorax.
No nodules or masses. No other acute abnormalities.
IMPRESSION: 1. Increasing opacity at the left base could represent pneumonia or
aspiration in the appropriate clinical setting.
2. Chronic opacity in the right base is nonspecific but nonacute.
